# Patient Record
Sex: Female | Born: 1935 | Race: White | Hispanic: No | Smoking: Former smoker
Health system: Southern US, Community
[De-identification: ages and names within clinical notes are randomized; demographics above are authoritative.]

## PROBLEM LIST (undated history)

## (undated) DIAGNOSIS — J449 Chronic obstructive pulmonary disease, unspecified: Secondary | ICD-10-CM

## (undated) DIAGNOSIS — E785 Hyperlipidemia, unspecified: Secondary | ICD-10-CM

## (undated) DIAGNOSIS — J45909 Unspecified asthma, uncomplicated: Secondary | ICD-10-CM

## (undated) DIAGNOSIS — W19XXXA Unspecified fall, initial encounter: Secondary | ICD-10-CM

## (undated) DIAGNOSIS — I2510 Atherosclerotic heart disease of native coronary artery without angina pectoris: Secondary | ICD-10-CM

## (undated) DIAGNOSIS — I200 Unstable angina: Secondary | ICD-10-CM

## (undated) DIAGNOSIS — E039 Hypothyroidism, unspecified: Secondary | ICD-10-CM

## (undated) DIAGNOSIS — I509 Heart failure, unspecified: Secondary | ICD-10-CM

## (undated) DIAGNOSIS — R451 Restlessness and agitation: Secondary | ICD-10-CM

## (undated) HISTORY — PX: APPENDECTOMY: SHX54

## (undated) HISTORY — PX: EYE SURGERY: SHX253

## (undated) HISTORY — PX: ABDOMINAL HYSTERECTOMY: SHX81

## (undated) HISTORY — PX: ANKLE SURGERY: SHX546

---

## 2004-03-06 ENCOUNTER — Inpatient Hospital Stay: Payer: Self-pay | Admitting: Anesthesiology

## 2004-09-04 ENCOUNTER — Inpatient Hospital Stay: Payer: Self-pay | Admitting: Internal Medicine

## 2005-02-18 ENCOUNTER — Ambulatory Visit: Payer: Self-pay | Admitting: Podiatry

## 2005-11-02 ENCOUNTER — Inpatient Hospital Stay: Payer: Self-pay | Admitting: Internal Medicine

## 2005-11-02 ENCOUNTER — Other Ambulatory Visit: Payer: Self-pay

## 2007-02-21 ENCOUNTER — Ambulatory Visit: Payer: Self-pay | Admitting: Ophthalmology

## 2007-03-28 ENCOUNTER — Ambulatory Visit: Payer: Self-pay | Admitting: Ophthalmology

## 2008-01-12 ENCOUNTER — Inpatient Hospital Stay: Payer: Self-pay | Admitting: Internal Medicine

## 2008-11-12 ENCOUNTER — Ambulatory Visit: Payer: Self-pay | Admitting: Family Medicine

## 2008-11-17 ENCOUNTER — Inpatient Hospital Stay: Payer: Self-pay | Admitting: Internal Medicine

## 2009-11-24 ENCOUNTER — Inpatient Hospital Stay: Payer: Self-pay | Admitting: Internal Medicine

## 2009-12-12 ENCOUNTER — Ambulatory Visit: Payer: Self-pay | Admitting: Cardiovascular Disease

## 2010-12-30 ENCOUNTER — Observation Stay: Payer: Self-pay | Admitting: Internal Medicine

## 2011-01-11 ENCOUNTER — Inpatient Hospital Stay: Payer: Self-pay | Admitting: Internal Medicine

## 2011-01-14 LAB — WBC: WBC: 12.7 10*3/uL — ABNORMAL HIGH (ref 3.6–11.0)

## 2011-05-11 LAB — HM DEXA SCAN

## 2011-05-27 ENCOUNTER — Inpatient Hospital Stay: Payer: Self-pay | Admitting: Internal Medicine

## 2011-05-27 LAB — BASIC METABOLIC PANEL
Anion Gap: 7 (ref 7–16)
BUN: 11 mg/dL (ref 7–18)
Calcium, Total: 8.8 mg/dL (ref 8.5–10.1)
Chloride: 107 mmol/L (ref 98–107)
Creatinine: 0.71 mg/dL (ref 0.60–1.30)
EGFR (African American): >60
EGFR (Non-African Amer.): >60
Osmolality: 282 (ref 275–301)
Sodium: 142 mmol/L (ref 136–145)

## 2011-05-27 LAB — CK TOTAL AND CKMB (NOT AT ARMC): CK, Total: 145 U/L (ref 21–215)

## 2011-05-27 LAB — CBC
HCT: 43.4 % (ref 35.0–47.0)
MCH: 28.4 pg (ref 26.0–34.0)
MCV: 86 fL (ref 80–100)
Platelet: 284 10*3/uL (ref 150–440)
RBC: 5.07 10*6/uL (ref 3.80–5.20)
RDW: 13.4 % (ref 11.5–14.5)

## 2011-05-27 LAB — TROPONIN I: Troponin-I: <0.02 ng/mL

## 2011-05-28 LAB — CBC WITH DIFFERENTIAL/PLATELET
Eosinophil #: 0 10*3/uL (ref 0.0–0.7)
HCT: 41.7 % (ref 35.0–47.0)
Lymphocyte #: 0.5 10*3/uL — ABNORMAL LOW (ref 1.0–3.6)
Lymphocyte %: 6.3 %
MCH: 28.3 pg (ref 26.0–34.0)
Neutrophil #: 7.7 10*3/uL — ABNORMAL HIGH (ref 1.4–6.5)
Neutrophil %: 92.7 %
Platelet: 298 10*3/uL (ref 150–440)
RBC: 4.87 10*6/uL (ref 3.80–5.20)
WBC: 8.3 10*3/uL (ref 3.6–11.0)

## 2011-05-28 LAB — BASIC METABOLIC PANEL
Anion Gap: 8 (ref 7–16)
BUN: 11 mg/dL (ref 7–18)
Calcium, Total: 8.6 mg/dL (ref 8.5–10.1)
Co2: 27 mmol/L (ref 21–32)
Creatinine: 0.83 mg/dL (ref 0.60–1.30)
EGFR (African American): >60
Glucose: 165 mg/dL — ABNORMAL HIGH (ref 65–99)
Sodium: 142 mmol/L (ref 136–145)

## 2011-06-02 LAB — CULTURE, BLOOD (SINGLE)

## 2011-08-13 ENCOUNTER — Emergency Department: Payer: Self-pay | Admitting: Emergency Medicine

## 2011-08-13 LAB — COMPREHENSIVE METABOLIC PANEL
Alkaline Phosphatase: 86 U/L (ref 50–136)
Anion Gap: 10 (ref 7–16)
Calcium, Total: 9.1 mg/dL (ref 8.5–10.1)
Chloride: 107 mmol/L (ref 98–107)
Co2: 25 mmol/L (ref 21–32)
Creatinine: 0.86 mg/dL (ref 0.60–1.30)
EGFR (African American): >60
EGFR (Non-African Amer.): >60
Osmolality: 282 (ref 275–301)
Potassium: 3.1 mmol/L — ABNORMAL LOW (ref 3.5–5.1)
Sodium: 142 mmol/L (ref 136–145)

## 2011-08-13 LAB — CBC
HGB: 13.5 g/dL (ref 12.0–16.0)
MCH: 28.5 pg (ref 26.0–34.0)
Platelet: 278 10*3/uL (ref 150–440)
RBC: 4.75 10*6/uL (ref 3.80–5.20)
WBC: 10.9 10*3/uL (ref 3.6–11.0)

## 2012-02-27 ENCOUNTER — Inpatient Hospital Stay: Payer: Self-pay | Admitting: Specialist

## 2012-02-27 LAB — COMPREHENSIVE METABOLIC PANEL
Albumin: 3.6 g/dL (ref 3.4–5.0)
Alkaline Phosphatase: 112 U/L (ref 50–136)
Bilirubin,Total: 0.6 mg/dL (ref 0.2–1.0)
Calcium, Total: 9.2 mg/dL (ref 8.5–10.1)
Co2: 28 mmol/L (ref 21–32)
Creatinine: 0.88 mg/dL (ref 0.60–1.30)
EGFR (African American): >60
Glucose: 86 mg/dL (ref 65–99)
Potassium: 3.6 mmol/L (ref 3.5–5.1)
SGPT (ALT): 16 U/L (ref 12–78)

## 2012-02-27 LAB — TROPONIN I: Troponin-I: <0.02 ng/mL

## 2012-02-27 LAB — PRO B NATRIURETIC PEPTIDE: B-Type Natriuretic Peptide: 484 pg/mL — ABNORMAL HIGH (ref 0–125)

## 2012-02-27 LAB — CBC
HCT: 44.3 % (ref 35.0–47.0)
HGB: 14.6 g/dL (ref 12.0–16.0)
MCH: 27.2 pg (ref 26.0–34.0)
MCV: 83 fL (ref 80–100)
Platelet: 329 10*3/uL (ref 150–440)
WBC: 16.4 10*3/uL — ABNORMAL HIGH (ref 3.6–11.0)

## 2012-02-27 LAB — CK TOTAL AND CKMB (NOT AT ARMC)
CK, Total: 57 U/L (ref 21–215)
CK-MB: 0.5 ng/mL (ref 0.5–3.6)

## 2012-02-28 DIAGNOSIS — I509 Heart failure, unspecified: Secondary | ICD-10-CM

## 2012-02-28 LAB — CBC WITH DIFFERENTIAL/PLATELET
Basophil #: 0 10*3/uL (ref 0.0–0.1)
Basophil %: 0.2 %
Eosinophil %: 0 %
HCT: 38.6 % (ref 35.0–47.0)
Lymphocyte #: 0.9 10*3/uL — ABNORMAL LOW (ref 1.0–3.6)
Lymphocyte %: 4.7 %
MCHC: 32.5 g/dL (ref 32.0–36.0)
MCV: 82 fL (ref 80–100)
Monocyte %: 2.4 %
Neutrophil #: 17.6 10*3/uL — ABNORMAL HIGH (ref 1.4–6.5)
Neutrophil %: 92.7 %
Platelet: 293 10*3/uL (ref 150–440)
RBC: 4.73 10*6/uL (ref 3.80–5.20)
RDW: 14.6 % — ABNORMAL HIGH (ref 11.5–14.5)

## 2012-02-28 LAB — BASIC METABOLIC PANEL
BUN: 18 mg/dL (ref 7–18)
Calcium, Total: 9.1 mg/dL (ref 8.5–10.1)
Chloride: 101 mmol/L (ref 98–107)
Co2: 27 mmol/L (ref 21–32)
Creatinine: 0.86 mg/dL (ref 0.60–1.30)
Sodium: 137 mmol/L (ref 136–145)

## 2012-03-01 LAB — CBC WITH DIFFERENTIAL/PLATELET
Basophil %: 0.2 %
Eosinophil #: 0 10*3/uL (ref 0.0–0.7)
Eosinophil %: 0 %
HGB: 12.7 g/dL (ref 12.0–16.0)
Lymphocyte %: 6.8 %
MCH: 27.1 pg (ref 26.0–34.0)
MCHC: 32.7 g/dL (ref 32.0–36.0)
MCV: 83 fL (ref 80–100)
Neutrophil #: 14.2 10*3/uL — ABNORMAL HIGH (ref 1.4–6.5)
Neutrophil %: 89.1 %
Platelet: 357 10*3/uL (ref 150–440)

## 2012-11-01 ENCOUNTER — Inpatient Hospital Stay: Payer: Self-pay | Admitting: Internal Medicine

## 2012-11-01 LAB — CBC
HGB: 13.5 g/dL (ref 12.0–16.0)
MCH: 27 pg (ref 26.0–34.0)
MCHC: 32.9 g/dL (ref 32.0–36.0)
Platelet: 310 10*3/uL (ref 150–440)
RBC: 4.98 10*6/uL (ref 3.80–5.20)
RDW: 15.9 % — ABNORMAL HIGH (ref 11.5–14.5)
WBC: 18.1 10*3/uL — ABNORMAL HIGH (ref 3.6–11.0)

## 2012-11-01 LAB — URINALYSIS, COMPLETE
Bilirubin,UR: NEGATIVE
Ketone: NEGATIVE
Nitrite: NEGATIVE
Ph: 6 (ref 4.5–8.0)
WBC UR: 1 /HPF (ref 0–5)

## 2012-11-01 LAB — COMPREHENSIVE METABOLIC PANEL
Albumin: 3.3 g/dL — ABNORMAL LOW (ref 3.4–5.0)
Anion Gap: 5 — ABNORMAL LOW (ref 7–16)
Bilirubin,Total: 0.7 mg/dL (ref 0.2–1.0)
Chloride: 106 mmol/L (ref 98–107)
Glucose: 111 mg/dL — ABNORMAL HIGH (ref 65–99)
Osmolality: 276 (ref 275–301)
Potassium: 3.9 mmol/L (ref 3.5–5.1)
SGOT(AST): 39 U/L — ABNORMAL HIGH (ref 15–37)
SGPT (ALT): 25 U/L (ref 12–78)
Sodium: 138 mmol/L (ref 136–145)
Total Protein: 7.1 g/dL (ref 6.4–8.2)

## 2012-11-01 LAB — DIFFERENTIAL
Basophil #: 0.2 10*3/uL — ABNORMAL HIGH (ref 0.0–0.1)
Eosinophil #: 0.2 10*3/uL (ref 0.0–0.7)
Lymphocyte #: 3.5 10*3/uL (ref 1.0–3.6)
Lymphocyte %: 19.4 %
Monocyte %: 6.3 %
Neutrophil #: 13 10*3/uL — ABNORMAL HIGH (ref 1.4–6.5)
Neutrophil %: 71.8 %

## 2012-11-01 LAB — TROPONIN I: Troponin-I: <0.02 ng/mL

## 2012-11-01 LAB — MAGNESIUM: Magnesium: 1.7 mg/dL — ABNORMAL LOW

## 2012-11-01 LAB — CALCIUM: Calcium, Total: 8.9 mg/dL (ref 8.5–10.1)

## 2012-11-02 LAB — WBC: WBC: 13.5 10*3/uL — ABNORMAL HIGH (ref 3.6–11.0)

## 2012-11-06 LAB — CULTURE, BLOOD (SINGLE)

## 2012-12-08 ENCOUNTER — Inpatient Hospital Stay: Payer: Self-pay | Admitting: Internal Medicine

## 2012-12-08 LAB — COMPREHENSIVE METABOLIC PANEL
Alkaline Phosphatase: 75 U/L
Anion Gap: 6 — ABNORMAL LOW (ref 7–16)
BUN: 25 mg/dL — ABNORMAL HIGH (ref 7–18)
Chloride: 104 mmol/L (ref 98–107)
Co2: 32 mmol/L (ref 21–32)
EGFR (African American): >60
EGFR (Non-African Amer.): >60
Glucose: 117 mg/dL — ABNORMAL HIGH (ref 65–99)
Osmolality: 289 (ref 275–301)
Potassium: 3.3 mmol/L — ABNORMAL LOW (ref 3.5–5.1)
SGOT(AST): 25 U/L (ref 15–37)
Sodium: 142 mmol/L (ref 136–145)
Total Protein: 6.9 g/dL (ref 6.4–8.2)

## 2012-12-08 LAB — CBC WITH DIFFERENTIAL/PLATELET
Eosinophil #: 0.1 10*3/uL (ref 0.0–0.7)
HCT: 40.7 % (ref 35.0–47.0)
Lymphocyte #: 2.7 10*3/uL (ref 1.0–3.6)
MCH: 27.5 pg (ref 26.0–34.0)
MCHC: 33 g/dL (ref 32.0–36.0)
MCV: 83 fL (ref 80–100)
Monocyte %: 5.2 %
Neutrophil #: 20 10*3/uL — ABNORMAL HIGH (ref 1.4–6.5)
Neutrophil %: 82.5 %
Platelet: 381 10*3/uL (ref 150–440)
RBC: 4.88 10*6/uL (ref 3.80–5.20)
WBC: 24.3 10*3/uL — ABNORMAL HIGH (ref 3.6–11.0)

## 2012-12-08 LAB — CK TOTAL AND CKMB (NOT AT ARMC)
CK, Total: 72 U/L (ref 21–215)
CK-MB: 1.2 ng/mL (ref 0.5–3.6)

## 2012-12-08 LAB — THEOPHYLLINE LEVEL: Theophylline: <2 ug/mL — ABNORMAL LOW (ref 10.0–20.0)

## 2012-12-09 LAB — BASIC METABOLIC PANEL
BUN: 22 mg/dL — ABNORMAL HIGH (ref 7–18)
Chloride: 102 mmol/L (ref 98–107)
Co2: 32 mmol/L (ref 21–32)
Creatinine: 1.15 mg/dL (ref 0.60–1.30)
EGFR (African American): 54 — ABNORMAL LOW
EGFR (Non-African Amer.): 47 — ABNORMAL LOW
Glucose: 206 mg/dL — ABNORMAL HIGH (ref 65–99)
Osmolality: 285 (ref 275–301)
Sodium: 138 mmol/L (ref 136–145)

## 2012-12-09 LAB — CBC WITH DIFFERENTIAL/PLATELET
Basophil #: 0 10*3/uL (ref 0.0–0.1)
Basophil %: 0.1 %
Eosinophil #: 0 10*3/uL (ref 0.0–0.7)
HGB: 12.2 g/dL (ref 12.0–16.0)
Lymphocyte #: 1 10*3/uL (ref 1.0–3.6)
MCHC: 33.4 g/dL (ref 32.0–36.0)
Monocyte #: 1.1 x10 3/mm — ABNORMAL HIGH (ref 0.2–0.9)
Monocyte %: 3.6 %
Neutrophil %: 92.8 %
RBC: 4.45 10*6/uL (ref 3.80–5.20)
RDW: 15.8 % — ABNORMAL HIGH (ref 11.5–14.5)
WBC: 29.9 10*3/uL — ABNORMAL HIGH (ref 3.6–11.0)

## 2012-12-10 LAB — WBC: WBC: 24.5 10*3/uL — ABNORMAL HIGH (ref 3.6–11.0)

## 2012-12-10 LAB — POTASSIUM: Potassium: 3 mmol/L — ABNORMAL LOW (ref 3.5–5.1)

## 2012-12-11 LAB — CREATININE, SERUM
Creatinine: 1.13 mg/dL (ref 0.60–1.30)
EGFR (African American): 55 — ABNORMAL LOW

## 2012-12-11 LAB — POTASSIUM: Potassium: 4 mmol/L (ref 3.5–5.1)

## 2012-12-13 LAB — EXPECTORATED SPUTUM ASSESSMENT W GRAM STAIN, RFLX TO RESP C

## 2012-12-13 LAB — CULTURE, BLOOD (SINGLE)

## 2012-12-14 ENCOUNTER — Ambulatory Visit: Payer: Self-pay | Admitting: Internal Medicine

## 2013-02-05 ENCOUNTER — Observation Stay: Payer: Self-pay | Admitting: Internal Medicine

## 2013-02-05 LAB — BASIC METABOLIC PANEL
Anion Gap: 3 — ABNORMAL LOW (ref 7–16)
BUN: 18 mg/dL (ref 7–18)
CHLORIDE: 104 mmol/L (ref 98–107)
CO2: 32 mmol/L (ref 21–32)
Calcium, Total: 8.8 mg/dL (ref 8.5–10.1)
Creatinine: 0.83 mg/dL (ref 0.60–1.30)
EGFR (Non-African Amer.): >60
Glucose: 205 mg/dL — ABNORMAL HIGH (ref 65–99)
Osmolality: 285 (ref 275–301)
Potassium: 3.7 mmol/L (ref 3.5–5.1)
SODIUM: 139 mmol/L (ref 136–145)

## 2013-02-05 LAB — CBC
HCT: 37.9 % (ref 35.0–47.0)
HGB: 12.5 g/dL (ref 12.0–16.0)
MCH: 27.8 pg (ref 26.0–34.0)
MCHC: 33 g/dL (ref 32.0–36.0)
MCV: 84 fL (ref 80–100)
Platelet: 339 10*3/uL (ref 150–440)
RBC: 4.51 10*6/uL (ref 3.80–5.20)
RDW: 16 % — ABNORMAL HIGH (ref 11.5–14.5)
WBC: 9.3 10*3/uL (ref 3.6–11.0)

## 2013-02-05 LAB — TROPONIN I: Troponin-I: <0.02 ng/mL

## 2013-02-06 LAB — BASIC METABOLIC PANEL
Anion Gap: 7 (ref 7–16)
BUN: 16 mg/dL (ref 7–18)
CALCIUM: 9 mg/dL (ref 8.5–10.1)
CREATININE: 0.91 mg/dL (ref 0.60–1.30)
Chloride: 105 mmol/L (ref 98–107)
Co2: 29 mmol/L (ref 21–32)
EGFR (African American): >60
GLUCOSE: 121 mg/dL — AB (ref 65–99)
OSMOLALITY: 284 (ref 275–301)
POTASSIUM: 3.6 mmol/L (ref 3.5–5.1)
Sodium: 141 mmol/L (ref 136–145)

## 2013-02-06 LAB — CBC WITH DIFFERENTIAL/PLATELET
Basophil #: 0 10*3/uL (ref 0.0–0.1)
Basophil %: 0.1 %
EOS ABS: 0 10*3/uL (ref 0.0–0.7)
EOS PCT: 0 %
HCT: 35.5 % (ref 35.0–47.0)
HGB: 11.8 g/dL — AB (ref 12.0–16.0)
Lymphocyte #: 1 10*3/uL (ref 1.0–3.6)
Lymphocyte %: 10.6 %
MCH: 27.8 pg (ref 26.0–34.0)
MCHC: 33.2 g/dL (ref 32.0–36.0)
MCV: 84 fL (ref 80–100)
Monocyte #: 0.2 x10 3/mm (ref 0.2–0.9)
Monocyte %: 2.6 %
NEUTROS ABS: 7.9 10*3/uL — AB (ref 1.4–6.5)
NEUTROS PCT: 86.7 %
Platelet: 328 10*3/uL (ref 150–440)
RBC: 4.23 10*6/uL (ref 3.80–5.20)
RDW: 15.7 % — ABNORMAL HIGH (ref 11.5–14.5)
WBC: 9.1 10*3/uL (ref 3.6–11.0)

## 2013-02-06 LAB — TSH: THYROID STIMULATING HORM: 3.26 u[IU]/mL

## 2013-02-06 LAB — THEOPHYLLINE LEVEL: Theophylline: <2 ug/mL — ABNORMAL LOW (ref 10.0–20.0)

## 2013-02-08 ENCOUNTER — Inpatient Hospital Stay: Payer: Self-pay | Admitting: Internal Medicine

## 2013-02-08 LAB — COMPREHENSIVE METABOLIC PANEL
ANION GAP: 7 (ref 7–16)
AST: 32 U/L (ref 15–37)
Albumin: 3.4 g/dL (ref 3.4–5.0)
Alkaline Phosphatase: 72 U/L
BUN: 27 mg/dL — ABNORMAL HIGH (ref 7–18)
Bilirubin,Total: 0.5 mg/dL (ref 0.2–1.0)
CALCIUM: 8.8 mg/dL (ref 8.5–10.1)
CHLORIDE: 105 mmol/L (ref 98–107)
CREATININE: 0.72 mg/dL (ref 0.60–1.30)
Co2: 28 mmol/L (ref 21–32)
EGFR (Non-African Amer.): >60
GLUCOSE: 117 mg/dL — AB (ref 65–99)
OSMOLALITY: 286 (ref 275–301)
Potassium: 4 mmol/L (ref 3.5–5.1)
SGPT (ALT): 23 U/L (ref 12–78)
Sodium: 140 mmol/L (ref 136–145)
TOTAL PROTEIN: 7.2 g/dL (ref 6.4–8.2)

## 2013-02-08 LAB — CBC
HCT: 40.8 % (ref 35.0–47.0)
HGB: 13.5 g/dL (ref 12.0–16.0)
MCH: 28 pg (ref 26.0–34.0)
MCHC: 33 g/dL (ref 32.0–36.0)
MCV: 85 fL (ref 80–100)
PLATELETS: 372 10*3/uL (ref 150–440)
RBC: 4.81 10*6/uL (ref 3.80–5.20)
RDW: 16 % — ABNORMAL HIGH (ref 11.5–14.5)
WBC: 14.8 10*3/uL — ABNORMAL HIGH (ref 3.6–11.0)

## 2013-02-08 LAB — EXPECTORATED SPUTUM ASSESSMENT W GRAM STAIN, RFLX TO RESP C

## 2013-02-08 LAB — THEOPHYLLINE LEVEL: Theophylline: 2.5 ug/mL — ABNORMAL LOW (ref 10.0–20.0)

## 2013-02-12 LAB — PLATELET COUNT: Platelet: 311 10*3/uL (ref 150–440)

## 2013-02-14 LAB — BASIC METABOLIC PANEL
Anion Gap: 2 — ABNORMAL LOW (ref 7–16)
BUN: 40 mg/dL — AB (ref 7–18)
CHLORIDE: 102 mmol/L (ref 98–107)
CREATININE: 1.02 mg/dL (ref 0.60–1.30)
Calcium, Total: 8.8 mg/dL (ref 8.5–10.1)
Co2: 34 mmol/L — ABNORMAL HIGH (ref 21–32)
GFR CALC NON AF AMER: 54 — AB
Glucose: 225 mg/dL — ABNORMAL HIGH (ref 65–99)
OSMOLALITY: 292 (ref 275–301)
POTASSIUM: 4.6 mmol/L (ref 3.5–5.1)
Sodium: 138 mmol/L (ref 136–145)

## 2013-02-15 ENCOUNTER — Ambulatory Visit: Payer: Self-pay | Admitting: Internal Medicine

## 2013-02-16 LAB — PLATELET COUNT: Platelet: 304 10*3/uL (ref 150–440)

## 2013-02-18 LAB — CBC WITH DIFFERENTIAL/PLATELET
BASOS ABS: 0 10*3/uL (ref 0.0–0.1)
BASOS PCT: 0.2 %
Eosinophil #: 0 10*3/uL (ref 0.0–0.7)
Eosinophil %: 0 %
HCT: 39.7 % (ref 35.0–47.0)
HGB: 12.7 g/dL (ref 12.0–16.0)
LYMPHS ABS: 0.6 10*3/uL — AB (ref 1.0–3.6)
Lymphocyte %: 3.5 %
MCH: 27 pg (ref 26.0–34.0)
MCHC: 31.9 g/dL — ABNORMAL LOW (ref 32.0–36.0)
MCV: 85 fL (ref 80–100)
Monocyte #: 0.6 x10 3/mm (ref 0.2–0.9)
Monocyte %: 3.5 %
NEUTROS ABS: 15.7 10*3/uL — AB (ref 1.4–6.5)
NEUTROS PCT: 92.8 %
Platelet: 266 10*3/uL (ref 150–440)
RBC: 4.7 10*6/uL (ref 3.80–5.20)
RDW: 15.8 % — ABNORMAL HIGH (ref 11.5–14.5)
WBC: 16.9 10*3/uL — AB (ref 3.6–11.0)

## 2013-02-18 LAB — BASIC METABOLIC PANEL
Anion Gap: 6 — ABNORMAL LOW (ref 7–16)
BUN: 34 mg/dL — ABNORMAL HIGH (ref 7–18)
CO2: 31 mmol/L (ref 21–32)
CREATININE: 0.89 mg/dL (ref 0.60–1.30)
Calcium, Total: 8.5 mg/dL (ref 8.5–10.1)
Chloride: 102 mmol/L (ref 98–107)
EGFR (Non-African Amer.): >60
Glucose: 176 mg/dL — ABNORMAL HIGH (ref 65–99)
Osmolality: 289 (ref 275–301)
Potassium: 4.6 mmol/L (ref 3.5–5.1)
Sodium: 139 mmol/L (ref 136–145)

## 2013-02-19 LAB — THEOPHYLLINE LEVEL: Theophylline: <2 ug/mL — ABNORMAL LOW (ref 10.0–20.0)

## 2013-02-22 LAB — PLATELET COUNT: Platelet: 178 10*3/uL (ref 150–440)

## 2013-03-05 ENCOUNTER — Inpatient Hospital Stay: Payer: Self-pay | Admitting: Internal Medicine

## 2013-03-05 LAB — CK TOTAL AND CKMB (NOT AT ARMC)
CK, TOTAL: 31 U/L
CK-MB: 1.8 ng/mL (ref 0.5–3.6)

## 2013-03-05 LAB — COMPREHENSIVE METABOLIC PANEL
ALK PHOS: 64 U/L
ANION GAP: 3 — AB (ref 7–16)
Albumin: 3.4 g/dL (ref 3.4–5.0)
BUN: 21 mg/dL — ABNORMAL HIGH (ref 7–18)
Bilirubin,Total: 0.4 mg/dL (ref 0.2–1.0)
Calcium, Total: 8.8 mg/dL (ref 8.5–10.1)
Chloride: 103 mmol/L (ref 98–107)
Co2: 33 mmol/L — ABNORMAL HIGH (ref 21–32)
Creatinine: 0.86 mg/dL (ref 0.60–1.30)
EGFR (African American): >60
Glucose: 88 mg/dL (ref 65–99)
Osmolality: 280 (ref 275–301)
POTASSIUM: 3.7 mmol/L (ref 3.5–5.1)
SGOT(AST): 27 U/L (ref 15–37)
SGPT (ALT): 36 U/L (ref 12–78)
Sodium: 139 mmol/L (ref 136–145)
Total Protein: 6.6 g/dL (ref 6.4–8.2)

## 2013-03-05 LAB — TROPONIN I

## 2013-03-05 LAB — PRO B NATRIURETIC PEPTIDE: B-TYPE NATIURETIC PEPTID: 310 pg/mL (ref 0–450)

## 2013-03-05 LAB — CBC
HCT: 39 % (ref 35.0–47.0)
HGB: 12.8 g/dL (ref 12.0–16.0)
MCH: 27.9 pg (ref 26.0–34.0)
MCHC: 32.8 g/dL (ref 32.0–36.0)
MCV: 85 fL (ref 80–100)
Platelet: 226 10*3/uL (ref 150–440)
RBC: 4.59 10*6/uL (ref 3.80–5.20)
RDW: 16.1 % — ABNORMAL HIGH (ref 11.5–14.5)
WBC: 8.9 10*3/uL (ref 3.6–11.0)

## 2013-03-06 LAB — BASIC METABOLIC PANEL
ANION GAP: 12 (ref 7–16)
BUN: 23 mg/dL — AB (ref 7–18)
CREATININE: 1.07 mg/dL (ref 0.60–1.30)
Calcium, Total: 8.8 mg/dL (ref 8.5–10.1)
Chloride: 97 mmol/L — ABNORMAL LOW (ref 98–107)
Co2: 30 mmol/L (ref 21–32)
EGFR (African American): 59 — ABNORMAL LOW
EGFR (Non-African Amer.): 51 — ABNORMAL LOW
Glucose: 211 mg/dL — ABNORMAL HIGH (ref 65–99)
OSMOLALITY: 287 (ref 275–301)
Potassium: 4 mmol/L (ref 3.5–5.1)
SODIUM: 139 mmol/L (ref 136–145)

## 2013-03-06 LAB — CBC WITH DIFFERENTIAL/PLATELET
Basophil #: 0.1 10*3/uL (ref 0.0–0.1)
Basophil %: 1.2 %
EOS PCT: 0 %
Eosinophil #: 0 10*3/uL (ref 0.0–0.7)
HCT: 36.7 % (ref 35.0–47.0)
HGB: 12.2 g/dL (ref 12.0–16.0)
Lymphocyte #: 0.5 10*3/uL — ABNORMAL LOW (ref 1.0–3.6)
Lymphocyte %: 6.1 %
MCH: 27.8 pg (ref 26.0–34.0)
MCHC: 33.2 g/dL (ref 32.0–36.0)
MCV: 84 fL (ref 80–100)
MONOS PCT: 1 %
Monocyte #: 0.1 x10 3/mm — ABNORMAL LOW (ref 0.2–0.9)
NEUTROS PCT: 91.7 %
Neutrophil #: 8.1 10*3/uL — ABNORMAL HIGH (ref 1.4–6.5)
PLATELETS: 234 10*3/uL (ref 150–440)
RBC: 4.39 10*6/uL (ref 3.80–5.20)
RDW: 16.5 % — ABNORMAL HIGH (ref 11.5–14.5)
WBC: 8.8 10*3/uL (ref 3.6–11.0)

## 2013-03-06 LAB — CK TOTAL AND CKMB (NOT AT ARMC)
CK, Total: 28 U/L
CK, Total: 24 U/L — ABNORMAL LOW
CK-MB: 1.8 ng/mL (ref 0.5–3.6)
CK-MB: 1.8 ng/mL (ref 0.5–3.6)

## 2013-03-06 LAB — THEOPHYLLINE LEVEL: Theophylline: <2 ug/mL — ABNORMAL LOW (ref 10.0–20.0)

## 2013-03-06 LAB — TROPONIN I: Troponin-I: <0.02 ng/mL

## 2013-03-07 LAB — BASIC METABOLIC PANEL
Anion Gap: 5 — ABNORMAL LOW (ref 7–16)
BUN: 30 mg/dL — ABNORMAL HIGH (ref 7–18)
CHLORIDE: 95 mmol/L — AB (ref 98–107)
CREATININE: 0.95 mg/dL (ref 0.60–1.30)
Calcium, Total: 8.8 mg/dL (ref 8.5–10.1)
Co2: 35 mmol/L — ABNORMAL HIGH (ref 21–32)
EGFR (African American): >60
EGFR (Non-African Amer.): 59 — ABNORMAL LOW
Glucose: 161 mg/dL — ABNORMAL HIGH (ref 65–99)
OSMOLALITY: 280 (ref 275–301)
Potassium: 3.8 mmol/L (ref 3.5–5.1)
Sodium: 135 mmol/L — ABNORMAL LOW (ref 136–145)

## 2013-03-07 LAB — PRO B NATRIURETIC PEPTIDE: B-Type Natriuretic Peptide: 406 pg/mL (ref 0–450)

## 2013-03-08 ENCOUNTER — Ambulatory Visit (HOSPITAL_COMMUNITY)
Admission: AD | Admit: 2013-03-08 | Discharge: 2013-03-08 | Disposition: A | Discharge status: Home / Self Care | Payer: Medicare Other | Source: Other Acute Inpatient Hospital | Attending: Internal Medicine | Admitting: Internal Medicine

## 2013-03-08 DIAGNOSIS — J449 Chronic obstructive pulmonary disease, unspecified: Secondary | ICD-10-CM | POA: Insufficient documentation

## 2013-03-08 DIAGNOSIS — I2510 Atherosclerotic heart disease of native coronary artery without angina pectoris: Secondary | ICD-10-CM | POA: Insufficient documentation

## 2013-03-08 DIAGNOSIS — J4489 Other specified chronic obstructive pulmonary disease: Secondary | ICD-10-CM | POA: Insufficient documentation

## 2013-03-08 DIAGNOSIS — E039 Hypothyroidism, unspecified: Secondary | ICD-10-CM | POA: Insufficient documentation

## 2013-03-08 LAB — BASIC METABOLIC PANEL
ANION GAP: 5 — AB (ref 7–16)
BUN: 42 mg/dL — ABNORMAL HIGH (ref 7–18)
CO2: 35 mmol/L — AB (ref 21–32)
Calcium, Total: 8.5 mg/dL (ref 8.5–10.1)
Chloride: 96 mmol/L — ABNORMAL LOW (ref 98–107)
Creatinine: 0.95 mg/dL (ref 0.60–1.30)
EGFR (African American): >60
GFR CALC NON AF AMER: 59 — AB
Glucose: 161 mg/dL — ABNORMAL HIGH (ref 65–99)
Osmolality: 286 (ref 275–301)
POTASSIUM: 3.4 mmol/L — AB (ref 3.5–5.1)
Sodium: 136 mmol/L (ref 136–145)

## 2013-03-09 DIAGNOSIS — I200 Unstable angina: Secondary | ICD-10-CM | POA: Insufficient documentation

## 2013-03-09 DIAGNOSIS — J449 Chronic obstructive pulmonary disease, unspecified: Secondary | ICD-10-CM | POA: Insufficient documentation

## 2013-03-09 DIAGNOSIS — I2510 Atherosclerotic heart disease of native coronary artery without angina pectoris: Secondary | ICD-10-CM | POA: Insufficient documentation

## 2013-03-10 LAB — CULTURE, BLOOD (SINGLE)

## 2013-03-12 ENCOUNTER — Ambulatory Visit: Payer: Self-pay | Admitting: Internal Medicine

## 2013-04-23 DIAGNOSIS — J441 Chronic obstructive pulmonary disease with (acute) exacerbation: Secondary | ICD-10-CM | POA: Insufficient documentation

## 2013-04-26 DIAGNOSIS — R451 Restlessness and agitation: Secondary | ICD-10-CM | POA: Insufficient documentation

## 2013-05-13 DIAGNOSIS — D62 Acute posthemorrhagic anemia: Secondary | ICD-10-CM | POA: Insufficient documentation

## 2013-08-12 ENCOUNTER — Ambulatory Visit: Payer: Self-pay | Admitting: Internal Medicine

## 2013-08-29 ENCOUNTER — Inpatient Hospital Stay: Payer: Self-pay | Admitting: Internal Medicine

## 2013-08-29 LAB — CBC WITH DIFFERENTIAL/PLATELET
BASOS PCT: 0.1 %
Basophil #: 0 10*3/uL (ref 0.0–0.1)
EOS ABS: 0 10*3/uL (ref 0.0–0.7)
Eosinophil %: 0 %
HCT: 38.7 % (ref 35.0–47.0)
HGB: 12.4 g/dL (ref 12.0–16.0)
LYMPHS ABS: 0.5 10*3/uL — AB (ref 1.0–3.6)
LYMPHS PCT: 3.5 %
MCH: 24.8 pg — AB (ref 26.0–34.0)
MCHC: 32.2 g/dL (ref 32.0–36.0)
MCV: 77 fL — AB (ref 80–100)
MONO ABS: 0.4 x10 3/mm (ref 0.2–0.9)
Monocyte %: 2.6 %
NEUTROS ABS: 14.6 10*3/uL — AB (ref 1.4–6.5)
NEUTROS PCT: 93.8 %
PLATELETS: 343 10*3/uL (ref 150–440)
RBC: 5.02 10*6/uL (ref 3.80–5.20)
RDW: 16.9 % — ABNORMAL HIGH (ref 11.5–14.5)
WBC: 15.6 10*3/uL — AB (ref 3.6–11.0)

## 2013-08-29 LAB — COMPREHENSIVE METABOLIC PANEL
ALT: 15 U/L
Albumin: 3.5 g/dL (ref 3.4–5.0)
Alkaline Phosphatase: 84 U/L
Anion Gap: 9 (ref 7–16)
BUN: 13 mg/dL (ref 7–18)
Bilirubin,Total: 0.7 mg/dL (ref 0.2–1.0)
CALCIUM: 8.8 mg/dL (ref 8.5–10.1)
CHLORIDE: 95 mmol/L — AB (ref 98–107)
Co2: 36 mmol/L — ABNORMAL HIGH (ref 21–32)
Creatinine: 0.93 mg/dL (ref 0.60–1.30)
EGFR (African American): >60
EGFR (Non-African Amer.): 60 — ABNORMAL LOW
Glucose: 117 mg/dL — ABNORMAL HIGH (ref 65–99)
Osmolality: 281 (ref 275–301)
POTASSIUM: 3 mmol/L — AB (ref 3.5–5.1)
SGOT(AST): 28 U/L (ref 15–37)
SODIUM: 140 mmol/L (ref 136–145)
Total Protein: 8.1 g/dL (ref 6.4–8.2)

## 2013-08-29 LAB — MAGNESIUM: Magnesium: 2.1 mg/dL

## 2013-08-29 LAB — THEOPHYLLINE LEVEL: THEOPHYLLINE: 2.7 ug/mL — AB (ref 10.0–20.0)

## 2013-08-30 LAB — BASIC METABOLIC PANEL
ANION GAP: 7 (ref 7–16)
BUN: 19 mg/dL — AB (ref 7–18)
CALCIUM: 8.7 mg/dL (ref 8.5–10.1)
CHLORIDE: 94 mmol/L — AB (ref 98–107)
CREATININE: 1.03 mg/dL (ref 0.60–1.30)
Co2: 35 mmol/L — ABNORMAL HIGH (ref 21–32)
GFR CALC NON AF AMER: 53 — AB
Glucose: 158 mg/dL — ABNORMAL HIGH (ref 65–99)
Osmolality: 278 (ref 275–301)
Potassium: 2.9 mmol/L — ABNORMAL LOW (ref 3.5–5.1)
SODIUM: 136 mmol/L (ref 136–145)

## 2013-08-30 LAB — CBC WITH DIFFERENTIAL/PLATELET
BASOS ABS: 0 10*3/uL (ref 0.0–0.1)
Basophil %: 0.1 %
Eosinophil #: 0 10*3/uL (ref 0.0–0.7)
Eosinophil %: 0 %
HCT: 34.7 % — ABNORMAL LOW (ref 35.0–47.0)
HGB: 11.3 g/dL — ABNORMAL LOW (ref 12.0–16.0)
LYMPHS PCT: 5.6 %
Lymphocyte #: 0.9 10*3/uL — ABNORMAL LOW (ref 1.0–3.6)
MCH: 25 pg — AB (ref 26.0–34.0)
MCHC: 32.4 g/dL (ref 32.0–36.0)
MCV: 77 fL — ABNORMAL LOW (ref 80–100)
MONO ABS: 0.6 x10 3/mm (ref 0.2–0.9)
Monocyte %: 3.8 %
Neutrophil #: 14.6 10*3/uL — ABNORMAL HIGH (ref 1.4–6.5)
Neutrophil %: 90.5 %
PLATELETS: 326 10*3/uL (ref 150–440)
RBC: 4.5 10*6/uL (ref 3.80–5.20)
RDW: 17.2 % — AB (ref 11.5–14.5)
WBC: 16.1 10*3/uL — ABNORMAL HIGH (ref 3.6–11.0)

## 2013-08-30 LAB — MAGNESIUM: Magnesium: 1.9 mg/dL

## 2013-08-31 LAB — BASIC METABOLIC PANEL
Anion Gap: 6 — ABNORMAL LOW (ref 7–16)
BUN: 26 mg/dL — ABNORMAL HIGH (ref 7–18)
CALCIUM: 8.9 mg/dL (ref 8.5–10.1)
Chloride: 98 mmol/L (ref 98–107)
Co2: 39 mmol/L — ABNORMAL HIGH (ref 21–32)
Creatinine: 1.08 mg/dL (ref 0.60–1.30)
EGFR (African American): 58 — ABNORMAL LOW
GFR CALC NON AF AMER: 50 — AB
Glucose: 151 mg/dL — ABNORMAL HIGH (ref 65–99)
Osmolality: 293 (ref 275–301)
Potassium: 3.7 mmol/L (ref 3.5–5.1)
Sodium: 143 mmol/L (ref 136–145)

## 2013-09-12 ENCOUNTER — Ambulatory Visit: Payer: Self-pay | Admitting: Internal Medicine

## 2013-10-30 ENCOUNTER — Emergency Department: Payer: Self-pay | Admitting: Emergency Medicine

## 2013-11-05 ENCOUNTER — Emergency Department: Payer: Self-pay | Admitting: Emergency Medicine

## 2013-11-13 ENCOUNTER — Ambulatory Visit: Payer: Self-pay | Admitting: Orthopedic Surgery

## 2013-11-29 ENCOUNTER — Inpatient Hospital Stay: Payer: Self-pay | Admitting: Internal Medicine

## 2013-11-29 LAB — BASIC METABOLIC PANEL
Anion Gap: 6 — ABNORMAL LOW (ref 7–16)
BUN: 10 mg/dL (ref 7–18)
CALCIUM: 8.8 mg/dL (ref 8.5–10.1)
Chloride: 102 mmol/L (ref 98–107)
Co2: 37 mmol/L — ABNORMAL HIGH (ref 21–32)
Creatinine: 0.92 mg/dL (ref 0.60–1.30)
EGFR (Non-African Amer.): >60
GLUCOSE: 103 mg/dL — AB (ref 65–99)
OSMOLALITY: 288 (ref 275–301)
Potassium: 2.6 mmol/L — ABNORMAL LOW (ref 3.5–5.1)
Sodium: 145 mmol/L (ref 136–145)

## 2013-11-29 LAB — CBC
HCT: 39.6 % (ref 35.0–47.0)
HGB: 12.7 g/dL (ref 12.0–16.0)
MCH: 26.6 pg (ref 26.0–34.0)
MCHC: 32.1 g/dL (ref 32.0–36.0)
MCV: 83 fL (ref 80–100)
PLATELETS: 312 10*3/uL (ref 150–440)
RBC: 4.78 10*6/uL (ref 3.80–5.20)
RDW: 16.4 % — AB (ref 11.5–14.5)
WBC: 10.7 10*3/uL (ref 3.6–11.0)

## 2013-11-29 LAB — TROPONIN I: Troponin-I: <0.02 ng/mL

## 2013-11-30 LAB — CBC WITH DIFFERENTIAL/PLATELET
BASOS PCT: 0.1 %
Basophil #: 0 10*3/uL (ref 0.0–0.1)
EOS PCT: 0 %
Eosinophil #: 0 10*3/uL (ref 0.0–0.7)
HCT: 34.7 % — ABNORMAL LOW (ref 35.0–47.0)
HGB: 11.3 g/dL — ABNORMAL LOW (ref 12.0–16.0)
LYMPHS ABS: 0.7 10*3/uL — AB (ref 1.0–3.6)
Lymphocyte %: 10.1 %
MCH: 26.9 pg (ref 26.0–34.0)
MCHC: 32.5 g/dL (ref 32.0–36.0)
MCV: 83 fL (ref 80–100)
MONO ABS: 0.1 x10 3/mm — AB (ref 0.2–0.9)
Monocyte %: 1 %
NEUTROS ABS: 6.1 10*3/uL (ref 1.4–6.5)
NEUTROS PCT: 88.8 %
PLATELETS: 257 10*3/uL (ref 150–440)
RBC: 4.2 10*6/uL (ref 3.80–5.20)
RDW: 16.3 % — ABNORMAL HIGH (ref 11.5–14.5)
WBC: 6.9 10*3/uL (ref 3.6–11.0)

## 2013-11-30 LAB — BASIC METABOLIC PANEL
Anion Gap: 9 (ref 7–16)
BUN: 17 mg/dL (ref 7–18)
CALCIUM: 8.4 mg/dL — AB (ref 8.5–10.1)
CO2: 29 mmol/L (ref 21–32)
Chloride: 105 mmol/L (ref 98–107)
Creatinine: 1.08 mg/dL (ref 0.60–1.30)
EGFR (African American): >60
GFR CALC NON AF AMER: 52 — AB
Glucose: 193 mg/dL — ABNORMAL HIGH (ref 65–99)
OSMOLALITY: 292 (ref 275–301)
Potassium: 3.2 mmol/L — ABNORMAL LOW (ref 3.5–5.1)
SODIUM: 143 mmol/L (ref 136–145)

## 2013-11-30 LAB — MAGNESIUM: Magnesium: 1.8 mg/dL

## 2013-11-30 LAB — THEOPHYLLINE LEVEL: Theophylline: <2 ug/mL — ABNORMAL LOW (ref 10.0–20.0)

## 2013-12-03 LAB — CBC WITH DIFFERENTIAL/PLATELET
BASOS PCT: 0.2 %
Basophil #: 0 10*3/uL (ref 0.0–0.1)
Eosinophil #: 0 10*3/uL (ref 0.0–0.7)
Eosinophil %: 0 %
HCT: 36.2 % (ref 35.0–47.0)
HGB: 11.6 g/dL — AB (ref 12.0–16.0)
LYMPHS PCT: 7.5 %
Lymphocyte #: 1 10*3/uL (ref 1.0–3.6)
MCH: 26.4 pg (ref 26.0–34.0)
MCHC: 31.9 g/dL — ABNORMAL LOW (ref 32.0–36.0)
MCV: 83 fL (ref 80–100)
Monocyte #: 0.8 x10 3/mm (ref 0.2–0.9)
Monocyte %: 6 %
Neutrophil #: 11.7 10*3/uL — ABNORMAL HIGH (ref 1.4–6.5)
Neutrophil %: 86.3 %
Platelet: 314 10*3/uL (ref 150–440)
RBC: 4.37 10*6/uL (ref 3.80–5.20)
RDW: 16.8 % — ABNORMAL HIGH (ref 11.5–14.5)
WBC: 13.5 10*3/uL — ABNORMAL HIGH (ref 3.6–11.0)

## 2013-12-03 LAB — BASIC METABOLIC PANEL
ANION GAP: 7 (ref 7–16)
BUN: 28 mg/dL — ABNORMAL HIGH (ref 7–18)
CO2: 33 mmol/L — AB (ref 21–32)
Calcium, Total: 8.3 mg/dL — ABNORMAL LOW (ref 8.5–10.1)
Chloride: 103 mmol/L (ref 98–107)
Creatinine: 0.99 mg/dL (ref 0.60–1.30)
EGFR (Non-African Amer.): 58 — ABNORMAL LOW
Glucose: 161 mg/dL — ABNORMAL HIGH (ref 65–99)
Osmolality: 294 (ref 275–301)
Potassium: 4.2 mmol/L (ref 3.5–5.1)
Sodium: 143 mmol/L (ref 136–145)

## 2013-12-03 LAB — EXPECTORATED SPUTUM ASSESSMENT W REFEX TO RESP CULTURE

## 2013-12-04 ENCOUNTER — Inpatient Hospital Stay: Payer: Self-pay | Admitting: Internal Medicine

## 2013-12-04 LAB — CBC WITH DIFFERENTIAL/PLATELET
BASOS PCT: 0 %
Basophil #: 0 10*3/uL (ref 0.0–0.1)
EOS ABS: 0 10*3/uL (ref 0.0–0.7)
EOS PCT: 0 %
HCT: 37.6 % (ref 35.0–47.0)
HGB: 12 g/dL (ref 12.0–16.0)
LYMPHS ABS: 0.9 10*3/uL — AB (ref 1.0–3.6)
Lymphocyte %: 8 %
MCH: 26.6 pg (ref 26.0–34.0)
MCHC: 31.9 g/dL — ABNORMAL LOW (ref 32.0–36.0)
MCV: 84 fL (ref 80–100)
MONO ABS: 0.8 x10 3/mm (ref 0.2–0.9)
Monocyte %: 6.5 %
Neutrophil #: 9.9 10*3/uL — ABNORMAL HIGH (ref 1.4–6.5)
Neutrophil %: 85.5 %
Platelet: 298 10*3/uL (ref 150–440)
RBC: 4.5 10*6/uL (ref 3.80–5.20)
RDW: 16.6 % — AB (ref 11.5–14.5)
WBC: 11.6 10*3/uL — ABNORMAL HIGH (ref 3.6–11.0)

## 2013-12-04 LAB — COMPREHENSIVE METABOLIC PANEL
ALK PHOS: 112 U/L
ANION GAP: 8 (ref 7–16)
AST: 31 U/L (ref 15–37)
Albumin: 3.5 g/dL (ref 3.4–5.0)
BUN: 27 mg/dL — AB (ref 7–18)
Bilirubin,Total: 0.4 mg/dL (ref 0.2–1.0)
CHLORIDE: 99 mmol/L (ref 98–107)
Calcium, Total: 8 mg/dL — ABNORMAL LOW (ref 8.5–10.1)
Co2: 36 mmol/L — ABNORMAL HIGH (ref 21–32)
Creatinine: 1.05 mg/dL (ref 0.60–1.30)
EGFR (African American): >60
GFR CALC NON AF AMER: 54 — AB
GLUCOSE: 119 mg/dL — AB (ref 65–99)
OSMOLALITY: 291 (ref 275–301)
Potassium: 4 mmol/L (ref 3.5–5.1)
SGPT (ALT): 37 U/L
Sodium: 143 mmol/L (ref 136–145)
TOTAL PROTEIN: 7.2 g/dL (ref 6.4–8.2)

## 2013-12-04 LAB — BASIC METABOLIC PANEL
Anion Gap: 6 — ABNORMAL LOW (ref 7–16)
BUN: 24 mg/dL — ABNORMAL HIGH (ref 7–18)
CO2: 35 mmol/L — AB (ref 21–32)
CREATININE: 0.95 mg/dL (ref 0.60–1.30)
Calcium, Total: 8.2 mg/dL — ABNORMAL LOW (ref 8.5–10.1)
Chloride: 102 mmol/L (ref 98–107)
EGFR (African American): >60
EGFR (Non-African Amer.): >60
GLUCOSE: 170 mg/dL — AB (ref 65–99)
Osmolality: 293 (ref 275–301)
POTASSIUM: 4.6 mmol/L (ref 3.5–5.1)
SODIUM: 143 mmol/L (ref 136–145)

## 2013-12-04 LAB — CBC
HCT: 42.3 % (ref 35.0–47.0)
HGB: 13.6 g/dL (ref 12.0–16.0)
MCH: 26.4 pg (ref 26.0–34.0)
MCHC: 32.1 g/dL (ref 32.0–36.0)
MCV: 83 fL (ref 80–100)
Platelet: 405 10*3/uL (ref 150–440)
RBC: 5.13 10*6/uL (ref 3.80–5.20)
RDW: 16.5 % — AB (ref 11.5–14.5)
WBC: 20.4 10*3/uL — ABNORMAL HIGH (ref 3.6–11.0)

## 2013-12-04 LAB — PRO B NATRIURETIC PEPTIDE: B-Type Natriuretic Peptide: 620 pg/mL — ABNORMAL HIGH (ref 0–450)

## 2013-12-04 LAB — TROPONIN I: Troponin-I: <0.02 ng/mL

## 2013-12-04 LAB — MAGNESIUM: Magnesium: 2.3 mg/dL

## 2013-12-05 LAB — CBC WITH DIFFERENTIAL/PLATELET
Basophil #: 0 10*3/uL (ref 0.0–0.1)
Basophil %: 0 %
Eosinophil #: 0 10*3/uL (ref 0.0–0.7)
Eosinophil %: 0.1 %
HCT: 37.2 % (ref 35.0–47.0)
HGB: 11.9 g/dL — ABNORMAL LOW (ref 12.0–16.0)
LYMPHS ABS: 1.2 10*3/uL (ref 1.0–3.6)
LYMPHS PCT: 8 %
MCH: 26.5 pg (ref 26.0–34.0)
MCHC: 32 g/dL (ref 32.0–36.0)
MCV: 83 fL (ref 80–100)
Monocyte #: 1.1 x10 3/mm — ABNORMAL HIGH (ref 0.2–0.9)
Monocyte %: 7.5 %
NEUTROS PCT: 84.4 %
Neutrophil #: 12.6 10*3/uL — ABNORMAL HIGH (ref 1.4–6.5)
PLATELETS: 294 10*3/uL (ref 150–440)
RBC: 4.49 10*6/uL (ref 3.80–5.20)
RDW: 16.8 % — AB (ref 11.5–14.5)
WBC: 15 10*3/uL — AB (ref 3.6–11.0)

## 2013-12-05 LAB — BASIC METABOLIC PANEL
Anion Gap: 7 (ref 7–16)
BUN: 29 mg/dL — ABNORMAL HIGH (ref 7–18)
CO2: 35 mmol/L — AB (ref 21–32)
CREATININE: 1.02 mg/dL (ref 0.60–1.30)
Calcium, Total: 8 mg/dL — ABNORMAL LOW (ref 8.5–10.1)
Chloride: 100 mmol/L (ref 98–107)
EGFR (African American): >60
EGFR (Non-African Amer.): 56 — ABNORMAL LOW
Glucose: 172 mg/dL — ABNORMAL HIGH (ref 65–99)
OSMOLALITY: 293 (ref 275–301)
POTASSIUM: 4.6 mmol/L (ref 3.5–5.1)
SODIUM: 142 mmol/L (ref 136–145)

## 2013-12-05 LAB — MAGNESIUM: MAGNESIUM: 3.3 mg/dL — AB

## 2013-12-06 LAB — BASIC METABOLIC PANEL
Anion Gap: 6 — ABNORMAL LOW (ref 7–16)
BUN: 29 mg/dL — AB (ref 7–18)
CALCIUM: 7.9 mg/dL — AB (ref 8.5–10.1)
Chloride: 104 mmol/L (ref 98–107)
Co2: 33 mmol/L — ABNORMAL HIGH (ref 21–32)
Creatinine: 0.91 mg/dL (ref 0.60–1.30)
EGFR (Non-African Amer.): >60
GLUCOSE: 147 mg/dL — AB (ref 65–99)
OSMOLALITY: 294 (ref 275–301)
Potassium: 4.5 mmol/L (ref 3.5–5.1)
SODIUM: 143 mmol/L (ref 136–145)

## 2013-12-06 LAB — CBC WITH DIFFERENTIAL/PLATELET
HCT: 39.1 % (ref 35.0–47.0)
HGB: 12.7 g/dL (ref 12.0–16.0)
Lymphocytes: 6 %
MCH: 26.6 pg (ref 26.0–34.0)
MCHC: 32.4 g/dL (ref 32.0–36.0)
MCV: 82 fL (ref 80–100)
METAMYELOCYTE: 2 %
MONOS PCT: 2 %
Platelet: 337 10*3/uL (ref 150–440)
RBC: 4.75 10*6/uL (ref 3.80–5.20)
RDW: 16.8 % — ABNORMAL HIGH (ref 11.5–14.5)
Segmented Neutrophils: 90 %
WBC: 13.8 10*3/uL — ABNORMAL HIGH (ref 3.6–11.0)

## 2013-12-06 LAB — THEOPHYLLINE LEVEL: Theophylline: <2 ug/mL — ABNORMAL LOW (ref 10.0–20.0)

## 2013-12-12 ENCOUNTER — Ambulatory Visit: Payer: Self-pay | Admitting: Internal Medicine

## 2013-12-21 ENCOUNTER — Ambulatory Visit: Payer: Self-pay | Admitting: Internal Medicine

## 2013-12-22 ENCOUNTER — Inpatient Hospital Stay: Payer: Self-pay | Admitting: Internal Medicine

## 2013-12-22 LAB — CBC WITH DIFFERENTIAL/PLATELET
Basophil #: 0 10*3/uL (ref 0.0–0.1)
Basophil %: 0.1 %
EOS ABS: 0 10*3/uL (ref 0.0–0.7)
EOS PCT: 0.1 %
HCT: 36.8 % (ref 35.0–47.0)
HGB: 11.8 g/dL — ABNORMAL LOW (ref 12.0–16.0)
LYMPHS ABS: 0.5 10*3/uL — AB (ref 1.0–3.6)
LYMPHS PCT: 7.1 %
MCH: 26.9 pg (ref 26.0–34.0)
MCHC: 32 g/dL (ref 32.0–36.0)
MCV: 84 fL (ref 80–100)
MONO ABS: 0.2 x10 3/mm (ref 0.2–0.9)
Monocyte %: 2.9 %
NEUTROS PCT: 89.8 %
Neutrophil #: 6.6 10*3/uL — ABNORMAL HIGH (ref 1.4–6.5)
Platelet: 267 10*3/uL (ref 150–440)
RBC: 4.37 10*6/uL (ref 3.80–5.20)
RDW: 16.5 % — ABNORMAL HIGH (ref 11.5–14.5)
WBC: 7.3 10*3/uL (ref 3.6–11.0)

## 2013-12-22 LAB — TROPONIN I

## 2013-12-22 LAB — BASIC METABOLIC PANEL
Anion Gap: 7 (ref 7–16)
BUN: 17 mg/dL (ref 7–18)
CALCIUM: 8 mg/dL — AB (ref 8.5–10.1)
CHLORIDE: 103 mmol/L (ref 98–107)
CREATININE: 1.03 mg/dL (ref 0.60–1.30)
Co2: 29 mmol/L (ref 21–32)
EGFR (African American): >60
EGFR (Non-African Amer.): 55 — ABNORMAL LOW
GLUCOSE: 131 mg/dL — AB (ref 65–99)
OSMOLALITY: 281 (ref 275–301)
POTASSIUM: 3.5 mmol/L (ref 3.5–5.1)
Sodium: 139 mmol/L (ref 136–145)

## 2013-12-23 LAB — CBC WITH DIFFERENTIAL/PLATELET
BASOS ABS: 0 10*3/uL (ref 0.0–0.1)
BASOS PCT: 0.4 %
Eosinophil #: 0 10*3/uL (ref 0.0–0.7)
Eosinophil %: 0.2 %
HCT: 35.2 % (ref 35.0–47.0)
HGB: 11.3 g/dL — ABNORMAL LOW (ref 12.0–16.0)
LYMPHS ABS: 1.8 10*3/uL (ref 1.0–3.6)
Lymphocyte %: 17.6 %
MCH: 27.2 pg (ref 26.0–34.0)
MCHC: 32.3 g/dL (ref 32.0–36.0)
MCV: 84 fL (ref 80–100)
Monocyte #: 0.6 x10 3/mm (ref 0.2–0.9)
Monocyte %: 5.7 %
Neutrophil #: 7.9 10*3/uL — ABNORMAL HIGH (ref 1.4–6.5)
Neutrophil %: 76.1 %
Platelet: 270 10*3/uL (ref 150–440)
RBC: 4.17 10*6/uL (ref 3.80–5.20)
RDW: 16.3 % — AB (ref 11.5–14.5)
WBC: 10.4 10*3/uL (ref 3.6–11.0)

## 2013-12-23 LAB — BASIC METABOLIC PANEL
Anion Gap: 8 (ref 7–16)
BUN: 16 mg/dL (ref 7–18)
CREATININE: 0.91 mg/dL (ref 0.60–1.30)
Calcium, Total: 8.4 mg/dL — ABNORMAL LOW (ref 8.5–10.1)
Chloride: 102 mmol/L (ref 98–107)
Co2: 31 mmol/L (ref 21–32)
EGFR (African American): >60
Glucose: 87 mg/dL (ref 65–99)
OSMOLALITY: 282 (ref 275–301)
Potassium: 3.5 mmol/L (ref 3.5–5.1)
Sodium: 141 mmol/L (ref 136–145)

## 2013-12-25 LAB — CBC WITH DIFFERENTIAL/PLATELET
BASOS ABS: 0 10*3/uL (ref 0.0–0.1)
Basophil %: 0 %
EOS PCT: 0 %
Eosinophil #: 0 10*3/uL (ref 0.0–0.7)
HCT: 34.6 % — AB (ref 35.0–47.0)
HGB: 11.2 g/dL — ABNORMAL LOW (ref 12.0–16.0)
Lymphocyte #: 0.8 10*3/uL — ABNORMAL LOW (ref 1.0–3.6)
Lymphocyte %: 7.7 %
MCH: 27.3 pg (ref 26.0–34.0)
MCHC: 32.4 g/dL (ref 32.0–36.0)
MCV: 84 fL (ref 80–100)
MONO ABS: 0.4 x10 3/mm (ref 0.2–0.9)
MONOS PCT: 3.8 %
Neutrophil #: 8.6 10*3/uL — ABNORMAL HIGH (ref 1.4–6.5)
Neutrophil %: 88.5 %
Platelet: 319 10*3/uL (ref 150–440)
RBC: 4.1 10*6/uL (ref 3.80–5.20)
RDW: 16.6 % — ABNORMAL HIGH (ref 11.5–14.5)
WBC: 9.7 10*3/uL (ref 3.6–11.0)

## 2013-12-25 LAB — BASIC METABOLIC PANEL
ANION GAP: 8 (ref 7–16)
BUN: 29 mg/dL — ABNORMAL HIGH (ref 7–18)
Calcium, Total: 9.1 mg/dL (ref 8.5–10.1)
Chloride: 99 mmol/L (ref 98–107)
Co2: 34 mmol/L — ABNORMAL HIGH (ref 21–32)
Creatinine: 1.14 mg/dL (ref 0.60–1.30)
EGFR (Non-African Amer.): 49 — ABNORMAL LOW
GFR CALC AF AMER: 60 — AB
Glucose: 129 mg/dL — ABNORMAL HIGH (ref 65–99)
OSMOLALITY: 289 (ref 275–301)
Potassium: 3.2 mmol/L — ABNORMAL LOW (ref 3.5–5.1)
Sodium: 141 mmol/L (ref 136–145)

## 2013-12-26 LAB — BASIC METABOLIC PANEL
Anion Gap: 7 (ref 7–16)
BUN: 30 mg/dL — AB (ref 7–18)
CALCIUM: 8.8 mg/dL (ref 8.5–10.1)
CO2: 32 mmol/L (ref 21–32)
Chloride: 102 mmol/L (ref 98–107)
Creatinine: 0.99 mg/dL (ref 0.60–1.30)
GFR CALC NON AF AMER: 58 — AB
GLUCOSE: 87 mg/dL (ref 65–99)
Osmolality: 287 (ref 275–301)
POTASSIUM: 3.1 mmol/L — AB (ref 3.5–5.1)
SODIUM: 141 mmol/L (ref 136–145)

## 2013-12-26 LAB — POTASSIUM: Potassium: 3.1 mmol/L — ABNORMAL LOW (ref 3.5–5.1)

## 2013-12-26 LAB — MAGNESIUM: Magnesium: 2.4 mg/dL

## 2013-12-27 ENCOUNTER — Inpatient Hospital Stay: Payer: Self-pay | Admitting: Internal Medicine

## 2013-12-27 LAB — CBC WITH DIFFERENTIAL/PLATELET
BASOS ABS: 0 10*3/uL (ref 0.0–0.1)
BASOS PCT: 0.3 %
EOS PCT: 0.2 %
Eosinophil #: 0 10*3/uL (ref 0.0–0.7)
HCT: 40 % (ref 35.0–47.0)
HGB: 12.9 g/dL (ref 12.0–16.0)
Lymphocyte #: 1 10*3/uL (ref 1.0–3.6)
Lymphocyte %: 7.4 %
MCH: 27.2 pg (ref 26.0–34.0)
MCHC: 32.2 g/dL (ref 32.0–36.0)
MCV: 85 fL (ref 80–100)
MONOS PCT: 4 %
Monocyte #: 0.5 x10 3/mm (ref 0.2–0.9)
NEUTROS PCT: 88.1 %
Neutrophil #: 12 10*3/uL — ABNORMAL HIGH (ref 1.4–6.5)
Platelet: 409 10*3/uL (ref 150–440)
RBC: 4.74 10*6/uL (ref 3.80–5.20)
RDW: 16.9 % — ABNORMAL HIGH (ref 11.5–14.5)
WBC: 13.6 10*3/uL — ABNORMAL HIGH (ref 3.6–11.0)

## 2013-12-27 LAB — MAGNESIUM: MAGNESIUM: 2.2 mg/dL

## 2013-12-27 LAB — PROTIME-INR
INR: 0.8
PROTHROMBIN TIME: 11.1 s — AB (ref 11.5–14.7)

## 2013-12-27 LAB — CULTURE, BLOOD (SINGLE)

## 2013-12-27 LAB — COMPREHENSIVE METABOLIC PANEL
ALBUMIN: 3.4 g/dL (ref 3.4–5.0)
ALK PHOS: 135 U/L — AB
Anion Gap: 9 (ref 7–16)
BILIRUBIN TOTAL: 0.4 mg/dL (ref 0.2–1.0)
BUN: 29 mg/dL — AB (ref 7–18)
CO2: 32 mmol/L (ref 21–32)
Calcium, Total: 8.7 mg/dL (ref 8.5–10.1)
Chloride: 101 mmol/L (ref 98–107)
Creatinine: 1.05 mg/dL (ref 0.60–1.30)
EGFR (Non-African Amer.): 54 — ABNORMAL LOW
GLUCOSE: 95 mg/dL (ref 65–99)
OSMOLALITY: 289 (ref 275–301)
Potassium: 3.4 mmol/L — ABNORMAL LOW (ref 3.5–5.1)
SGOT(AST): 27 U/L (ref 15–37)
SGPT (ALT): 28 U/L
SODIUM: 142 mmol/L (ref 136–145)
Total Protein: 7.2 g/dL (ref 6.4–8.2)

## 2013-12-27 LAB — TROPONIN I: Troponin-I: <0.02 ng/mL

## 2013-12-27 LAB — PHOSPHORUS: PHOSPHORUS: 2.9 mg/dL (ref 2.5–4.9)

## 2013-12-28 LAB — BASIC METABOLIC PANEL
Anion Gap: 8 (ref 7–16)
BUN: 27 mg/dL — ABNORMAL HIGH (ref 7–18)
CREATININE: 0.95 mg/dL (ref 0.60–1.30)
Calcium, Total: 8.8 mg/dL (ref 8.5–10.1)
Chloride: 97 mmol/L — ABNORMAL LOW (ref 98–107)
Co2: 34 mmol/L — ABNORMAL HIGH (ref 21–32)
EGFR (Non-African Amer.): >60
Glucose: 159 mg/dL — ABNORMAL HIGH (ref 65–99)
Osmolality: 286 (ref 275–301)
Potassium: 3.5 mmol/L (ref 3.5–5.1)
SODIUM: 139 mmol/L (ref 136–145)

## 2013-12-28 LAB — CBC WITH DIFFERENTIAL/PLATELET
Basophil #: 0 10*3/uL (ref 0.0–0.1)
Basophil %: 0 %
EOS PCT: 0 %
Eosinophil #: 0 10*3/uL (ref 0.0–0.7)
HCT: 34.1 % — ABNORMAL LOW (ref 35.0–47.0)
HGB: 11 g/dL — AB (ref 12.0–16.0)
LYMPHS ABS: 0.9 10*3/uL — AB (ref 1.0–3.6)
LYMPHS PCT: 11.5 %
MCH: 27.2 pg (ref 26.0–34.0)
MCHC: 32.1 g/dL (ref 32.0–36.0)
MCV: 85 fL (ref 80–100)
MONO ABS: 0.4 x10 3/mm (ref 0.2–0.9)
Monocyte %: 4.8 %
Neutrophil #: 6.3 10*3/uL (ref 1.4–6.5)
Neutrophil %: 83.7 %
Platelet: 348 10*3/uL (ref 150–440)
RBC: 4.04 10*6/uL (ref 3.80–5.20)
RDW: 16.6 % — AB (ref 11.5–14.5)
WBC: 7.5 10*3/uL (ref 3.6–11.0)

## 2013-12-29 LAB — HEMOGLOBIN: HGB: 11.7 g/dL — ABNORMAL LOW (ref 12.0–16.0)

## 2013-12-29 LAB — THEOPHYLLINE LEVEL: Theophylline: <2 ug/mL — ABNORMAL LOW (ref 10.0–20.0)

## 2014-01-01 LAB — CULTURE, BLOOD (SINGLE)

## 2014-01-08 ENCOUNTER — Ambulatory Visit: Payer: Self-pay | Admitting: Family Medicine

## 2014-01-12 ENCOUNTER — Ambulatory Visit: Payer: Self-pay | Admitting: Internal Medicine

## 2014-01-15 DIAGNOSIS — J441 Chronic obstructive pulmonary disease with (acute) exacerbation: Secondary | ICD-10-CM | POA: Diagnosis not present

## 2014-01-15 DIAGNOSIS — G4733 Obstructive sleep apnea (adult) (pediatric): Secondary | ICD-10-CM | POA: Diagnosis not present

## 2014-01-15 DIAGNOSIS — Z9981 Dependence on supplemental oxygen: Secondary | ICD-10-CM | POA: Diagnosis not present

## 2014-01-15 DIAGNOSIS — Z720 Tobacco use: Secondary | ICD-10-CM | POA: Diagnosis not present

## 2014-01-15 DIAGNOSIS — J159 Unspecified bacterial pneumonia: Secondary | ICD-10-CM | POA: Diagnosis not present

## 2014-01-16 DIAGNOSIS — G4733 Obstructive sleep apnea (adult) (pediatric): Secondary | ICD-10-CM | POA: Diagnosis not present

## 2014-01-16 DIAGNOSIS — Z9981 Dependence on supplemental oxygen: Secondary | ICD-10-CM | POA: Diagnosis not present

## 2014-01-16 DIAGNOSIS — J441 Chronic obstructive pulmonary disease with (acute) exacerbation: Secondary | ICD-10-CM | POA: Diagnosis not present

## 2014-01-16 DIAGNOSIS — J159 Unspecified bacterial pneumonia: Secondary | ICD-10-CM | POA: Diagnosis not present

## 2014-01-16 DIAGNOSIS — Z720 Tobacco use: Secondary | ICD-10-CM | POA: Diagnosis not present

## 2014-01-17 DIAGNOSIS — M4800 Spinal stenosis, site unspecified: Secondary | ICD-10-CM | POA: Diagnosis not present

## 2014-01-17 DIAGNOSIS — Z6822 Body mass index (BMI) 22.0-22.9, adult: Secondary | ICD-10-CM | POA: Diagnosis not present

## 2014-01-17 DIAGNOSIS — S32020A Wedge compression fracture of second lumbar vertebra, initial encounter for closed fracture: Secondary | ICD-10-CM | POA: Diagnosis not present

## 2014-01-18 DIAGNOSIS — G4733 Obstructive sleep apnea (adult) (pediatric): Secondary | ICD-10-CM | POA: Diagnosis not present

## 2014-01-18 DIAGNOSIS — Z9981 Dependence on supplemental oxygen: Secondary | ICD-10-CM | POA: Diagnosis not present

## 2014-01-18 DIAGNOSIS — Z720 Tobacco use: Secondary | ICD-10-CM | POA: Diagnosis not present

## 2014-01-18 DIAGNOSIS — J159 Unspecified bacterial pneumonia: Secondary | ICD-10-CM | POA: Diagnosis not present

## 2014-01-18 DIAGNOSIS — J441 Chronic obstructive pulmonary disease with (acute) exacerbation: Secondary | ICD-10-CM | POA: Diagnosis not present

## 2014-01-20 ENCOUNTER — Inpatient Hospital Stay: Payer: Self-pay | Admitting: Internal Medicine

## 2014-01-20 DIAGNOSIS — I2510 Atherosclerotic heart disease of native coronary artery without angina pectoris: Secondary | ICD-10-CM | POA: Diagnosis not present

## 2014-01-20 DIAGNOSIS — J189 Pneumonia, unspecified organism: Secondary | ICD-10-CM | POA: Diagnosis not present

## 2014-01-20 DIAGNOSIS — R0602 Shortness of breath: Secondary | ICD-10-CM | POA: Diagnosis not present

## 2014-01-20 DIAGNOSIS — R062 Wheezing: Secondary | ICD-10-CM | POA: Diagnosis not present

## 2014-01-20 DIAGNOSIS — E876 Hypokalemia: Secondary | ICD-10-CM | POA: Diagnosis not present

## 2014-01-20 DIAGNOSIS — J181 Lobar pneumonia, unspecified organism: Secondary | ICD-10-CM | POA: Diagnosis not present

## 2014-01-20 DIAGNOSIS — E785 Hyperlipidemia, unspecified: Secondary | ICD-10-CM | POA: Diagnosis not present

## 2014-01-20 DIAGNOSIS — R0689 Other abnormalities of breathing: Secondary | ICD-10-CM | POA: Diagnosis not present

## 2014-01-20 DIAGNOSIS — Z9981 Dependence on supplemental oxygen: Secondary | ICD-10-CM | POA: Diagnosis not present

## 2014-01-20 DIAGNOSIS — R918 Other nonspecific abnormal finding of lung field: Secondary | ICD-10-CM | POA: Diagnosis not present

## 2014-01-20 DIAGNOSIS — F419 Anxiety disorder, unspecified: Secondary | ICD-10-CM | POA: Diagnosis not present

## 2014-01-20 DIAGNOSIS — I10 Essential (primary) hypertension: Secondary | ICD-10-CM | POA: Diagnosis not present

## 2014-01-20 DIAGNOSIS — Z7902 Long term (current) use of antithrombotics/antiplatelets: Secondary | ICD-10-CM | POA: Diagnosis not present

## 2014-01-20 DIAGNOSIS — K219 Gastro-esophageal reflux disease without esophagitis: Secondary | ICD-10-CM | POA: Diagnosis not present

## 2014-01-20 DIAGNOSIS — Z888 Allergy status to other drugs, medicaments and biological substances status: Secondary | ICD-10-CM | POA: Diagnosis not present

## 2014-01-20 DIAGNOSIS — E039 Hypothyroidism, unspecified: Secondary | ICD-10-CM | POA: Diagnosis not present

## 2014-01-20 DIAGNOSIS — J449 Chronic obstructive pulmonary disease, unspecified: Secondary | ICD-10-CM | POA: Diagnosis not present

## 2014-01-20 DIAGNOSIS — F329 Major depressive disorder, single episode, unspecified: Secondary | ICD-10-CM | POA: Diagnosis not present

## 2014-01-20 DIAGNOSIS — I517 Cardiomegaly: Secondary | ICD-10-CM | POA: Diagnosis not present

## 2014-01-20 DIAGNOSIS — Z955 Presence of coronary angioplasty implant and graft: Secondary | ICD-10-CM | POA: Diagnosis not present

## 2014-01-20 DIAGNOSIS — J9620 Acute and chronic respiratory failure, unspecified whether with hypoxia or hypercapnia: Secondary | ICD-10-CM | POA: Diagnosis not present

## 2014-01-20 DIAGNOSIS — Z87891 Personal history of nicotine dependence: Secondary | ICD-10-CM | POA: Diagnosis not present

## 2014-01-20 DIAGNOSIS — J441 Chronic obstructive pulmonary disease with (acute) exacerbation: Secondary | ICD-10-CM | POA: Diagnosis not present

## 2014-01-20 DIAGNOSIS — Z79899 Other long term (current) drug therapy: Secondary | ICD-10-CM | POA: Diagnosis not present

## 2014-01-20 DIAGNOSIS — Z7982 Long term (current) use of aspirin: Secondary | ICD-10-CM | POA: Diagnosis not present

## 2014-01-20 DIAGNOSIS — F411 Generalized anxiety disorder: Secondary | ICD-10-CM | POA: Diagnosis not present

## 2014-01-20 LAB — COMPREHENSIVE METABOLIC PANEL
Albumin: 3.2 g/dL — ABNORMAL LOW (ref 3.4–5.0)
Alkaline Phosphatase: 91 U/L
Anion Gap: 6 — ABNORMAL LOW (ref 7–16)
BILIRUBIN TOTAL: 0.3 mg/dL (ref 0.2–1.0)
BUN: 14 mg/dL (ref 7–18)
CALCIUM: 8.6 mg/dL (ref 8.5–10.1)
Chloride: 101 mmol/L (ref 98–107)
Co2: 36 mmol/L — ABNORMAL HIGH (ref 21–32)
Creatinine: 0.8 mg/dL (ref 0.60–1.30)
EGFR (Non-African Amer.): >60
Glucose: 112 mg/dL — ABNORMAL HIGH (ref 65–99)
OSMOLALITY: 286 (ref 275–301)
POTASSIUM: 2.7 mmol/L — AB (ref 3.5–5.1)
SGOT(AST): 29 U/L (ref 15–37)
SGPT (ALT): 20 U/L
Sodium: 143 mmol/L (ref 136–145)
TOTAL PROTEIN: 6.7 g/dL (ref 6.4–8.2)

## 2014-01-20 LAB — CBC
HCT: 34.6 % — ABNORMAL LOW (ref 35.0–47.0)
HGB: 11.2 g/dL — ABNORMAL LOW (ref 12.0–16.0)
MCH: 27.5 pg (ref 26.0–34.0)
MCHC: 32.3 g/dL (ref 32.0–36.0)
MCV: 85 fL (ref 80–100)
Platelet: 293 10*3/uL (ref 150–440)
RBC: 4.06 10*6/uL (ref 3.80–5.20)
RDW: 17.2 % — ABNORMAL HIGH (ref 11.5–14.5)
WBC: 10.1 10*3/uL (ref 3.6–11.0)

## 2014-01-20 LAB — TROPONIN I: Troponin-I: <0.02 ng/mL

## 2014-01-21 LAB — CBC WITH DIFFERENTIAL/PLATELET
BASOS PCT: 0.1 %
Basophil #: 0 10*3/uL (ref 0.0–0.1)
EOS PCT: 0 %
Eosinophil #: 0 10*3/uL (ref 0.0–0.7)
HCT: 31.1 % — AB (ref 35.0–47.0)
HGB: 10.4 g/dL — AB (ref 12.0–16.0)
LYMPHS ABS: 0.6 10*3/uL — AB (ref 1.0–3.6)
Lymphocyte %: 6.9 %
MCH: 27.9 pg (ref 26.0–34.0)
MCHC: 33.3 g/dL (ref 32.0–36.0)
MCV: 84 fL (ref 80–100)
MONO ABS: 0.2 x10 3/mm (ref 0.2–0.9)
Monocyte %: 2.6 %
Neutrophil #: 7.9 10*3/uL — ABNORMAL HIGH (ref 1.4–6.5)
Neutrophil %: 90.4 %
Platelet: 295 10*3/uL (ref 150–440)
RBC: 3.72 10*6/uL — ABNORMAL LOW (ref 3.80–5.20)
RDW: 17 % — ABNORMAL HIGH (ref 11.5–14.5)
WBC: 8.7 10*3/uL (ref 3.6–11.0)

## 2014-01-21 LAB — BASIC METABOLIC PANEL
Anion Gap: 9 (ref 7–16)
BUN: 17 mg/dL (ref 7–18)
CALCIUM: 8.7 mg/dL (ref 8.5–10.1)
Chloride: 100 mmol/L (ref 98–107)
Co2: 33 mmol/L — ABNORMAL HIGH (ref 21–32)
Creatinine: 0.94 mg/dL (ref 0.60–1.30)
EGFR (African American): >60
GLUCOSE: 135 mg/dL — AB (ref 65–99)
OSMOLALITY: 287 (ref 275–301)
Potassium: 2.8 mmol/L — ABNORMAL LOW (ref 3.5–5.1)
Sodium: 142 mmol/L (ref 136–145)

## 2014-01-22 LAB — POTASSIUM: Potassium: 3 mmol/L — ABNORMAL LOW (ref 3.5–5.1)

## 2014-01-24 DIAGNOSIS — J159 Unspecified bacterial pneumonia: Secondary | ICD-10-CM | POA: Diagnosis not present

## 2014-01-24 DIAGNOSIS — Z720 Tobacco use: Secondary | ICD-10-CM | POA: Diagnosis not present

## 2014-01-24 DIAGNOSIS — G4733 Obstructive sleep apnea (adult) (pediatric): Secondary | ICD-10-CM | POA: Diagnosis not present

## 2014-01-24 DIAGNOSIS — J441 Chronic obstructive pulmonary disease with (acute) exacerbation: Secondary | ICD-10-CM | POA: Diagnosis not present

## 2014-01-24 DIAGNOSIS — Z9981 Dependence on supplemental oxygen: Secondary | ICD-10-CM | POA: Diagnosis not present

## 2014-01-25 DIAGNOSIS — Z9981 Dependence on supplemental oxygen: Secondary | ICD-10-CM | POA: Diagnosis not present

## 2014-01-25 DIAGNOSIS — J159 Unspecified bacterial pneumonia: Secondary | ICD-10-CM | POA: Diagnosis not present

## 2014-01-25 DIAGNOSIS — Z720 Tobacco use: Secondary | ICD-10-CM | POA: Diagnosis not present

## 2014-01-25 DIAGNOSIS — J441 Chronic obstructive pulmonary disease with (acute) exacerbation: Secondary | ICD-10-CM | POA: Diagnosis not present

## 2014-01-25 DIAGNOSIS — G4733 Obstructive sleep apnea (adult) (pediatric): Secondary | ICD-10-CM | POA: Diagnosis not present

## 2014-01-25 LAB — CULTURE, BLOOD (SINGLE)

## 2014-01-27 DIAGNOSIS — J159 Unspecified bacterial pneumonia: Secondary | ICD-10-CM | POA: Diagnosis not present

## 2014-01-27 DIAGNOSIS — G4733 Obstructive sleep apnea (adult) (pediatric): Secondary | ICD-10-CM | POA: Diagnosis not present

## 2014-01-27 DIAGNOSIS — Z720 Tobacco use: Secondary | ICD-10-CM | POA: Diagnosis not present

## 2014-01-27 DIAGNOSIS — Z9981 Dependence on supplemental oxygen: Secondary | ICD-10-CM | POA: Diagnosis not present

## 2014-01-27 DIAGNOSIS — J441 Chronic obstructive pulmonary disease with (acute) exacerbation: Secondary | ICD-10-CM | POA: Diagnosis not present

## 2014-01-29 DIAGNOSIS — F17211 Nicotine dependence, cigarettes, in remission: Secondary | ICD-10-CM | POA: Diagnosis not present

## 2014-01-29 DIAGNOSIS — J449 Chronic obstructive pulmonary disease, unspecified: Secondary | ICD-10-CM | POA: Diagnosis not present

## 2014-01-29 DIAGNOSIS — J209 Acute bronchitis, unspecified: Secondary | ICD-10-CM | POA: Diagnosis not present

## 2014-01-29 DIAGNOSIS — J9690 Respiratory failure, unspecified, unspecified whether with hypoxia or hypercapnia: Secondary | ICD-10-CM | POA: Diagnosis not present

## 2014-01-29 DIAGNOSIS — Z23 Encounter for immunization: Secondary | ICD-10-CM | POA: Diagnosis not present

## 2014-01-29 DIAGNOSIS — R0902 Hypoxemia: Secondary | ICD-10-CM | POA: Diagnosis not present

## 2014-01-29 DIAGNOSIS — R0602 Shortness of breath: Secondary | ICD-10-CM | POA: Diagnosis not present

## 2014-01-30 DIAGNOSIS — Z720 Tobacco use: Secondary | ICD-10-CM | POA: Diagnosis not present

## 2014-01-30 DIAGNOSIS — Z9981 Dependence on supplemental oxygen: Secondary | ICD-10-CM | POA: Diagnosis not present

## 2014-01-30 DIAGNOSIS — J159 Unspecified bacterial pneumonia: Secondary | ICD-10-CM | POA: Diagnosis not present

## 2014-01-30 DIAGNOSIS — J441 Chronic obstructive pulmonary disease with (acute) exacerbation: Secondary | ICD-10-CM | POA: Diagnosis not present

## 2014-01-30 DIAGNOSIS — G4733 Obstructive sleep apnea (adult) (pediatric): Secondary | ICD-10-CM | POA: Diagnosis not present

## 2014-01-31 DIAGNOSIS — G4733 Obstructive sleep apnea (adult) (pediatric): Secondary | ICD-10-CM | POA: Diagnosis not present

## 2014-01-31 DIAGNOSIS — J159 Unspecified bacterial pneumonia: Secondary | ICD-10-CM | POA: Diagnosis not present

## 2014-01-31 DIAGNOSIS — J441 Chronic obstructive pulmonary disease with (acute) exacerbation: Secondary | ICD-10-CM | POA: Diagnosis not present

## 2014-01-31 DIAGNOSIS — Z9981 Dependence on supplemental oxygen: Secondary | ICD-10-CM | POA: Diagnosis not present

## 2014-01-31 DIAGNOSIS — Z720 Tobacco use: Secondary | ICD-10-CM | POA: Diagnosis not present

## 2014-02-01 DIAGNOSIS — Z9981 Dependence on supplemental oxygen: Secondary | ICD-10-CM | POA: Diagnosis not present

## 2014-02-01 DIAGNOSIS — J159 Unspecified bacterial pneumonia: Secondary | ICD-10-CM | POA: Diagnosis not present

## 2014-02-01 DIAGNOSIS — Z720 Tobacco use: Secondary | ICD-10-CM | POA: Diagnosis not present

## 2014-02-01 DIAGNOSIS — J441 Chronic obstructive pulmonary disease with (acute) exacerbation: Secondary | ICD-10-CM | POA: Diagnosis not present

## 2014-02-01 DIAGNOSIS — G4733 Obstructive sleep apnea (adult) (pediatric): Secondary | ICD-10-CM | POA: Diagnosis not present

## 2014-02-03 ENCOUNTER — Inpatient Hospital Stay: Payer: Self-pay | Admitting: Internal Medicine

## 2014-02-03 DIAGNOSIS — Z8249 Family history of ischemic heart disease and other diseases of the circulatory system: Secondary | ICD-10-CM | POA: Diagnosis not present

## 2014-02-03 DIAGNOSIS — E039 Hypothyroidism, unspecified: Secondary | ICD-10-CM | POA: Diagnosis not present

## 2014-02-03 DIAGNOSIS — I2510 Atherosclerotic heart disease of native coronary artery without angina pectoris: Secondary | ICD-10-CM | POA: Diagnosis not present

## 2014-02-03 DIAGNOSIS — D649 Anemia, unspecified: Secondary | ICD-10-CM | POA: Diagnosis not present

## 2014-02-03 DIAGNOSIS — Z7982 Long term (current) use of aspirin: Secondary | ICD-10-CM | POA: Diagnosis not present

## 2014-02-03 DIAGNOSIS — F418 Other specified anxiety disorders: Secondary | ICD-10-CM | POA: Diagnosis not present

## 2014-02-03 DIAGNOSIS — J209 Acute bronchitis, unspecified: Secondary | ICD-10-CM | POA: Diagnosis not present

## 2014-02-03 DIAGNOSIS — J440 Chronic obstructive pulmonary disease with acute lower respiratory infection: Secondary | ICD-10-CM | POA: Diagnosis not present

## 2014-02-03 DIAGNOSIS — J449 Chronic obstructive pulmonary disease, unspecified: Secondary | ICD-10-CM | POA: Diagnosis not present

## 2014-02-03 DIAGNOSIS — R0902 Hypoxemia: Secondary | ICD-10-CM | POA: Diagnosis not present

## 2014-02-03 DIAGNOSIS — R0602 Shortness of breath: Secondary | ICD-10-CM | POA: Diagnosis not present

## 2014-02-03 DIAGNOSIS — Z720 Tobacco use: Secondary | ICD-10-CM | POA: Diagnosis not present

## 2014-02-03 DIAGNOSIS — Z66 Do not resuscitate: Secondary | ICD-10-CM | POA: Diagnosis not present

## 2014-02-03 DIAGNOSIS — J189 Pneumonia, unspecified organism: Secondary | ICD-10-CM | POA: Diagnosis not present

## 2014-02-03 DIAGNOSIS — F17210 Nicotine dependence, cigarettes, uncomplicated: Secondary | ICD-10-CM | POA: Diagnosis not present

## 2014-02-03 DIAGNOSIS — R0689 Other abnormalities of breathing: Secondary | ICD-10-CM | POA: Diagnosis not present

## 2014-02-03 DIAGNOSIS — R918 Other nonspecific abnormal finding of lung field: Secondary | ICD-10-CM | POA: Diagnosis not present

## 2014-02-03 DIAGNOSIS — Z955 Presence of coronary angioplasty implant and graft: Secondary | ICD-10-CM | POA: Diagnosis not present

## 2014-02-03 DIAGNOSIS — Z823 Family history of stroke: Secondary | ICD-10-CM | POA: Diagnosis not present

## 2014-02-03 DIAGNOSIS — J9612 Chronic respiratory failure with hypercapnia: Secondary | ICD-10-CM | POA: Diagnosis not present

## 2014-02-03 DIAGNOSIS — Z881 Allergy status to other antibiotic agents status: Secondary | ICD-10-CM | POA: Diagnosis not present

## 2014-02-03 DIAGNOSIS — J441 Chronic obstructive pulmonary disease with (acute) exacerbation: Secondary | ICD-10-CM | POA: Diagnosis not present

## 2014-02-03 DIAGNOSIS — Z9981 Dependence on supplemental oxygen: Secondary | ICD-10-CM | POA: Diagnosis not present

## 2014-02-03 DIAGNOSIS — J9620 Acute and chronic respiratory failure, unspecified whether with hypoxia or hypercapnia: Secondary | ICD-10-CM | POA: Diagnosis not present

## 2014-02-03 DIAGNOSIS — J9621 Acute and chronic respiratory failure with hypoxia: Secondary | ICD-10-CM | POA: Diagnosis not present

## 2014-02-03 DIAGNOSIS — Z883 Allergy status to other anti-infective agents status: Secondary | ICD-10-CM | POA: Diagnosis not present

## 2014-02-03 DIAGNOSIS — Z79899 Other long term (current) drug therapy: Secondary | ICD-10-CM | POA: Diagnosis not present

## 2014-02-03 DIAGNOSIS — E785 Hyperlipidemia, unspecified: Secondary | ICD-10-CM | POA: Diagnosis not present

## 2014-02-03 DIAGNOSIS — F329 Major depressive disorder, single episode, unspecified: Secondary | ICD-10-CM | POA: Diagnosis not present

## 2014-02-03 LAB — COMPREHENSIVE METABOLIC PANEL
ALBUMIN: 3.5 g/dL (ref 3.4–5.0)
ALK PHOS: 81 U/L
Anion Gap: 8 (ref 7–16)
BILIRUBIN TOTAL: 0.5 mg/dL (ref 0.2–1.0)
BUN: 28 mg/dL — ABNORMAL HIGH (ref 7–18)
CO2: 36 mmol/L — AB (ref 21–32)
Calcium, Total: 9.2 mg/dL (ref 8.5–10.1)
Chloride: 97 mmol/L — ABNORMAL LOW (ref 98–107)
Creatinine: 0.98 mg/dL (ref 0.60–1.30)
EGFR (African American): >60
GFR CALC NON AF AMER: 59 — AB
GLUCOSE: 73 mg/dL (ref 65–99)
OSMOLALITY: 285 (ref 275–301)
Potassium: 3.2 mmol/L — ABNORMAL LOW (ref 3.5–5.1)
SGOT(AST): 26 U/L (ref 15–37)
SGPT (ALT): 25 U/L
SODIUM: 141 mmol/L (ref 136–145)
Total Protein: 7.1 g/dL (ref 6.4–8.2)

## 2014-02-03 LAB — CBC
HCT: 36 % (ref 35.0–47.0)
HGB: 11.5 g/dL — ABNORMAL LOW (ref 12.0–16.0)
MCH: 27.4 pg (ref 26.0–34.0)
MCHC: 32 g/dL (ref 32.0–36.0)
MCV: 86 fL (ref 80–100)
PLATELETS: 459 10*3/uL — AB (ref 150–440)
RBC: 4.21 10*6/uL (ref 3.80–5.20)
RDW: 16.9 % — ABNORMAL HIGH (ref 11.5–14.5)
WBC: 16.3 10*3/uL — ABNORMAL HIGH (ref 3.6–11.0)

## 2014-02-03 LAB — BASIC METABOLIC PANEL
EGFR (African American): >60
EGFR (Non-African Amer.): >60

## 2014-02-03 LAB — CK TOTAL AND CKMB (NOT AT ARMC)
CK, Total: 30 U/L (ref 26–192)
CK-MB: 0.6 ng/mL (ref 0.5–3.6)

## 2014-02-03 LAB — PROTIME-INR
INR: 0.9
Prothrombin Time: 11.6 secs (ref 11.5–14.7)

## 2014-02-03 LAB — TROPONIN I: Troponin-I: <0.02 ng/mL

## 2014-02-03 LAB — PRO B NATRIURETIC PEPTIDE: B-Type Natriuretic Peptide: 263 pg/mL (ref 0–450)

## 2014-02-04 LAB — CBC WITH DIFFERENTIAL/PLATELET
Basophil #: 0 10*3/uL (ref 0.0–0.1)
Basophil %: 0.1 %
EOS ABS: 0 10*3/uL (ref 0.0–0.7)
Eosinophil %: 0 %
HCT: 32.9 % — ABNORMAL LOW (ref 35.0–47.0)
HGB: 10.6 g/dL — ABNORMAL LOW (ref 12.0–16.0)
Lymphocyte #: 0.4 10*3/uL — ABNORMAL LOW (ref 1.0–3.6)
Lymphocyte %: 4.2 %
MCH: 27.6 pg (ref 26.0–34.0)
MCHC: 32.3 g/dL (ref 32.0–36.0)
MCV: 86 fL (ref 80–100)
Monocyte #: 0.1 x10 3/mm — ABNORMAL LOW (ref 0.2–0.9)
Monocyte %: 1.4 %
NEUTROS PCT: 94.3 %
Neutrophil #: 9.9 10*3/uL — ABNORMAL HIGH (ref 1.4–6.5)
PLATELETS: 408 10*3/uL (ref 150–440)
RBC: 3.85 10*6/uL (ref 3.80–5.20)
RDW: 16.9 % — AB (ref 11.5–14.5)
WBC: 10.5 10*3/uL (ref 3.6–11.0)

## 2014-02-04 LAB — BASIC METABOLIC PANEL
Anion Gap: 9 (ref 7–16)
BUN: 24 mg/dL — ABNORMAL HIGH (ref 7–18)
CALCIUM: 8.9 mg/dL (ref 8.5–10.1)
Chloride: 98 mmol/L (ref 98–107)
Co2: 33 mmol/L — ABNORMAL HIGH (ref 21–32)
Creatinine: 0.99 mg/dL (ref 0.60–1.30)
GFR CALC NON AF AMER: 58 — AB
GLUCOSE: 203 mg/dL — AB (ref 65–99)
OSMOLALITY: 289 (ref 275–301)
Potassium: 3.8 mmol/L (ref 3.5–5.1)
Sodium: 140 mmol/L (ref 136–145)

## 2014-02-05 LAB — BASIC METABOLIC PANEL
Anion Gap: 6 — ABNORMAL LOW (ref 7–16)
BUN: 23 mg/dL — ABNORMAL HIGH (ref 7–18)
CALCIUM: 9.3 mg/dL (ref 8.5–10.1)
Chloride: 100 mmol/L (ref 98–107)
Co2: 36 mmol/L — ABNORMAL HIGH (ref 21–32)
Creatinine: 0.95 mg/dL (ref 0.60–1.30)
EGFR (African American): >60
GLUCOSE: 122 mg/dL — AB (ref 65–99)
Osmolality: 288 (ref 275–301)
POTASSIUM: 3.7 mmol/L (ref 3.5–5.1)
SODIUM: 142 mmol/L (ref 136–145)

## 2014-02-05 LAB — HEMOGLOBIN: HGB: 10.9 g/dL — AB (ref 12.0–16.0)

## 2014-02-06 LAB — THEOPHYLLINE LEVEL: THEOPHYLLINE: 8.1 ug/mL — AB (ref 10.0–20.0)

## 2014-02-07 DIAGNOSIS — J441 Chronic obstructive pulmonary disease with (acute) exacerbation: Secondary | ICD-10-CM | POA: Diagnosis not present

## 2014-02-07 DIAGNOSIS — J159 Unspecified bacterial pneumonia: Secondary | ICD-10-CM | POA: Diagnosis not present

## 2014-02-07 DIAGNOSIS — Z720 Tobacco use: Secondary | ICD-10-CM | POA: Diagnosis not present

## 2014-02-07 DIAGNOSIS — G4733 Obstructive sleep apnea (adult) (pediatric): Secondary | ICD-10-CM | POA: Diagnosis not present

## 2014-02-07 DIAGNOSIS — Z9981 Dependence on supplemental oxygen: Secondary | ICD-10-CM | POA: Diagnosis not present

## 2014-02-08 DIAGNOSIS — Z720 Tobacco use: Secondary | ICD-10-CM | POA: Diagnosis not present

## 2014-02-08 DIAGNOSIS — Z9981 Dependence on supplemental oxygen: Secondary | ICD-10-CM | POA: Diagnosis not present

## 2014-02-08 DIAGNOSIS — G4733 Obstructive sleep apnea (adult) (pediatric): Secondary | ICD-10-CM | POA: Diagnosis not present

## 2014-02-08 DIAGNOSIS — J441 Chronic obstructive pulmonary disease with (acute) exacerbation: Secondary | ICD-10-CM | POA: Diagnosis not present

## 2014-02-08 DIAGNOSIS — J159 Unspecified bacterial pneumonia: Secondary | ICD-10-CM | POA: Diagnosis not present

## 2014-02-08 LAB — CULTURE, BLOOD (SINGLE)

## 2014-02-09 ENCOUNTER — Emergency Department: Payer: Self-pay | Admitting: Student

## 2014-02-09 DIAGNOSIS — K862 Cyst of pancreas: Secondary | ICD-10-CM | POA: Diagnosis not present

## 2014-02-09 DIAGNOSIS — K8020 Calculus of gallbladder without cholecystitis without obstruction: Secondary | ICD-10-CM | POA: Diagnosis not present

## 2014-02-09 DIAGNOSIS — Z7982 Long term (current) use of aspirin: Secondary | ICD-10-CM | POA: Diagnosis not present

## 2014-02-09 DIAGNOSIS — Z7951 Long term (current) use of inhaled steroids: Secondary | ICD-10-CM | POA: Diagnosis not present

## 2014-02-09 DIAGNOSIS — I10 Essential (primary) hypertension: Secondary | ICD-10-CM | POA: Diagnosis not present

## 2014-02-09 DIAGNOSIS — Z79899 Other long term (current) drug therapy: Secondary | ICD-10-CM | POA: Diagnosis not present

## 2014-02-09 DIAGNOSIS — Z720 Tobacco use: Secondary | ICD-10-CM | POA: Diagnosis not present

## 2014-02-09 DIAGNOSIS — K623 Rectal prolapse: Secondary | ICD-10-CM | POA: Diagnosis not present

## 2014-02-09 DIAGNOSIS — Z7952 Long term (current) use of systemic steroids: Secondary | ICD-10-CM | POA: Diagnosis not present

## 2014-02-09 DIAGNOSIS — R312 Other microscopic hematuria: Secondary | ICD-10-CM | POA: Diagnosis not present

## 2014-02-09 DIAGNOSIS — R109 Unspecified abdominal pain: Secondary | ICD-10-CM | POA: Diagnosis not present

## 2014-02-09 LAB — CBC
HCT: 33.3 % — ABNORMAL LOW (ref 35.0–47.0)
HGB: 10.9 g/dL — ABNORMAL LOW (ref 12.0–16.0)
MCH: 27.4 pg (ref 26.0–34.0)
MCHC: 32.6 g/dL (ref 32.0–36.0)
MCV: 84 fL (ref 80–100)
Platelet: 351 10*3/uL (ref 150–440)
RBC: 3.97 10*6/uL (ref 3.80–5.20)
RDW: 16.3 % — ABNORMAL HIGH (ref 11.5–14.5)
WBC: 13.4 10*3/uL — ABNORMAL HIGH (ref 3.6–11.0)

## 2014-02-09 LAB — COMPREHENSIVE METABOLIC PANEL
ANION GAP: 7 (ref 7–16)
Albumin: 3.4 g/dL (ref 3.4–5.0)
Alkaline Phosphatase: 78 U/L (ref 46–116)
BILIRUBIN TOTAL: 0.4 mg/dL (ref 0.2–1.0)
BUN: 34 mg/dL — AB (ref 7–18)
CO2: 36 mmol/L — AB (ref 21–32)
CREATININE: 1.23 mg/dL (ref 0.60–1.30)
Calcium, Total: 8.9 mg/dL (ref 8.5–10.1)
Chloride: 98 mmol/L (ref 98–107)
EGFR (Non-African Amer.): 45 — ABNORMAL LOW
GFR CALC AF AMER: 55 — AB
Glucose: 179 mg/dL — ABNORMAL HIGH (ref 65–99)
Osmolality: 293 (ref 275–301)
Potassium: 3.3 mmol/L — ABNORMAL LOW (ref 3.5–5.1)
SGOT(AST): 15 U/L (ref 15–37)
SGPT (ALT): 21 U/L (ref 14–63)
Sodium: 141 mmol/L (ref 136–145)
TOTAL PROTEIN: 6.5 g/dL (ref 6.4–8.2)

## 2014-02-09 LAB — URINALYSIS, COMPLETE
Bacteria: NONE SEEN
Bilirubin,UR: NEGATIVE
Glucose,UR: NEGATIVE mg/dL (ref 0–75)
Ketone: NEGATIVE
Leukocyte Esterase: NEGATIVE
NITRITE: NEGATIVE
PH: 5 (ref 4.5–8.0)
Protein: NEGATIVE
RBC,UR: 15 /HPF (ref 0–5)
SPECIFIC GRAVITY: 1.02 (ref 1.003–1.030)
Squamous Epithelial: 2
WBC UR: 2 /HPF (ref 0–5)

## 2014-02-09 LAB — LIPASE, BLOOD: LIPASE: 305 U/L (ref 73–393)

## 2014-02-09 LAB — TROPONIN I

## 2014-02-11 ENCOUNTER — Emergency Department: Payer: Self-pay | Admitting: Internal Medicine

## 2014-02-11 DIAGNOSIS — K6289 Other specified diseases of anus and rectum: Secondary | ICD-10-CM | POA: Diagnosis not present

## 2014-02-11 DIAGNOSIS — Z7951 Long term (current) use of inhaled steroids: Secondary | ICD-10-CM | POA: Diagnosis not present

## 2014-02-11 DIAGNOSIS — F419 Anxiety disorder, unspecified: Secondary | ICD-10-CM | POA: Diagnosis not present

## 2014-02-11 DIAGNOSIS — K623 Rectal prolapse: Secondary | ICD-10-CM | POA: Diagnosis not present

## 2014-02-11 DIAGNOSIS — Z7982 Long term (current) use of aspirin: Secondary | ICD-10-CM | POA: Diagnosis not present

## 2014-02-11 DIAGNOSIS — Z87891 Personal history of nicotine dependence: Secondary | ICD-10-CM | POA: Diagnosis not present

## 2014-02-11 DIAGNOSIS — Z79899 Other long term (current) drug therapy: Secondary | ICD-10-CM | POA: Diagnosis not present

## 2014-02-11 DIAGNOSIS — Z7952 Long term (current) use of systemic steroids: Secondary | ICD-10-CM | POA: Diagnosis not present

## 2014-02-11 DIAGNOSIS — R918 Other nonspecific abnormal finding of lung field: Secondary | ICD-10-CM | POA: Diagnosis not present

## 2014-02-11 DIAGNOSIS — J441 Chronic obstructive pulmonary disease with (acute) exacerbation: Secondary | ICD-10-CM | POA: Diagnosis not present

## 2014-02-11 LAB — COMPREHENSIVE METABOLIC PANEL
Albumin: 3.3 g/dL — ABNORMAL LOW (ref 3.4–5.0)
Alkaline Phosphatase: 75 U/L (ref 46–116)
Anion Gap: 7 (ref 7–16)
BILIRUBIN TOTAL: 0.5 mg/dL (ref 0.2–1.0)
BUN: 19 mg/dL — ABNORMAL HIGH (ref 7–18)
CHLORIDE: 102 mmol/L (ref 98–107)
CREATININE: 0.77 mg/dL (ref 0.60–1.30)
Calcium, Total: 8.4 mg/dL — ABNORMAL LOW (ref 8.5–10.1)
Co2: 33 mmol/L — ABNORMAL HIGH (ref 21–32)
EGFR (Non-African Amer.): >60
Glucose: 151 mg/dL — ABNORMAL HIGH (ref 65–99)
Osmolality: 288 (ref 275–301)
Potassium: 3.3 mmol/L — ABNORMAL LOW (ref 3.5–5.1)
SGOT(AST): 25 U/L (ref 15–37)
SGPT (ALT): 24 U/L (ref 14–63)
SODIUM: 142 mmol/L (ref 136–145)
Total Protein: 6.4 g/dL (ref 6.4–8.2)

## 2014-02-11 LAB — CBC
HCT: 34.4 % — ABNORMAL LOW (ref 35.0–47.0)
HGB: 10.9 g/dL — AB (ref 12.0–16.0)
MCH: 27.1 pg (ref 26.0–34.0)
MCHC: 31.8 g/dL — ABNORMAL LOW (ref 32.0–36.0)
MCV: 85 fL (ref 80–100)
PLATELETS: 298 10*3/uL (ref 150–440)
RBC: 4.03 10*6/uL (ref 3.80–5.20)
RDW: 16.5 % — ABNORMAL HIGH (ref 11.5–14.5)
WBC: 10.9 10*3/uL (ref 3.6–11.0)

## 2014-02-11 LAB — URINALYSIS, COMPLETE
Bacteria: NONE SEEN
Bilirubin,UR: NEGATIVE
Glucose,UR: NEGATIVE mg/dL (ref 0–75)
KETONE: NEGATIVE
LEUKOCYTE ESTERASE: NEGATIVE
NITRITE: NEGATIVE
PH: 7 (ref 4.5–8.0)
PROTEIN: NEGATIVE
RBC,UR: 9 /HPF (ref 0–5)
Specific Gravity: 1.008 (ref 1.003–1.030)
Squamous Epithelial: <1
WBC UR: 1 /HPF (ref 0–5)

## 2014-02-11 LAB — TROPONIN I: Troponin-I: <0.02 ng/mL

## 2014-02-12 ENCOUNTER — Ambulatory Visit: Payer: Self-pay | Admitting: Internal Medicine

## 2014-02-12 DIAGNOSIS — K623 Rectal prolapse: Secondary | ICD-10-CM | POA: Diagnosis not present

## 2014-02-12 DIAGNOSIS — Z9981 Dependence on supplemental oxygen: Secondary | ICD-10-CM | POA: Diagnosis not present

## 2014-02-12 DIAGNOSIS — J449 Chronic obstructive pulmonary disease, unspecified: Secondary | ICD-10-CM | POA: Diagnosis not present

## 2014-02-13 DIAGNOSIS — Z720 Tobacco use: Secondary | ICD-10-CM | POA: Diagnosis not present

## 2014-02-13 DIAGNOSIS — G4733 Obstructive sleep apnea (adult) (pediatric): Secondary | ICD-10-CM | POA: Diagnosis not present

## 2014-02-13 DIAGNOSIS — J441 Chronic obstructive pulmonary disease with (acute) exacerbation: Secondary | ICD-10-CM | POA: Diagnosis not present

## 2014-02-13 DIAGNOSIS — J159 Unspecified bacterial pneumonia: Secondary | ICD-10-CM | POA: Diagnosis not present

## 2014-02-13 DIAGNOSIS — Z9981 Dependence on supplemental oxygen: Secondary | ICD-10-CM | POA: Diagnosis not present

## 2014-02-16 DIAGNOSIS — G4733 Obstructive sleep apnea (adult) (pediatric): Secondary | ICD-10-CM | POA: Diagnosis not present

## 2014-02-16 DIAGNOSIS — J209 Acute bronchitis, unspecified: Secondary | ICD-10-CM | POA: Diagnosis not present

## 2014-02-16 DIAGNOSIS — J440 Chronic obstructive pulmonary disease with acute lower respiratory infection: Secondary | ICD-10-CM | POA: Diagnosis not present

## 2014-02-16 DIAGNOSIS — Z720 Tobacco use: Secondary | ICD-10-CM | POA: Diagnosis not present

## 2014-02-16 DIAGNOSIS — F329 Major depressive disorder, single episode, unspecified: Secondary | ICD-10-CM | POA: Diagnosis not present

## 2014-02-16 DIAGNOSIS — Z9981 Dependence on supplemental oxygen: Secondary | ICD-10-CM | POA: Diagnosis not present

## 2014-02-16 DIAGNOSIS — F418 Other specified anxiety disorders: Secondary | ICD-10-CM | POA: Diagnosis not present

## 2014-02-16 DIAGNOSIS — F17210 Nicotine dependence, cigarettes, uncomplicated: Secondary | ICD-10-CM | POA: Diagnosis not present

## 2014-02-16 DIAGNOSIS — I2510 Atherosclerotic heart disease of native coronary artery without angina pectoris: Secondary | ICD-10-CM | POA: Diagnosis not present

## 2014-02-16 DIAGNOSIS — J441 Chronic obstructive pulmonary disease with (acute) exacerbation: Secondary | ICD-10-CM | POA: Diagnosis not present

## 2014-02-16 DIAGNOSIS — J159 Unspecified bacterial pneumonia: Secondary | ICD-10-CM | POA: Diagnosis not present

## 2014-02-20 DIAGNOSIS — J159 Unspecified bacterial pneumonia: Secondary | ICD-10-CM | POA: Diagnosis not present

## 2014-02-20 DIAGNOSIS — Z720 Tobacco use: Secondary | ICD-10-CM | POA: Diagnosis not present

## 2014-02-20 DIAGNOSIS — G4733 Obstructive sleep apnea (adult) (pediatric): Secondary | ICD-10-CM | POA: Diagnosis not present

## 2014-02-20 DIAGNOSIS — J441 Chronic obstructive pulmonary disease with (acute) exacerbation: Secondary | ICD-10-CM | POA: Diagnosis not present

## 2014-02-20 DIAGNOSIS — Z9981 Dependence on supplemental oxygen: Secondary | ICD-10-CM | POA: Diagnosis not present

## 2014-02-21 DIAGNOSIS — R0602 Shortness of breath: Secondary | ICD-10-CM | POA: Diagnosis not present

## 2014-02-21 DIAGNOSIS — J9611 Chronic respiratory failure with hypoxia: Secondary | ICD-10-CM | POA: Diagnosis not present

## 2014-02-21 DIAGNOSIS — G479 Sleep disorder, unspecified: Secondary | ICD-10-CM | POA: Diagnosis not present

## 2014-02-21 DIAGNOSIS — J449 Chronic obstructive pulmonary disease, unspecified: Secondary | ICD-10-CM | POA: Diagnosis not present

## 2014-02-21 DIAGNOSIS — F17211 Nicotine dependence, cigarettes, in remission: Secondary | ICD-10-CM | POA: Diagnosis not present

## 2014-02-23 ENCOUNTER — Inpatient Hospital Stay: Payer: Self-pay | Admitting: Internal Medicine

## 2014-02-23 DIAGNOSIS — R0600 Dyspnea, unspecified: Secondary | ICD-10-CM | POA: Diagnosis not present

## 2014-02-23 DIAGNOSIS — J159 Unspecified bacterial pneumonia: Secondary | ICD-10-CM | POA: Diagnosis not present

## 2014-02-23 DIAGNOSIS — R918 Other nonspecific abnormal finding of lung field: Secondary | ICD-10-CM | POA: Diagnosis not present

## 2014-02-23 DIAGNOSIS — R05 Cough: Secondary | ICD-10-CM | POA: Diagnosis not present

## 2014-02-23 DIAGNOSIS — F419 Anxiety disorder, unspecified: Secondary | ICD-10-CM | POA: Diagnosis not present

## 2014-02-23 DIAGNOSIS — R0689 Other abnormalities of breathing: Secondary | ICD-10-CM | POA: Diagnosis not present

## 2014-02-23 DIAGNOSIS — J9620 Acute and chronic respiratory failure, unspecified whether with hypoxia or hypercapnia: Secondary | ICD-10-CM | POA: Diagnosis not present

## 2014-02-23 DIAGNOSIS — I2510 Atherosclerotic heart disease of native coronary artery without angina pectoris: Secondary | ICD-10-CM | POA: Diagnosis not present

## 2014-02-23 DIAGNOSIS — E039 Hypothyroidism, unspecified: Secondary | ICD-10-CM | POA: Diagnosis not present

## 2014-02-23 DIAGNOSIS — J9621 Acute and chronic respiratory failure with hypoxia: Secondary | ICD-10-CM | POA: Diagnosis not present

## 2014-02-23 DIAGNOSIS — Z955 Presence of coronary angioplasty implant and graft: Secondary | ICD-10-CM | POA: Diagnosis not present

## 2014-02-23 DIAGNOSIS — F329 Major depressive disorder, single episode, unspecified: Secondary | ICD-10-CM | POA: Diagnosis not present

## 2014-02-23 DIAGNOSIS — Z79899 Other long term (current) drug therapy: Secondary | ICD-10-CM | POA: Diagnosis not present

## 2014-02-23 DIAGNOSIS — J441 Chronic obstructive pulmonary disease with (acute) exacerbation: Secondary | ICD-10-CM | POA: Diagnosis not present

## 2014-02-23 DIAGNOSIS — G4733 Obstructive sleep apnea (adult) (pediatric): Secondary | ICD-10-CM | POA: Diagnosis not present

## 2014-02-23 DIAGNOSIS — Z66 Do not resuscitate: Secondary | ICD-10-CM | POA: Diagnosis not present

## 2014-02-23 DIAGNOSIS — Z720 Tobacco use: Secondary | ICD-10-CM | POA: Diagnosis not present

## 2014-02-23 DIAGNOSIS — E876 Hypokalemia: Secondary | ICD-10-CM | POA: Diagnosis not present

## 2014-02-23 DIAGNOSIS — F17200 Nicotine dependence, unspecified, uncomplicated: Secondary | ICD-10-CM | POA: Diagnosis not present

## 2014-02-23 DIAGNOSIS — F17210 Nicotine dependence, cigarettes, uncomplicated: Secondary | ICD-10-CM | POA: Diagnosis not present

## 2014-02-23 DIAGNOSIS — D649 Anemia, unspecified: Secondary | ICD-10-CM | POA: Diagnosis not present

## 2014-02-23 DIAGNOSIS — Z9981 Dependence on supplemental oxygen: Secondary | ICD-10-CM | POA: Diagnosis not present

## 2014-02-23 DIAGNOSIS — I10 Essential (primary) hypertension: Secondary | ICD-10-CM | POA: Diagnosis not present

## 2014-02-23 DIAGNOSIS — E785 Hyperlipidemia, unspecified: Secondary | ICD-10-CM | POA: Diagnosis not present

## 2014-02-23 DIAGNOSIS — Z7982 Long term (current) use of aspirin: Secondary | ICD-10-CM | POA: Diagnosis not present

## 2014-02-23 DIAGNOSIS — R319 Hematuria, unspecified: Secondary | ICD-10-CM | POA: Diagnosis not present

## 2014-02-27 DIAGNOSIS — J159 Unspecified bacterial pneumonia: Secondary | ICD-10-CM | POA: Diagnosis not present

## 2014-02-27 DIAGNOSIS — Z720 Tobacco use: Secondary | ICD-10-CM | POA: Diagnosis not present

## 2014-02-27 DIAGNOSIS — J441 Chronic obstructive pulmonary disease with (acute) exacerbation: Secondary | ICD-10-CM | POA: Diagnosis not present

## 2014-02-27 DIAGNOSIS — G4733 Obstructive sleep apnea (adult) (pediatric): Secondary | ICD-10-CM | POA: Diagnosis not present

## 2014-02-27 DIAGNOSIS — Z9981 Dependence on supplemental oxygen: Secondary | ICD-10-CM | POA: Diagnosis not present

## 2014-02-28 DIAGNOSIS — Z9981 Dependence on supplemental oxygen: Secondary | ICD-10-CM | POA: Diagnosis not present

## 2014-02-28 DIAGNOSIS — J209 Acute bronchitis, unspecified: Secondary | ICD-10-CM | POA: Diagnosis not present

## 2014-02-28 DIAGNOSIS — G4733 Obstructive sleep apnea (adult) (pediatric): Secondary | ICD-10-CM | POA: Diagnosis not present

## 2014-02-28 DIAGNOSIS — F17210 Nicotine dependence, cigarettes, uncomplicated: Secondary | ICD-10-CM | POA: Diagnosis not present

## 2014-02-28 DIAGNOSIS — J441 Chronic obstructive pulmonary disease with (acute) exacerbation: Secondary | ICD-10-CM | POA: Diagnosis not present

## 2014-02-28 DIAGNOSIS — I2510 Atherosclerotic heart disease of native coronary artery without angina pectoris: Secondary | ICD-10-CM | POA: Diagnosis not present

## 2014-02-28 DIAGNOSIS — J440 Chronic obstructive pulmonary disease with acute lower respiratory infection: Secondary | ICD-10-CM | POA: Diagnosis not present

## 2014-02-28 DIAGNOSIS — F329 Major depressive disorder, single episode, unspecified: Secondary | ICD-10-CM | POA: Diagnosis not present

## 2014-02-28 DIAGNOSIS — F418 Other specified anxiety disorders: Secondary | ICD-10-CM | POA: Diagnosis not present

## 2014-03-01 DIAGNOSIS — J449 Chronic obstructive pulmonary disease, unspecified: Secondary | ICD-10-CM | POA: Diagnosis not present

## 2014-03-01 DIAGNOSIS — E039 Hypothyroidism, unspecified: Secondary | ICD-10-CM | POA: Diagnosis not present

## 2014-03-01 DIAGNOSIS — D649 Anemia, unspecified: Secondary | ICD-10-CM | POA: Diagnosis not present

## 2014-03-01 DIAGNOSIS — R0902 Hypoxemia: Secondary | ICD-10-CM | POA: Diagnosis not present

## 2014-03-01 DIAGNOSIS — J9690 Respiratory failure, unspecified, unspecified whether with hypoxia or hypercapnia: Secondary | ICD-10-CM | POA: Diagnosis not present

## 2014-03-01 DIAGNOSIS — J209 Acute bronchitis, unspecified: Secondary | ICD-10-CM | POA: Diagnosis not present

## 2014-03-01 DIAGNOSIS — E786 Lipoprotein deficiency: Secondary | ICD-10-CM | POA: Diagnosis not present

## 2014-03-02 DIAGNOSIS — G4733 Obstructive sleep apnea (adult) (pediatric): Secondary | ICD-10-CM | POA: Diagnosis not present

## 2014-03-02 DIAGNOSIS — J209 Acute bronchitis, unspecified: Secondary | ICD-10-CM | POA: Diagnosis not present

## 2014-03-02 DIAGNOSIS — Z9981 Dependence on supplemental oxygen: Secondary | ICD-10-CM | POA: Diagnosis not present

## 2014-03-02 DIAGNOSIS — E876 Hypokalemia: Secondary | ICD-10-CM | POA: Diagnosis not present

## 2014-03-02 DIAGNOSIS — I2510 Atherosclerotic heart disease of native coronary artery without angina pectoris: Secondary | ICD-10-CM | POA: Diagnosis not present

## 2014-03-02 DIAGNOSIS — J440 Chronic obstructive pulmonary disease with acute lower respiratory infection: Secondary | ICD-10-CM | POA: Diagnosis not present

## 2014-03-02 DIAGNOSIS — J441 Chronic obstructive pulmonary disease with (acute) exacerbation: Secondary | ICD-10-CM | POA: Diagnosis not present

## 2014-03-02 DIAGNOSIS — E039 Hypothyroidism, unspecified: Secondary | ICD-10-CM | POA: Diagnosis not present

## 2014-03-02 DIAGNOSIS — F17210 Nicotine dependence, cigarettes, uncomplicated: Secondary | ICD-10-CM | POA: Diagnosis not present

## 2014-03-02 DIAGNOSIS — F329 Major depressive disorder, single episode, unspecified: Secondary | ICD-10-CM | POA: Diagnosis not present

## 2014-03-02 DIAGNOSIS — D649 Anemia, unspecified: Secondary | ICD-10-CM | POA: Diagnosis not present

## 2014-03-02 DIAGNOSIS — F418 Other specified anxiety disorders: Secondary | ICD-10-CM | POA: Diagnosis not present

## 2014-03-05 DIAGNOSIS — K623 Rectal prolapse: Secondary | ICD-10-CM | POA: Diagnosis not present

## 2014-03-06 DIAGNOSIS — J440 Chronic obstructive pulmonary disease with acute lower respiratory infection: Secondary | ICD-10-CM | POA: Diagnosis not present

## 2014-03-06 DIAGNOSIS — J209 Acute bronchitis, unspecified: Secondary | ICD-10-CM | POA: Diagnosis not present

## 2014-03-06 DIAGNOSIS — Z9981 Dependence on supplemental oxygen: Secondary | ICD-10-CM | POA: Diagnosis not present

## 2014-03-06 DIAGNOSIS — I2510 Atherosclerotic heart disease of native coronary artery without angina pectoris: Secondary | ICD-10-CM | POA: Diagnosis not present

## 2014-03-06 DIAGNOSIS — G4733 Obstructive sleep apnea (adult) (pediatric): Secondary | ICD-10-CM | POA: Diagnosis not present

## 2014-03-06 DIAGNOSIS — F17210 Nicotine dependence, cigarettes, uncomplicated: Secondary | ICD-10-CM | POA: Diagnosis not present

## 2014-03-06 DIAGNOSIS — F418 Other specified anxiety disorders: Secondary | ICD-10-CM | POA: Diagnosis not present

## 2014-03-06 DIAGNOSIS — J441 Chronic obstructive pulmonary disease with (acute) exacerbation: Secondary | ICD-10-CM | POA: Diagnosis not present

## 2014-03-06 DIAGNOSIS — F329 Major depressive disorder, single episode, unspecified: Secondary | ICD-10-CM | POA: Diagnosis not present

## 2014-03-09 DIAGNOSIS — E780 Pure hypercholesterolemia: Secondary | ICD-10-CM | POA: Diagnosis not present

## 2014-03-09 DIAGNOSIS — I2510 Atherosclerotic heart disease of native coronary artery without angina pectoris: Secondary | ICD-10-CM | POA: Diagnosis not present

## 2014-03-09 DIAGNOSIS — I10 Essential (primary) hypertension: Secondary | ICD-10-CM | POA: Diagnosis not present

## 2014-03-10 DIAGNOSIS — F418 Other specified anxiety disorders: Secondary | ICD-10-CM | POA: Diagnosis not present

## 2014-03-10 DIAGNOSIS — J440 Chronic obstructive pulmonary disease with acute lower respiratory infection: Secondary | ICD-10-CM | POA: Diagnosis not present

## 2014-03-10 DIAGNOSIS — Z9981 Dependence on supplemental oxygen: Secondary | ICD-10-CM | POA: Diagnosis not present

## 2014-03-10 DIAGNOSIS — I2510 Atherosclerotic heart disease of native coronary artery without angina pectoris: Secondary | ICD-10-CM | POA: Diagnosis not present

## 2014-03-10 DIAGNOSIS — G4733 Obstructive sleep apnea (adult) (pediatric): Secondary | ICD-10-CM | POA: Diagnosis not present

## 2014-03-10 DIAGNOSIS — J441 Chronic obstructive pulmonary disease with (acute) exacerbation: Secondary | ICD-10-CM | POA: Diagnosis not present

## 2014-03-10 DIAGNOSIS — J209 Acute bronchitis, unspecified: Secondary | ICD-10-CM | POA: Diagnosis not present

## 2014-03-10 DIAGNOSIS — F17210 Nicotine dependence, cigarettes, uncomplicated: Secondary | ICD-10-CM | POA: Diagnosis not present

## 2014-03-10 DIAGNOSIS — F329 Major depressive disorder, single episode, unspecified: Secondary | ICD-10-CM | POA: Diagnosis not present

## 2014-03-12 DIAGNOSIS — G4733 Obstructive sleep apnea (adult) (pediatric): Secondary | ICD-10-CM | POA: Diagnosis not present

## 2014-03-12 DIAGNOSIS — F329 Major depressive disorder, single episode, unspecified: Secondary | ICD-10-CM | POA: Diagnosis not present

## 2014-03-12 DIAGNOSIS — Z9981 Dependence on supplemental oxygen: Secondary | ICD-10-CM | POA: Diagnosis not present

## 2014-03-12 DIAGNOSIS — J441 Chronic obstructive pulmonary disease with (acute) exacerbation: Secondary | ICD-10-CM | POA: Diagnosis not present

## 2014-03-12 DIAGNOSIS — I2510 Atherosclerotic heart disease of native coronary artery without angina pectoris: Secondary | ICD-10-CM | POA: Diagnosis not present

## 2014-03-12 DIAGNOSIS — F17210 Nicotine dependence, cigarettes, uncomplicated: Secondary | ICD-10-CM | POA: Diagnosis not present

## 2014-03-12 DIAGNOSIS — J440 Chronic obstructive pulmonary disease with acute lower respiratory infection: Secondary | ICD-10-CM | POA: Diagnosis not present

## 2014-03-12 DIAGNOSIS — F418 Other specified anxiety disorders: Secondary | ICD-10-CM | POA: Diagnosis not present

## 2014-03-12 DIAGNOSIS — J209 Acute bronchitis, unspecified: Secondary | ICD-10-CM | POA: Diagnosis not present

## 2014-03-14 DIAGNOSIS — R0602 Shortness of breath: Secondary | ICD-10-CM | POA: Diagnosis not present

## 2014-03-16 DIAGNOSIS — F418 Other specified anxiety disorders: Secondary | ICD-10-CM | POA: Diagnosis not present

## 2014-03-16 DIAGNOSIS — I2510 Atherosclerotic heart disease of native coronary artery without angina pectoris: Secondary | ICD-10-CM | POA: Diagnosis not present

## 2014-03-16 DIAGNOSIS — J441 Chronic obstructive pulmonary disease with (acute) exacerbation: Secondary | ICD-10-CM | POA: Diagnosis not present

## 2014-03-16 DIAGNOSIS — G4733 Obstructive sleep apnea (adult) (pediatric): Secondary | ICD-10-CM | POA: Diagnosis not present

## 2014-03-16 DIAGNOSIS — J440 Chronic obstructive pulmonary disease with acute lower respiratory infection: Secondary | ICD-10-CM | POA: Diagnosis not present

## 2014-03-16 DIAGNOSIS — F329 Major depressive disorder, single episode, unspecified: Secondary | ICD-10-CM | POA: Diagnosis not present

## 2014-03-16 DIAGNOSIS — Z9981 Dependence on supplemental oxygen: Secondary | ICD-10-CM | POA: Diagnosis not present

## 2014-03-16 DIAGNOSIS — J209 Acute bronchitis, unspecified: Secondary | ICD-10-CM | POA: Diagnosis not present

## 2014-03-16 DIAGNOSIS — F17210 Nicotine dependence, cigarettes, uncomplicated: Secondary | ICD-10-CM | POA: Diagnosis not present

## 2014-03-19 DIAGNOSIS — F17210 Nicotine dependence, cigarettes, uncomplicated: Secondary | ICD-10-CM | POA: Diagnosis not present

## 2014-03-19 DIAGNOSIS — J209 Acute bronchitis, unspecified: Secondary | ICD-10-CM | POA: Diagnosis not present

## 2014-03-19 DIAGNOSIS — I2510 Atherosclerotic heart disease of native coronary artery without angina pectoris: Secondary | ICD-10-CM | POA: Diagnosis not present

## 2014-03-19 DIAGNOSIS — F329 Major depressive disorder, single episode, unspecified: Secondary | ICD-10-CM | POA: Diagnosis not present

## 2014-03-19 DIAGNOSIS — Z9981 Dependence on supplemental oxygen: Secondary | ICD-10-CM | POA: Diagnosis not present

## 2014-03-19 DIAGNOSIS — G4733 Obstructive sleep apnea (adult) (pediatric): Secondary | ICD-10-CM | POA: Diagnosis not present

## 2014-03-19 DIAGNOSIS — J441 Chronic obstructive pulmonary disease with (acute) exacerbation: Secondary | ICD-10-CM | POA: Diagnosis not present

## 2014-03-19 DIAGNOSIS — F418 Other specified anxiety disorders: Secondary | ICD-10-CM | POA: Diagnosis not present

## 2014-03-19 DIAGNOSIS — J440 Chronic obstructive pulmonary disease with acute lower respiratory infection: Secondary | ICD-10-CM | POA: Diagnosis not present

## 2014-03-23 DIAGNOSIS — I2510 Atherosclerotic heart disease of native coronary artery without angina pectoris: Secondary | ICD-10-CM | POA: Diagnosis not present

## 2014-03-23 DIAGNOSIS — J440 Chronic obstructive pulmonary disease with acute lower respiratory infection: Secondary | ICD-10-CM | POA: Diagnosis not present

## 2014-03-23 DIAGNOSIS — F329 Major depressive disorder, single episode, unspecified: Secondary | ICD-10-CM | POA: Diagnosis not present

## 2014-03-23 DIAGNOSIS — J209 Acute bronchitis, unspecified: Secondary | ICD-10-CM | POA: Diagnosis not present

## 2014-03-23 DIAGNOSIS — F17210 Nicotine dependence, cigarettes, uncomplicated: Secondary | ICD-10-CM | POA: Diagnosis not present

## 2014-03-23 DIAGNOSIS — Z9981 Dependence on supplemental oxygen: Secondary | ICD-10-CM | POA: Diagnosis not present

## 2014-03-23 DIAGNOSIS — F418 Other specified anxiety disorders: Secondary | ICD-10-CM | POA: Diagnosis not present

## 2014-03-23 DIAGNOSIS — G4733 Obstructive sleep apnea (adult) (pediatric): Secondary | ICD-10-CM | POA: Diagnosis not present

## 2014-03-23 DIAGNOSIS — J441 Chronic obstructive pulmonary disease with (acute) exacerbation: Secondary | ICD-10-CM | POA: Diagnosis not present

## 2014-03-26 DIAGNOSIS — F418 Other specified anxiety disorders: Secondary | ICD-10-CM | POA: Diagnosis not present

## 2014-03-26 DIAGNOSIS — Z9981 Dependence on supplemental oxygen: Secondary | ICD-10-CM | POA: Diagnosis not present

## 2014-03-26 DIAGNOSIS — F17210 Nicotine dependence, cigarettes, uncomplicated: Secondary | ICD-10-CM | POA: Diagnosis not present

## 2014-03-26 DIAGNOSIS — I2510 Atherosclerotic heart disease of native coronary artery without angina pectoris: Secondary | ICD-10-CM | POA: Diagnosis not present

## 2014-03-26 DIAGNOSIS — G4733 Obstructive sleep apnea (adult) (pediatric): Secondary | ICD-10-CM | POA: Diagnosis not present

## 2014-03-26 DIAGNOSIS — J209 Acute bronchitis, unspecified: Secondary | ICD-10-CM | POA: Diagnosis not present

## 2014-03-26 DIAGNOSIS — J441 Chronic obstructive pulmonary disease with (acute) exacerbation: Secondary | ICD-10-CM | POA: Diagnosis not present

## 2014-03-26 DIAGNOSIS — F329 Major depressive disorder, single episode, unspecified: Secondary | ICD-10-CM | POA: Diagnosis not present

## 2014-03-26 DIAGNOSIS — J440 Chronic obstructive pulmonary disease with acute lower respiratory infection: Secondary | ICD-10-CM | POA: Diagnosis not present

## 2014-03-27 DIAGNOSIS — R0602 Shortness of breath: Secondary | ICD-10-CM | POA: Diagnosis not present

## 2014-03-27 DIAGNOSIS — J449 Chronic obstructive pulmonary disease, unspecified: Secondary | ICD-10-CM | POA: Diagnosis not present

## 2014-03-27 DIAGNOSIS — Z23 Encounter for immunization: Secondary | ICD-10-CM | POA: Diagnosis not present

## 2014-03-27 DIAGNOSIS — J9611 Chronic respiratory failure with hypoxia: Secondary | ICD-10-CM | POA: Diagnosis not present

## 2014-03-28 DIAGNOSIS — Z9981 Dependence on supplemental oxygen: Secondary | ICD-10-CM | POA: Diagnosis not present

## 2014-03-28 DIAGNOSIS — F17210 Nicotine dependence, cigarettes, uncomplicated: Secondary | ICD-10-CM | POA: Diagnosis not present

## 2014-03-28 DIAGNOSIS — F329 Major depressive disorder, single episode, unspecified: Secondary | ICD-10-CM | POA: Diagnosis not present

## 2014-03-28 DIAGNOSIS — F418 Other specified anxiety disorders: Secondary | ICD-10-CM | POA: Diagnosis not present

## 2014-03-28 DIAGNOSIS — G4733 Obstructive sleep apnea (adult) (pediatric): Secondary | ICD-10-CM | POA: Diagnosis not present

## 2014-03-28 DIAGNOSIS — J440 Chronic obstructive pulmonary disease with acute lower respiratory infection: Secondary | ICD-10-CM | POA: Diagnosis not present

## 2014-03-28 DIAGNOSIS — J209 Acute bronchitis, unspecified: Secondary | ICD-10-CM | POA: Diagnosis not present

## 2014-03-28 DIAGNOSIS — J441 Chronic obstructive pulmonary disease with (acute) exacerbation: Secondary | ICD-10-CM | POA: Diagnosis not present

## 2014-03-28 DIAGNOSIS — I2510 Atherosclerotic heart disease of native coronary artery without angina pectoris: Secondary | ICD-10-CM | POA: Diagnosis not present

## 2014-03-30 DIAGNOSIS — J209 Acute bronchitis, unspecified: Secondary | ICD-10-CM | POA: Diagnosis not present

## 2014-03-30 DIAGNOSIS — R0902 Hypoxemia: Secondary | ICD-10-CM | POA: Diagnosis not present

## 2014-03-30 DIAGNOSIS — J9690 Respiratory failure, unspecified, unspecified whether with hypoxia or hypercapnia: Secondary | ICD-10-CM | POA: Diagnosis not present

## 2014-04-02 DIAGNOSIS — J209 Acute bronchitis, unspecified: Secondary | ICD-10-CM | POA: Diagnosis not present

## 2014-04-02 DIAGNOSIS — F17210 Nicotine dependence, cigarettes, uncomplicated: Secondary | ICD-10-CM | POA: Diagnosis not present

## 2014-04-02 DIAGNOSIS — D649 Anemia, unspecified: Secondary | ICD-10-CM | POA: Diagnosis not present

## 2014-04-02 DIAGNOSIS — I2510 Atherosclerotic heart disease of native coronary artery without angina pectoris: Secondary | ICD-10-CM | POA: Diagnosis not present

## 2014-04-02 DIAGNOSIS — F418 Other specified anxiety disorders: Secondary | ICD-10-CM | POA: Diagnosis not present

## 2014-04-02 DIAGNOSIS — J440 Chronic obstructive pulmonary disease with acute lower respiratory infection: Secondary | ICD-10-CM | POA: Diagnosis not present

## 2014-04-02 DIAGNOSIS — J441 Chronic obstructive pulmonary disease with (acute) exacerbation: Secondary | ICD-10-CM | POA: Diagnosis not present

## 2014-04-02 DIAGNOSIS — F329 Major depressive disorder, single episode, unspecified: Secondary | ICD-10-CM | POA: Diagnosis not present

## 2014-04-02 DIAGNOSIS — E876 Hypokalemia: Secondary | ICD-10-CM | POA: Diagnosis not present

## 2014-04-02 DIAGNOSIS — G4733 Obstructive sleep apnea (adult) (pediatric): Secondary | ICD-10-CM | POA: Diagnosis not present

## 2014-04-02 DIAGNOSIS — J9620 Acute and chronic respiratory failure, unspecified whether with hypoxia or hypercapnia: Secondary | ICD-10-CM | POA: Diagnosis not present

## 2014-04-02 DIAGNOSIS — Z9981 Dependence on supplemental oxygen: Secondary | ICD-10-CM | POA: Diagnosis not present

## 2014-04-04 DIAGNOSIS — E876 Hypokalemia: Secondary | ICD-10-CM | POA: Diagnosis not present

## 2014-04-04 DIAGNOSIS — D649 Anemia, unspecified: Secondary | ICD-10-CM | POA: Diagnosis not present

## 2014-04-04 DIAGNOSIS — E039 Hypothyroidism, unspecified: Secondary | ICD-10-CM | POA: Diagnosis not present

## 2014-04-04 LAB — BASIC METABOLIC PANEL
BUN: 16 mg/dL (ref 4–21)
GLUCOSE: 82 mg/dL
Potassium: 3.9 mmol/L (ref 3.4–5.3)
Sodium: 146 mmol/L (ref 137–147)

## 2014-04-04 LAB — CBC AND DIFFERENTIAL
HCT: 32 % — AB (ref 36–46)
Hemoglobin: 10.1 g/dL — AB (ref 12.0–16.0)
PLATELETS: 440 10*3/uL — AB (ref 150–399)
WBC: 7.1 10^3/mL

## 2014-04-04 LAB — HEPATIC FUNCTION PANEL
ALT: 12 U/L (ref 7–35)
AST: 21 U/L (ref 13–35)

## 2014-04-04 LAB — TSH: TSH: 1.48 u[IU]/mL (ref 0.41–5.90)

## 2014-04-06 DIAGNOSIS — F418 Other specified anxiety disorders: Secondary | ICD-10-CM | POA: Diagnosis not present

## 2014-04-06 DIAGNOSIS — G4733 Obstructive sleep apnea (adult) (pediatric): Secondary | ICD-10-CM | POA: Diagnosis not present

## 2014-04-06 DIAGNOSIS — E876 Hypokalemia: Secondary | ICD-10-CM | POA: Diagnosis not present

## 2014-04-06 DIAGNOSIS — J441 Chronic obstructive pulmonary disease with (acute) exacerbation: Secondary | ICD-10-CM | POA: Diagnosis not present

## 2014-04-06 DIAGNOSIS — Z9981 Dependence on supplemental oxygen: Secondary | ICD-10-CM | POA: Diagnosis not present

## 2014-04-06 DIAGNOSIS — I2510 Atherosclerotic heart disease of native coronary artery without angina pectoris: Secondary | ICD-10-CM | POA: Diagnosis not present

## 2014-04-06 DIAGNOSIS — D649 Anemia, unspecified: Secondary | ICD-10-CM | POA: Diagnosis not present

## 2014-04-06 DIAGNOSIS — F17210 Nicotine dependence, cigarettes, uncomplicated: Secondary | ICD-10-CM | POA: Diagnosis not present

## 2014-04-06 DIAGNOSIS — F329 Major depressive disorder, single episode, unspecified: Secondary | ICD-10-CM | POA: Diagnosis not present

## 2014-04-06 DIAGNOSIS — J9620 Acute and chronic respiratory failure, unspecified whether with hypoxia or hypercapnia: Secondary | ICD-10-CM | POA: Diagnosis not present

## 2014-04-06 DIAGNOSIS — J209 Acute bronchitis, unspecified: Secondary | ICD-10-CM | POA: Diagnosis not present

## 2014-04-06 DIAGNOSIS — J440 Chronic obstructive pulmonary disease with acute lower respiratory infection: Secondary | ICD-10-CM | POA: Diagnosis not present

## 2014-04-09 DIAGNOSIS — J9620 Acute and chronic respiratory failure, unspecified whether with hypoxia or hypercapnia: Secondary | ICD-10-CM | POA: Diagnosis not present

## 2014-04-09 DIAGNOSIS — F418 Other specified anxiety disorders: Secondary | ICD-10-CM | POA: Diagnosis not present

## 2014-04-09 DIAGNOSIS — D649 Anemia, unspecified: Secondary | ICD-10-CM | POA: Diagnosis not present

## 2014-04-09 DIAGNOSIS — J440 Chronic obstructive pulmonary disease with acute lower respiratory infection: Secondary | ICD-10-CM | POA: Diagnosis not present

## 2014-04-09 DIAGNOSIS — F329 Major depressive disorder, single episode, unspecified: Secondary | ICD-10-CM | POA: Diagnosis not present

## 2014-04-09 DIAGNOSIS — Z9981 Dependence on supplemental oxygen: Secondary | ICD-10-CM | POA: Diagnosis not present

## 2014-04-09 DIAGNOSIS — F17210 Nicotine dependence, cigarettes, uncomplicated: Secondary | ICD-10-CM | POA: Diagnosis not present

## 2014-04-09 DIAGNOSIS — J441 Chronic obstructive pulmonary disease with (acute) exacerbation: Secondary | ICD-10-CM | POA: Diagnosis not present

## 2014-04-09 DIAGNOSIS — G4733 Obstructive sleep apnea (adult) (pediatric): Secondary | ICD-10-CM | POA: Diagnosis not present

## 2014-04-09 DIAGNOSIS — E876 Hypokalemia: Secondary | ICD-10-CM | POA: Diagnosis not present

## 2014-04-09 DIAGNOSIS — J209 Acute bronchitis, unspecified: Secondary | ICD-10-CM | POA: Diagnosis not present

## 2014-04-09 DIAGNOSIS — I2510 Atherosclerotic heart disease of native coronary artery without angina pectoris: Secondary | ICD-10-CM | POA: Diagnosis not present

## 2014-04-12 DIAGNOSIS — F418 Other specified anxiety disorders: Secondary | ICD-10-CM | POA: Diagnosis not present

## 2014-04-12 DIAGNOSIS — J9620 Acute and chronic respiratory failure, unspecified whether with hypoxia or hypercapnia: Secondary | ICD-10-CM | POA: Diagnosis not present

## 2014-04-12 DIAGNOSIS — Z9981 Dependence on supplemental oxygen: Secondary | ICD-10-CM | POA: Diagnosis not present

## 2014-04-12 DIAGNOSIS — E876 Hypokalemia: Secondary | ICD-10-CM | POA: Diagnosis not present

## 2014-04-12 DIAGNOSIS — I2510 Atherosclerotic heart disease of native coronary artery without angina pectoris: Secondary | ICD-10-CM | POA: Diagnosis not present

## 2014-04-12 DIAGNOSIS — J440 Chronic obstructive pulmonary disease with acute lower respiratory infection: Secondary | ICD-10-CM | POA: Diagnosis not present

## 2014-04-12 DIAGNOSIS — J441 Chronic obstructive pulmonary disease with (acute) exacerbation: Secondary | ICD-10-CM | POA: Diagnosis not present

## 2014-04-12 DIAGNOSIS — J209 Acute bronchitis, unspecified: Secondary | ICD-10-CM | POA: Diagnosis not present

## 2014-04-12 DIAGNOSIS — D649 Anemia, unspecified: Secondary | ICD-10-CM | POA: Diagnosis not present

## 2014-04-12 DIAGNOSIS — F329 Major depressive disorder, single episode, unspecified: Secondary | ICD-10-CM | POA: Diagnosis not present

## 2014-04-12 DIAGNOSIS — G4733 Obstructive sleep apnea (adult) (pediatric): Secondary | ICD-10-CM | POA: Diagnosis not present

## 2014-04-12 DIAGNOSIS — F17210 Nicotine dependence, cigarettes, uncomplicated: Secondary | ICD-10-CM | POA: Diagnosis not present

## 2014-04-16 DIAGNOSIS — Z9981 Dependence on supplemental oxygen: Secondary | ICD-10-CM | POA: Diagnosis not present

## 2014-04-16 DIAGNOSIS — J209 Acute bronchitis, unspecified: Secondary | ICD-10-CM | POA: Diagnosis not present

## 2014-04-16 DIAGNOSIS — G4733 Obstructive sleep apnea (adult) (pediatric): Secondary | ICD-10-CM | POA: Diagnosis not present

## 2014-04-16 DIAGNOSIS — J441 Chronic obstructive pulmonary disease with (acute) exacerbation: Secondary | ICD-10-CM | POA: Diagnosis not present

## 2014-04-16 DIAGNOSIS — J9620 Acute and chronic respiratory failure, unspecified whether with hypoxia or hypercapnia: Secondary | ICD-10-CM | POA: Diagnosis not present

## 2014-04-16 DIAGNOSIS — E876 Hypokalemia: Secondary | ICD-10-CM | POA: Diagnosis not present

## 2014-04-16 DIAGNOSIS — F17210 Nicotine dependence, cigarettes, uncomplicated: Secondary | ICD-10-CM | POA: Diagnosis not present

## 2014-04-16 DIAGNOSIS — I2510 Atherosclerotic heart disease of native coronary artery without angina pectoris: Secondary | ICD-10-CM | POA: Diagnosis not present

## 2014-04-16 DIAGNOSIS — J440 Chronic obstructive pulmonary disease with acute lower respiratory infection: Secondary | ICD-10-CM | POA: Diagnosis not present

## 2014-04-16 DIAGNOSIS — F329 Major depressive disorder, single episode, unspecified: Secondary | ICD-10-CM | POA: Diagnosis not present

## 2014-04-16 DIAGNOSIS — D649 Anemia, unspecified: Secondary | ICD-10-CM | POA: Diagnosis not present

## 2014-04-16 DIAGNOSIS — F418 Other specified anxiety disorders: Secondary | ICD-10-CM | POA: Diagnosis not present

## 2014-04-17 DIAGNOSIS — F17210 Nicotine dependence, cigarettes, uncomplicated: Secondary | ICD-10-CM | POA: Diagnosis not present

## 2014-04-17 DIAGNOSIS — J441 Chronic obstructive pulmonary disease with (acute) exacerbation: Secondary | ICD-10-CM | POA: Diagnosis not present

## 2014-04-17 DIAGNOSIS — J209 Acute bronchitis, unspecified: Secondary | ICD-10-CM | POA: Diagnosis not present

## 2014-04-17 DIAGNOSIS — J9620 Acute and chronic respiratory failure, unspecified whether with hypoxia or hypercapnia: Secondary | ICD-10-CM | POA: Diagnosis not present

## 2014-04-17 DIAGNOSIS — I2510 Atherosclerotic heart disease of native coronary artery without angina pectoris: Secondary | ICD-10-CM | POA: Diagnosis not present

## 2014-04-17 DIAGNOSIS — E876 Hypokalemia: Secondary | ICD-10-CM | POA: Diagnosis not present

## 2014-04-17 DIAGNOSIS — J440 Chronic obstructive pulmonary disease with acute lower respiratory infection: Secondary | ICD-10-CM | POA: Diagnosis not present

## 2014-04-17 DIAGNOSIS — F329 Major depressive disorder, single episode, unspecified: Secondary | ICD-10-CM | POA: Diagnosis not present

## 2014-04-17 DIAGNOSIS — Z9981 Dependence on supplemental oxygen: Secondary | ICD-10-CM | POA: Diagnosis not present

## 2014-04-17 DIAGNOSIS — D649 Anemia, unspecified: Secondary | ICD-10-CM | POA: Diagnosis not present

## 2014-04-17 DIAGNOSIS — G4733 Obstructive sleep apnea (adult) (pediatric): Secondary | ICD-10-CM | POA: Diagnosis not present

## 2014-04-17 DIAGNOSIS — F418 Other specified anxiety disorders: Secondary | ICD-10-CM | POA: Diagnosis not present

## 2014-04-20 DIAGNOSIS — G4733 Obstructive sleep apnea (adult) (pediatric): Secondary | ICD-10-CM | POA: Diagnosis not present

## 2014-04-20 DIAGNOSIS — F329 Major depressive disorder, single episode, unspecified: Secondary | ICD-10-CM | POA: Diagnosis not present

## 2014-04-20 DIAGNOSIS — F418 Other specified anxiety disorders: Secondary | ICD-10-CM | POA: Diagnosis not present

## 2014-04-20 DIAGNOSIS — J9620 Acute and chronic respiratory failure, unspecified whether with hypoxia or hypercapnia: Secondary | ICD-10-CM | POA: Diagnosis not present

## 2014-04-20 DIAGNOSIS — D649 Anemia, unspecified: Secondary | ICD-10-CM | POA: Diagnosis not present

## 2014-04-20 DIAGNOSIS — I2510 Atherosclerotic heart disease of native coronary artery without angina pectoris: Secondary | ICD-10-CM | POA: Diagnosis not present

## 2014-04-20 DIAGNOSIS — J440 Chronic obstructive pulmonary disease with acute lower respiratory infection: Secondary | ICD-10-CM | POA: Diagnosis not present

## 2014-04-20 DIAGNOSIS — F17210 Nicotine dependence, cigarettes, uncomplicated: Secondary | ICD-10-CM | POA: Diagnosis not present

## 2014-04-20 DIAGNOSIS — J441 Chronic obstructive pulmonary disease with (acute) exacerbation: Secondary | ICD-10-CM | POA: Diagnosis not present

## 2014-04-20 DIAGNOSIS — J209 Acute bronchitis, unspecified: Secondary | ICD-10-CM | POA: Diagnosis not present

## 2014-04-20 DIAGNOSIS — Z9981 Dependence on supplemental oxygen: Secondary | ICD-10-CM | POA: Diagnosis not present

## 2014-04-23 DIAGNOSIS — J209 Acute bronchitis, unspecified: Secondary | ICD-10-CM | POA: Diagnosis not present

## 2014-04-23 DIAGNOSIS — F418 Other specified anxiety disorders: Secondary | ICD-10-CM | POA: Diagnosis not present

## 2014-04-23 DIAGNOSIS — G4733 Obstructive sleep apnea (adult) (pediatric): Secondary | ICD-10-CM | POA: Diagnosis not present

## 2014-04-23 DIAGNOSIS — D649 Anemia, unspecified: Secondary | ICD-10-CM | POA: Diagnosis not present

## 2014-04-23 DIAGNOSIS — Z9981 Dependence on supplemental oxygen: Secondary | ICD-10-CM | POA: Diagnosis not present

## 2014-04-23 DIAGNOSIS — J441 Chronic obstructive pulmonary disease with (acute) exacerbation: Secondary | ICD-10-CM | POA: Diagnosis not present

## 2014-04-23 DIAGNOSIS — I2510 Atherosclerotic heart disease of native coronary artery without angina pectoris: Secondary | ICD-10-CM | POA: Diagnosis not present

## 2014-04-23 DIAGNOSIS — F329 Major depressive disorder, single episode, unspecified: Secondary | ICD-10-CM | POA: Diagnosis not present

## 2014-04-23 DIAGNOSIS — F17210 Nicotine dependence, cigarettes, uncomplicated: Secondary | ICD-10-CM | POA: Diagnosis not present

## 2014-04-23 DIAGNOSIS — J440 Chronic obstructive pulmonary disease with acute lower respiratory infection: Secondary | ICD-10-CM | POA: Diagnosis not present

## 2014-04-23 DIAGNOSIS — J9620 Acute and chronic respiratory failure, unspecified whether with hypoxia or hypercapnia: Secondary | ICD-10-CM | POA: Diagnosis not present

## 2014-04-26 DIAGNOSIS — I2510 Atherosclerotic heart disease of native coronary artery without angina pectoris: Secondary | ICD-10-CM | POA: Diagnosis not present

## 2014-04-26 DIAGNOSIS — Z9981 Dependence on supplemental oxygen: Secondary | ICD-10-CM | POA: Diagnosis not present

## 2014-04-26 DIAGNOSIS — F329 Major depressive disorder, single episode, unspecified: Secondary | ICD-10-CM | POA: Diagnosis not present

## 2014-04-26 DIAGNOSIS — J209 Acute bronchitis, unspecified: Secondary | ICD-10-CM | POA: Diagnosis not present

## 2014-04-26 DIAGNOSIS — G4733 Obstructive sleep apnea (adult) (pediatric): Secondary | ICD-10-CM | POA: Diagnosis not present

## 2014-04-26 DIAGNOSIS — J440 Chronic obstructive pulmonary disease with acute lower respiratory infection: Secondary | ICD-10-CM | POA: Diagnosis not present

## 2014-04-26 DIAGNOSIS — J441 Chronic obstructive pulmonary disease with (acute) exacerbation: Secondary | ICD-10-CM | POA: Diagnosis not present

## 2014-04-26 DIAGNOSIS — D649 Anemia, unspecified: Secondary | ICD-10-CM | POA: Diagnosis not present

## 2014-04-26 DIAGNOSIS — J9620 Acute and chronic respiratory failure, unspecified whether with hypoxia or hypercapnia: Secondary | ICD-10-CM | POA: Diagnosis not present

## 2014-04-26 DIAGNOSIS — F17210 Nicotine dependence, cigarettes, uncomplicated: Secondary | ICD-10-CM | POA: Diagnosis not present

## 2014-04-26 DIAGNOSIS — F418 Other specified anxiety disorders: Secondary | ICD-10-CM | POA: Diagnosis not present

## 2014-04-30 DIAGNOSIS — J209 Acute bronchitis, unspecified: Secondary | ICD-10-CM | POA: Diagnosis not present

## 2014-04-30 DIAGNOSIS — J9690 Respiratory failure, unspecified, unspecified whether with hypoxia or hypercapnia: Secondary | ICD-10-CM | POA: Diagnosis not present

## 2014-04-30 DIAGNOSIS — R0902 Hypoxemia: Secondary | ICD-10-CM | POA: Diagnosis not present

## 2014-05-01 DIAGNOSIS — I2510 Atherosclerotic heart disease of native coronary artery without angina pectoris: Secondary | ICD-10-CM | POA: Diagnosis not present

## 2014-05-01 DIAGNOSIS — F418 Other specified anxiety disorders: Secondary | ICD-10-CM | POA: Diagnosis not present

## 2014-05-01 DIAGNOSIS — J441 Chronic obstructive pulmonary disease with (acute) exacerbation: Secondary | ICD-10-CM | POA: Diagnosis not present

## 2014-05-01 DIAGNOSIS — G4733 Obstructive sleep apnea (adult) (pediatric): Secondary | ICD-10-CM | POA: Diagnosis not present

## 2014-05-01 DIAGNOSIS — F329 Major depressive disorder, single episode, unspecified: Secondary | ICD-10-CM | POA: Diagnosis not present

## 2014-05-01 DIAGNOSIS — D649 Anemia, unspecified: Secondary | ICD-10-CM | POA: Diagnosis not present

## 2014-05-01 DIAGNOSIS — E876 Hypokalemia: Secondary | ICD-10-CM | POA: Diagnosis not present

## 2014-05-01 DIAGNOSIS — Z9981 Dependence on supplemental oxygen: Secondary | ICD-10-CM | POA: Diagnosis not present

## 2014-05-01 DIAGNOSIS — J440 Chronic obstructive pulmonary disease with acute lower respiratory infection: Secondary | ICD-10-CM | POA: Diagnosis not present

## 2014-05-01 DIAGNOSIS — J209 Acute bronchitis, unspecified: Secondary | ICD-10-CM | POA: Diagnosis not present

## 2014-05-01 DIAGNOSIS — F17210 Nicotine dependence, cigarettes, uncomplicated: Secondary | ICD-10-CM | POA: Diagnosis not present

## 2014-05-01 DIAGNOSIS — J9620 Acute and chronic respiratory failure, unspecified whether with hypoxia or hypercapnia: Secondary | ICD-10-CM | POA: Diagnosis not present

## 2014-05-03 DIAGNOSIS — J9620 Acute and chronic respiratory failure, unspecified whether with hypoxia or hypercapnia: Secondary | ICD-10-CM | POA: Diagnosis not present

## 2014-05-03 DIAGNOSIS — J441 Chronic obstructive pulmonary disease with (acute) exacerbation: Secondary | ICD-10-CM | POA: Diagnosis not present

## 2014-05-03 DIAGNOSIS — G4733 Obstructive sleep apnea (adult) (pediatric): Secondary | ICD-10-CM | POA: Diagnosis not present

## 2014-05-03 DIAGNOSIS — J440 Chronic obstructive pulmonary disease with acute lower respiratory infection: Secondary | ICD-10-CM | POA: Diagnosis not present

## 2014-05-04 NOTE — H&P (Signed)
PATIENT NAME:  Samantha Goodman, ALAS MR#:  [id number] DATE OF BIRTH:  December 23, 1935  DATE OF ADMISSION:  11/01/2012  PRIMARY CARE PHYSICIAN: Lorie Phenix, MD  PULMONOLOGIST: Freda Munro, MD   CHIEF COMPLAINT: Shortness of breath and wheezing.   HISTORY OF PRESENT ILLNESS: Ms. Samantha Goodman is a pleasant 79 year old Caucasian female well-known to our service presents with similar complaints of increasing shortness of breath, took multiple breathing treatments and her saturations were around 85% while EMS arrived at home. She is having significant wheezing with clear productive phlegm, which is more than normal. She denies any fever, any sick contacts or any allergy symptoms. In the Emergency Room, the patient was placed initially on BiPAP, now on nasal cannula, 3 liters, with sats 94 to 96% on 3 liters nasal cannula. She received 125 mg of Solu-Medrol, 1 dose of IV Levaquin, 750 mg, several nebulizer treatment and now is being admitted for further evaluation and management. Her white count is elevated to 18,000. She was initially tachycardic with pulse rate around 92.  PAST MEDICAL HISTORY: 1.  COPD, on home oxygen, 2 liters.  2.  Hypothyroidism.  3.  Ongoing tobacco abuse.  4.  Osteoporosis.  5.  Hyperlipidemia.   PAST SURGICAL HISTORY: 1.  Cataract surgery.  2.  Left ankle surgery after fall. 3.  Appendectomy.  4.  Hysterectomy.   ALLERGIES: CODEINE and SPIRIVA. SPIRIVA causes rash.   SOCIAL HISTORY: She lives at home, uses 2 liters of oxygen 24 hours. Smokes 2 to 3 cigarettes a day. Walks on her own without a walker. She has a portable tank that she uses to go out.  FAMILY HISTORY: Dad was an alcoholic. Mother had hypertension and CVA.  MEDICATIONS: 1.  Theophylline 2400 mg 2 capsules p.o. daily.  2.  Symbicort 80/4.5 two puffs b.i.d.  3.  ProAir HFA 2 puffs every 4 to 6 hours. 4.  DuoNeb nebulizer.  5.  Pravastatin 40 mg daily at bedtime.  6.  Singulair 10 mg daily.  7.  Levothyroxine 50 mcg  daily.   REVIEW OF SYSTEMS:   CONSTITUTIONAL: No fever, fatigue, weakness.  EYES: No blurred or double vision.  ENT: No tinnitus, ear pain, nasal discharge or sinus drainage.  RESPIRATORY: Positive for cough, wheeze, COPD and chronic respiratory failure.  CARDIOVASCULAR: No chest pain, orthopnea, edema or hypertension.  GASTROINTESTINAL: No nausea, vomiting, diarrhea, abdominal pain or melena.  GENITOURINARY: No dysuria, hematuria or frequency.  ENDOCRINE: No polyuria or nocturia. Does have hypothyroidism.  HEMATOLOGY: Positive for easy bruising with ecchymosis. No bleeding disorder or anemia.  SKIN: No acne, rash or any other lesions.  MUSCULOSKELETAL: Positive for arthritis.  NEUROLOGIC: No numbness, weakness or seizures.  PSYCHIATRIC: No anxiety or depression. All other systems reviewed are negative.   PHYSICAL EXAMINATION: GENERAL: The patient is awake, alert and oriented x 3, not in acute distress.  VITAL SIGNS: Afebrile. Pulse is 85 and regular. Blood pressure is 104/61. Sats are 95% on 3 liters nasal cannula.  HEENT: Atraumatic, normocephalic. Pupils are equal, round and reactive to light and accommodation. EOMI intact. Oral mucosa is moist.  NECK: Supple. No JVD. No carotid bruit.  LUNGS: The patient has bilateral wheezing. Decreased breath sounds at the bases. No crackles heard. No use of accessory muscles. The patient does have some mild respiratory distress.  HEART: Both the heart sounds are normal. Rate, rhythm regular. PMI not lateralized. Chest is nontender.  EXTREMITIES: Good pedal pulses. Good femoral pulses. No lower extremity edema.  ABDOMEN:  Soft, benign, nontender. No organomegaly. Positive bowel sounds.  NEUROLOGIC: Grossly intact cranial nerves II through XII. No motor or sensory deficits.  PSYCHIATRIC: The patient is awake, alert oriented x 3.   LABORATORY AND DIAGNOSTICS: UA negative for UTI.  PH is 7.44, pCO2 is 45 and pO2 77; this is on BiPAP.   Calcium 8.9.  Basic metabolic panel within normal limits, except SGOT of 39. H and H are 13.5 and 41. White count is 18,000. Magnesium is 1.7. Cardiac enzymes negative.   EKG showed sinus rhythm.   ASSESSMENT: 79 year old Ms. Samantha Goodman with history of chronic obstructive pulmonary disease comes in with:  1.  Acute on chronic hypoxic respiratory failure secondary to chronic obstructive pulmonary disease exacerbation. The patient is saturating 85% on 2 liters and significantly short of breath and wheezing. She was kept on BiPAP in the Emergency Room, now converted to 3 liters nasal cannula oxygen.  She received IV Solu-Medrol. Will admit on medical floor and continue Solu-Medrol around the clock along with nebulizers and empiric antibiotic with Levaquin. We will also continue DuoNebs and her oral inhalers.  2.  Acute bronchitis. The patient is afebrile. Does have leukocytosis however does not have any fever. The patient will be given Levaquin empirically.  3.  Tobacco abuse. The patient has been counseled for more than 3 minutes. The patient mentions that she has quit in the past but relapsed. She says the nicotine patches at home are working. About 3 minutes spent on counseling.  4.  Hypothyroidism. Continue levothyroxine.  5.  Deep vein thrombosis prophylaxis with Lovenox.   The above was discussed with the patient who is agreeable to it. There are no family members present in the Emergency Room.   TIME SPENT: 50 minutes. ____________________________ Wylie HailSona A. Allena KatzPatel, MD sap:sb D: 11/01/2012 07:57:57 ET T: 11/01/2012 08:32:30 ET JOB#: [id number]  cc: Chayim Bialas A. Allena KatzPatel, MD, <Dictator> Willow OraSONA A Babs Dabbs MD ELECTRONICALLY SIGNED 11/06/2012 10:53

## 2014-05-04 NOTE — H&P (Signed)
PATIENT NAME:  Samantha Goodman, Samantha Goodman MR#:  [id number] DATE OF BIRTH:  01-14-1935  DATE OF ADMISSION:  02/27/2012  PRIMARY CARE PHYSICIAN: [name], MD   CHIEF COMPLAINT: Shortness of breath and wheezing.   HISTORY OF PRESENT ILLNESS: This is a 79 year old patient with history of COPD, chronic respiratory failure on 2 liters oxygen at home and hypothyroidism, presents to the Emergency Room complaining of worsening shortness of breath of a day along with significant wheezing and clear productive sputum, which is more than normal. The patient does not have any chest pain. In the Emergency Room, the patient was tried on multiple breathing treatments and still her saturations are between 86% to 87% on 3 liters oxygen, significant shortness of breath and wheezing, and is being admitted to the hospitalist service with acute on chronic respiratory failure secondary to COPD exacerbation. The patient's chest x-ray shows possible left lower lobe infiltrate with small pleural effusion on the left side.   The patient was last admitted to the hospital in August 2013. She continues to smoke a pack a day. Has followup with her primary care physician and Dr. Welton FlakesKhan in 2 weeks as outpatient.   PAST MEDICAL HISTORY:  1.  COPD.  2.  Chronic respiratory failure on 2 liters oxygen.  3.  Hypothyroidism.  4.  Tobacco abuse.  5.  Osteoporosis.  6.  Hyperlipidemia.   PAST SURGICAL HISTORY:  1.  Cataract surgery. 2.  Left ankle surgery after a fall.  3.  Appendectomy.  4.  Hysterectomy.   ALLERGIES: CODEINE AND SPIRIVA. SPIRIVA CAUSES A RASH.   HOME MEDICATIONS: Include: 1.  Pravachol 40 mg oral daily.  2.  Synthroid 75 mcg oral daily.  3.  ProAir inhaler as needed every 4 hours.  4.  Symbicort 84.5, 2 puffs inhaled twice a day.  5.  Loratadine 10 mg oral once a day.  6.  Prednisone 10 mg oral once a day.  7.  Fish oil 1 capsule oral once a day.  8.  Ibuprofen 200 mg oral 3 times a day as needed.  9.  Advair  500/50 inhaled twice a day.   SOCIAL HISTORY: The patient lives at home alone. Uses 2 liters oxygen per 24 hours. Smokes a pack a day. Walks on her own without a walker or cane. Has a portable tank that she uses to go out.   CODE STATUS: DNR/DNI as expressed by the patient with daughter at bedside.   FAMILY HISTORY: Dad was an alcoholic. Mom had hypertension and CVA.   REVIEW OF SYSTEMS:    CONSTITUTIONAL: Has fatigue, weakness. No fever. Had some chills.  EYES: No blurred vision, redness or discharge.  ENT: No tinnitus, ear pain, hearing loss.  RESPIRATORY: Has cough, wheeze, clear productive sputum, COPD and chronic respiratory failure.  CARDIOVASCULAR: No chest pain, orthopnea, edema.  GASTROINTESTINAL: No nausea, vomiting, abdominal pain, diarrhea.  GENITOURINARY: No dysuria, hematuria, frequency.  ENDOCRINE: No polyuria or nocturia. Does have hypothyroidism.  HEMATOLOGIC: No easy bruising, bleeding or anemia.  SKIN: No acne, rash, lesions.  MUSCULOSKELETAL: Has arthritis.  NEUROLOGIC: No numbness, weakness, seizures.  PSYCHIATRIC: No anxiety or depression.   PHYSICAL EXAMINATION:  VITAL SIGNS: Temperature 98, pulse 103, respirations 32, blood pressure 119/66, saturating 85% on 3 liters oxygen.  GENERAL: Obese Caucasian female patient lying in bed in significant respiratory distress, using accessory muscles and conversational dyspnea.  PSYCHIATRIC: Alert, oriented x 3. Mood and affect appropriate. Judgment intact.  HEENT: Atraumatic, normocephalic. Oral  mucosa moist and pink. External ears and nose normal. No pallor. No icterus. Pupils bilaterally equal and reactive to light.  NECK: Supple. No thyromegaly. No palpable lymph nodes. Trachea midline. No carotid bruits or JVD.  CARDIOVASCULAR: S1, S2 tachycardic without any murmurs. Peripheral pulses 2+. No edema.  RESPIRATORY: Increased work of breathing. Bilateral wheezing and coarse crackles all over. Normal percussion. Decreased air  entry at the left base.  GASTROINTESTINAL: Soft abdomen, nontender. Bowel sounds present. No hepatosplenomegaly palpable.  SKIN: Warm and dry. No petechiae, rash, ulcers.  MUSCULOSKELETAL: No joint swelling, redness, effusion of the large joints. Normal muscle tone.  NEUROLOGICAL: Motor strength 5/5 in upper and lower extremities. Sensation to fine touch intact all over.  LYMPHATIC: No cervical lymphadenopathy.   LABORATORY, DIAGNOSTIC AND RADIOLOGICAL DATA: Lab studies show glucose of 86, BNP of 484, BUN 15, creatinine 0.88, sodium 139, potassium 3.6. AST, ALT, alkaline phosphatase, bilirubin normal. CK of 57, troponin less than 0.02. WBC 16.4, hemoglobin 14.6, platelets of 329.   EKG shows normal sinus rhythm with nonspecific ST-T wave changes, occasional PVCs.   Chest x-ray shows chronic changes, COPD with left lower lobe atelectasis versus infiltrate and possible left lower lobe small pleural effusion. No cardiomegaly.   ASSESSMENT AND PLAN:  1.  Acute respiratory failure over chronic respiratory failure: The patient is saturating 85% on 3 liters oxygen at this time and is significantly short of breath and wheezing. This is secondary to acute chronic obstructive pulmonary disease exacerbation. Cannot rule out a left lower lobe infiltrate at this time, but could be atelectasis. Will start the patient on IV steroids, oxygen to keep saturations 88% to 90%. The patient might need BiPAP in case her saturations do not improve with further breathing treatments and IV steroid dose. The patient will need inpatient admission for acute respiratory failure.  2.  Acute chronic obstructive pulmonary disease exacerbation: See above.  3.  Left lower lobe infiltrate: This could be atelectasis. The patient is afebrile. Does have leukocytosis, which could be secondary to the prednisone. Will start her on Levaquin for possible community-acquired pneumonia. Repeat a chest x-ray in the morning.  4.  Elevated BNP:  This could be secondary to long-standing chronic obstructive pulmonary disease, possible pulmonary hypertension. Will get an echocardiogram to look for any congestive heart failure. The patient does not have any lower extremity edema or jugular venous distention at this time.  5.  Tobacco abuse: The patient has been counseled for greater than 3 minutes. The patient mentions that she quit in the past but relapsed. I have strongly urged her to quit smoking. She has not asked for any nicotine patch and thinks that she can quit on her own.  6.  Hypothyroidism: Continue levothyroxine.  7.  Deep vein thrombosis prophylaxis with Lovenox.   CODE STATUS: DNR/DNI as mentioned by the patient with daughter at bedside.   TIME SPENT: Time spent today on this case was 40 minutes with more than 50% of time spent in coordination of care.  ____________________________ Molinda Bailiff. Railee Bonillas, MD srs:jm D: 02/27/2012 15:47:08 ET T: 02/27/2012 16:48:50 ET JOB#: [id number]  cc: Wardell Heath R. Elpidio Anis, MD, <Dictator> Leo Grosser, MD Yevonne Pax, MD Orie Fisherman MD ELECTRONICALLY SIGNED 02/27/2012 18:06

## 2014-05-04 NOTE — Discharge Summary (Signed)
PATIENT NAME:  Samantha Goodman, Samantha Goodman MR#:  [id number] DATE OF BIRTH:  09-02-1935  DATE OF ADMISSION:  02/27/2012 DATE OF DISCHARGE:  03/01/2012  For a detailed note, please see the history and physical done on admission by Dr. Elpidio Anis.   DIAGNOSES AT DISCHARGE:   1.  Chronic obstructive pulmonary disease exacerbation likely secondary to bronchitis/pneumonia.  2.  Acute and chronic respiratory failure secondary to chronic obstructive pulmonary disease exacerbation.  3.  History of hyperlipidemia.  4.  History of hypothyroidism.   DIET:  The patient was discharged on a low-fat diet.   ACTIVITY:  As tolerated.   FOLLOWUP:  With Dr. Lorie Phenix in the next 1 to 2 weeks.   DISCHARGE MEDICATIONS:  Loratadine 10 mg daily, Pravachol 40 mg daily, Synthroid 75 mcg daily, vitamin D3 at 5000 International Units daily, vitamin C 5 mg daily, fish oil daily, ibuprofen 200 mg 4 times daily as needed, albuterol inhaler 2 puffs q. 4 to 6 hours as needed, Symbicort 2 puffs b.i.d., theophylline 200 mg daily, prednisone taper starting at 60 mg down to 10 mg over the next 12 days, Levaquin 5 mg daily x 7 days, albuterol ipratropium nebulizers q.i.d. as needed.   PERTINENT STUDIES DONE DURING THE HOSPITAL COURSE:  A chest x-ray done on February 28, 2012 showing an area of increased density in the left lung base, a component of it actually presents as a pleural effusion, atelectasis or focal infiltrate cannot be excluded, interstitial infiltrate. Findings concerning for a rib fracture involving the lateral aspect of the tenth left rib.   HOSPITAL COURSE:  This is a 79 year old female with medical problems as mentioned above who presented to the hospital with worsening shortness of breath, wheezing and chest x-ray findings suggestive of pneumonia with a possible pleural effusion.  1.  Acute on chronic respiratory failure. This was likely secondary to COPD exacerbation. The patient has a long history of tobacco abuse and  still continues to smoke. She was admitted to the hospital and started on IV steroids, DuoNebs around the clock and maintained on her Symbicort. I attempted to start her on Spiriva, but she has been intolerant to it in the past. I resumed her theophylline. After getting some aggressive therapy in the hospital, her clinical symptoms have improved. She is already oxygen at home which she will continue and she was discharged on a prednisone taper and empiric antibiotics as stated.  2.  COPD exacerbation. This was likely secondary to acute bronchitis along with ongoing tobacco abuse. The patient was treated with IV steroids and continued on her Symbicort and DuoNebs around the clock. The patient's sputum cultures and blood cultures remain negative. She has been afebrile. Her white cell count has actually improved. She clinically is feeling better and is being discharged on a prednisone taper along with empiric antibiotics and continuation of her Advair. As mentioned, she cannot tolerate Spiriva. She will continue with theophylline. She will continue to followup with her pulmonologist, Dr. Welton Flakes, as an outpatient.  3.  Hypothyroidism. The patient was maintained on her Synthroid and she will resume that.  4.  Hyperlipidemia. The patient was maintained on Pravachol and she will resume that.  5.  Leukocytosis. This is possibly secondary to the steroids that she was receiving. It actually did improve through hospital course and there was no acute infectious source of leukocytosis. This can be further followed up as an outpatient.   CODE STATUS:  The patient is a full code.  TIME SPENT:  40 minutes.    ____________________________ Rolly PancakeVivek J. Cherlynn KaiserSainani, MD vjs:si D: 03/01/2012 16:32:49 ET T: 03/01/2012 23:30:34 ET JOB#: [id number]  cc: Rolly PancakeVivek J. Cherlynn KaiserSainani, MD, <Dictator> [name], MD Houston SirenVIVEK J Savvas Roper MD ELECTRONICALLY SIGNED 03/11/2012 1:04

## 2014-05-04 NOTE — Discharge Summary (Signed)
PATIENT NAME:  Samantha Goodman, Samantha Goodman MR#:  [id number] DATE OF BIRTH:  [birth year]  DATE OF ADMISSION:  11/01/2012 DATE OF DISCHARGE:  11/02/2012  PRESENTING COMPLAINT: Shortness of breath and cough.   DISCHARGE DIAGNOSES:  1. Acute on chronic hypoxic respiratory failure secondary to acute on chronic obstructive pulmonary disease exacerbation.  2. Chronic respiratory failure, on chronic home oxygen 2 liters nasal cannula.  3. Right lower lobe atelectasis versus pneumonia.  4. Hypothyroidism.   OXYGEN SATURATION: More than 92% on 2 liters nasal cannula.   DISCHARGE MEDICATIONS: 1. Pravastatin 40 mg p.o. at bedtime.  2. ProAir HFA 2 puffs every 4 to 6 hours as needed.  3. Theophylline-24 at 200 mg daily.  4. Levothyroxine 50 mcg daily.  5. Singulair 10 mg daily.  6. Symbicort 80/4.5 two puffs b.i.d.  7. Prednisone taper.  8. Levaquin 500 p.o. daily.   OXYGEN: 2 liters nasal cannula continuous.   FOLLOWUP:  1. Follow up with Dr. Elease HashimotoMaloney in 1 to 2 weeks.  2. Follow up with Dr. Freda MunroSaadat Khan in 1 to 2 weeks.   LABORATORY DATA: White count is 13.5. UA negative for UTI. Blood cultures negative in 48 hours. EKG: Normal sinus rhythm. Calcium is 8.9. The rest of the electrolytes within normal limits, except SGOT of 39. White count on admission was 18.1. Troponin is less than 0.02.   HISTORY AND HOSPITAL COURSE:  [name] is a very pleasant 79 year old female with history of ongoing tobacco abuse and COPD, on home oxygen, hyperlipidemia and hypothyroidism, who comes to the Emergency Room with shortness of breath, cough. She was admitted with:   1. Acute on chronic hypoxic respiratory failure secondary to chronic obstructive pulmonary disease exacerbation likely due to ongoing tobacco abuse and suspected underlying right lower lobe atelectasis versus pneumonia. Her white count was 18.5. She was started on IV Solu-Medrol and Levaquin, which was switched to p.o. prednisone taper and complete the course of  Levaquin as outpatient. Her nebulizers, inhalers and theophylline were continued. Echo was done in the past, which showed normal LV function.  2. Hypothyroidism. Continue Synthroid.  3. Hyperlipidemia. Continue Pravachol.  4. The patient was feeling back to baseline and requested to go home. Was discharged after physical therapy saw the patient and did not meet any outpatient physical therapy requirement. Hospital stay otherwise remained stable.   CODE STATUS: The patient remained a full code.   TIME SPENT: 40 minutes.    ____________________________ Wylie HailSona A. Allena KatzPatel, MD sap:lb D: 11/03/2012 07:30:19 ET T: 11/03/2012 08:26:59 ET JOB#: [id number]  cc: [name], MD, <Dictator> [name], MD [name], MD [name] MD ELECTRONICALLY SIGNED 11/06/2012 10:55

## 2014-05-04 NOTE — H&P (Signed)
PATIENT NAME:  Samantha Goodman, Samantha Goodman MR#:  [id number] DATE OF BIRTH:  Jul 14, 1935  DATE OF ADMISSION:  12/08/2012  PRIMARY CARE PHYSICIAN: Dr. ----------  REFERRING MD: Dr. Dolores FrameSung.  CHIEF COMPLAINT: Shortness of breath and wheezing.   HISTORY OF PRESENT ILLNESS: The patient is 79 year old, pleasant, Caucasian female who is well-known to hospitalist service and was just admitted to the hospital in October 2014 with a similar complaint of shortness of breath and wheezing. She is presenting to the ER with a chief complaint of a 2-day history of shortness of breath and wheezing. The patient is on 2 Goodman of oxygen and by the time EMS arrived, the patient's pulse ox was around mid 80s. The patient was placed on BiPAP and she was brought into the ER and she has received nebulizer treatments. Chest x-ray did not reveal any pneumonia. Clinically, the patient sounds like she has pneumonia and she was started on IV antibiotics by the ER physician. The patient has received  Duo-Neb treatments. Hospitalist team is called to admit the patient. During my examination, the patient is still on BiPAP with 40% FiO2. She is reporting that her shortness of breath is significantly improved after she was placed on BiPAP machine and nebulizer treatments were given. No family members are at bedside. The patient has reported that she lives alone and her daughter does not know that she is in the hospital, but she does not want us to notify to the daughter until she is better. She also has reported that because she is feeling much better, she might consider talking to her daughter regarding her hospital stay in the morning. Otherwise, no complaints. Denies any fever. No sick contacts at home.   PAST MEDICAL HISTORY: Chronic history of chronic obstructive pulmonary disease, lives on 2 Goodman of oxygen, hypothyroidism, ongoing tobacco abuse, hyperlipidemia and osteoporosis.   PAST SURGICAL HISTORY: Appendectomy, hysterectomy, left ankle surgery after  fall and cataract surgery.   ALLERGIES: SHE IS ALLERGIC TO CODEINE AND SPIRIVA.   PSYCHOSOCIAL HISTORY: Lives at home by herself. Lives on 2 Goodman of oxygen and still continues to smoke 3 cigarettes per day. She uses a portable tank of oxygen.   FAMILY HISTORY: Mother had hypertension and stroke.   HOME MEDICATIONS: Theophylline 2 capsules p.o. once a day, Symbicort 2 puffs inhalation 2 times a day, Pro-Air 2 puffs inhalation every 4 to 6 hours, Prednisone 10 mg tapering dose, montelukast 10 mg once daily and levothyroxine 50 mcg p.o. once daily.   REVIEW OF SYSTEMS: CONSTITUTIONAL: Denies any fever or fatigue. EYES: Denies blurry vision, double vision. EARS, NOSE, THROAT: No epistaxis, discharge. RESPIRATORY: Denies cough, but complaining of wheezing and has a chronic history of chronic obstructive pulmonary disease. Lives on 2 Goodman of oxygen. CARDIOLOGY: Denies any chest pain, orthopnea, palpitations. GASTROINTESTINAL: Denies nausea, vomiting, diarrhea.  GENITOURINARY: No dysuria or hematuria. ENDOCRINE: Denies any polyuria or nocturia. She does have a hypothyroidism. HEMATOLOGIC: No anemia, easy bruising or bleeding.  INTEGUMENTARY: No acne, rash, lesions. MUSCULOSKELETAL: No joint pain, but has for arthritis, which is chronic in nature. NEUROLOGIC: No vertigo, ataxia. Denies any numbness.  PSYCHIATRIC: No ADD or OCD.   PHYSICAL EXAMINATION:  VITAL SIGNS: Temperature 97.8, pulse 79, respirations 28, blood pressure 104/50, pulse oximetry 96% on 40% FiO2 on BiPAP machine.  GENERAL APPEARANCE: Not in any acute distress. Moderately built and nourished.  HEENT: Normocephalic and atraumatic. Pupils are equally reacting to light and accommodation. No scleral icterus. No conjunctival injection. No sinus  tenderness. No congestion. Extraocular movements are intact. Moist mucous membranes.  NECK: Supple. No JVD. No thyromegaly.  LUNGS: Coarse bronchial breath sounds with bilateral wheezing and positive  crackles.  CARDIAC: S1 and S2 normal. Regular rate and rhythm. Point of maximum impulse is intact. No anterior chest wall tenderness on palpation. No peripheral edema.  EXTREMITIES: No edema. No cyanosis. No clubbing. Good peripheral pulses.  GASTROINTESTINAL: Soft. Bowel sounds are positive in all 4 quadrants. Nontender, nondistended. No hepatosplenomegaly. No masses felt.  NEUROLOGIC: Alert and oriented x 3. Motor and sensory grossly intact. Cranial nerves II through XII are intact. Reflexes are 2+.  SKIN: Warm to touch. Normal turgor. No rashes. No lesions.  MUSCULOSKELETAL: No joint effusion, tenderness, erythema.  PSYCHIATRIC: Normal mood and affect.   LABS AND IMAGING STUDIES: LFTs are normal except albumin which is low at 3.3. CK total 72, CPK-MB 1.2, troponin less than 0.02. WBC 24.3, hemoglobin 13.4, hematocrit 40.7. Ph 7.49, pCO2 38, pO2 of 59, FiO2 of 40% on BiPAP, bicarbonate is 29.0. Glucose 117, BUN 22, creatinine 0.86, sodium 142, potassium 3.3, chloride is 104, CO2 32, GFR greater than 60. Anion gap 6, serum osmolality 289, calcium 8.8. A 12-lead EKG revealed sinus rhythm at 85 beats per minute, short PR interval, nonspecific ST-T wave changes and normal QT. Chest x-ray, portable, has revealed COPD, no acute findings.   ASSESSMENT AND PLAN: A 79 year old Caucasian female presenting to the ER with a chief complaint of a 2-day history of worsening of shortness of breath and cough. She will be admitted with the following assessment and plan:  1.  Acute exacerbation of chronic obstructive pulmonary disease, on BiPAP. We will continue intravenous Solu-Medrol and nebulizer treatment. The patient will benefit from tobacco cessation.  2.  Probably clinical health care associated pneumonia. The patient will be on broad-spectrum antibiotics of Zosyn and Levaquin. One dose of intravenous vancomycin was given in the ER. Pharmacy to dose further.  3.  Hypothyroidism. Continue Synthroid.  4.   Hyperlipidemia. Continue her home medication.  5.  Insulin-dependent. The patient will benefit from tobacco cessation counseling. Counseling is to be provided by the rounding physician in the a.m. once the patient is more stable. Will provide her GI and DVT prophylaxis.   CODE STATUS: She is full code.   The diagnosis and plan of care was discussed in detail with the patient. She is aware of the plan. She does not want to discuss with her daughter at this time. Her family members need to be notified in a.m. if the patient is agreeable to that.   TOTAL TIME SPENT ON ADMISSION: 45 minutes.    ____________________________ Ramonita Lab, MD ag:aw D: 12/08/2012 04:41:56 ET T: 12/08/2012 06:14:57 ET JOB#: [id number]  cc: Ramonita Lab, MD, <Dictator> Primary care physician Ramonita Lab MD ELECTRONICALLY SIGNED 12/14/2012 7:13

## 2014-05-04 NOTE — Discharge Summary (Signed)
PATIENT NAME:  Samantha Goodman, Samantha Goodman MR#:  [id number] DATE OF BIRTH:  05-28-1935  DATE OF ADMISSION:  12/08/2012 DATE OF DISCHARGE:  12/11/2012  DISCHARGE DIAGNOSES:  1.  Acute on chronic respiratory failure.  2.  Left lower lobe pneumonia.  3.  Chronic obstructive pulmonary disease exacerbation.  4.  Hypothyroidism.  5.  Hyperlipidemia.  6.  Leukocytosis secondary to steroids.   IMAGING STUDIES DONE: Include COPD and left lower lobe infiltrate.   ADMITTING HISTORY AND PHYSICAL AND HOSPITAL COURSE:  1.  Please see detailed H and P dictated by Dr. Amado CoeGouru. In brief, a 79 year old female with history of advanced COPD on home oxygen presented to the hospital complaining of shortness of breath. The patient was found to have left lower lobe pneumonia, started on broad-spectrum antibiotics, on BiPAP, and admitted to the hospitalist service.  2.  The patient was weaned off BiPAP, later transitioned to nasal cannula. By the day of discharge, the patient still has wheezing but she mentions that she always has wheezing and chronic shortness of breath and feels back to baseline. The patient's white count, which was initially high, is slowly trending down, but she has been on high-dose steroids which explains her high WBC count in spite of being afebrile and feeling back to baseline. The patient will be discharged home on nebulizers, antibiotics.   Prior to discharge, the patient still has wheezing on exam with some shortness of breath. Cardiac examination was normal.   DISCHARGE MEDICATIONS:  Include:  1.  Pravastatin 40 mg daily.  2.  ProAir HFA 2 puffs inhaled every 4 to 6 hours as needed for shortness of breath.  3.  Theophylline two capsules 200 mg oral once a day.  4.  Levothyroxine 50 mcg daily.  5.  Montelukast 10 mg oral once a day.  6.  Symbicort 84.5, two puffs inhaled 2 times a day.  7.  Prednisone 60 mg tapered over 6 days.  8.  Cefuroxime 500 mg oral 2 times a day.  9.  Guaifenesin 400 mg oral  3 times a day.  10.  DuoNeb nebulizer 4 times a day as needed for shortness of breath.   DISCHARGE INSTRUCTIONS: Two liters of oxygen, regular diet, regular activity.   FOLLOWUP: With Dr. Welton FlakesKhan of Pulmonology on 12/14/2012.   TIME SPENT ON DAY OF DISCHARGE IN DISCHARGE ACTIVITY: Was 33 minutes.   ____________________________ Samantha BailiffSrikar R. Dominique Ressel, MD srs:np D: 12/19/2012 14:00:00 ET T: 12/19/2012 15:40:39 ET JOB#: [id number]  cc: Wardell HeathSrikar R. Sonda Coppens, MD, <Dictator> Samantha FishermanSRIKAR R Baylynn Shifflett MD ELECTRONICALLY SIGNED 12/21/2012 14:07

## 2014-05-05 NOTE — Discharge Summary (Signed)
PATIENT NAME:  Samantha Goodman, Samantha Goodman MR#:  [id number] DATE OF BIRTH:  10/06/1935  DATE OF ADMISSION:  03/05/2013 DATE OF DISCHARGE:  03/08/2013   ADMITTING DIAGNOSES:  1.  Acute on chronic hypoxic respiratory failure due to chronic obstructive pulmonary disease exacerbation.  2.  Nicotine dependence.  3.  Hypothyroidism. 4.  Hypertension.   DISCHARGE DIAGNOSES: 1.  Acute on chronic respiratory failure due to chronic obstructive pulmonary disease exacerbation as well as acute diastolic congestive heart failure.  2.  Acute bronchitis.  3.  Hyperlipidemia.  4.  Hypothyroidism.  5.  Coronary artery disease, status post cardiac catheterization on the 24th of February 2015 by Dr. Gwen PoundsKowalski revealing normal left ventricular function with ejection fraction of 65%, significant left main and moderate left anterior descending, diagonal, left circumflex, and right coronary artery  stenosis with  normal pulmonary pressures.   DISCHARGE MEDICATIONS: The patient is to continue alprazolam 0.25 mg twice daily as needed, Symbicort 80/4.5 mcg 2 puffs twice daily, Celexa 20 mg p.o. daily, montelukast 10 mg p.o. daily, Protonix 40 mg p.o. daily, Pravachol 40 mg p.o. at bedtime, theophylline SR 24-hour release 100 mg p.o. daily, aspirin 81 mg p.o. daily, enoxaparin 40 mg subcutaneously daily (last given on the 24th of February 2015 at 8:00 p.m. The patient was on Lasix 20 mg p.o. twice daily (was suspended today on the 25th of February 2015), sliding scale insulin, nitroglycerin 1 inch twice daily as well as sublingual nitroglycerin as needed, levofloxacin 750 mg p.o. daily, prednisone taper. The patient is to start prednisone taper tomorrow with 50 mg p.o. once daily dose pill to be tapered by 10 mg every day until stopped. The patient is to continue DuoNeb 2.5 mg SVN every 4 hours as needed and every 6 hours around the clock.   CONSULTANTS: Dr. Gwen PoundsKowalski, Dr. Meredeth IdeFleming, care management, social work.   RADIOLOGIC STUDIES: Chest  x-ray portable single view on the 22nd of February 2015 revealed mild CHF with stable mild cardiomegaly and new mild interstitial pulmonary edema, stable bilateral pleural effusions. Cardiac catheterization on 24th of February 2015 by Dr. Gwen PoundsKowalski revealing coronary circulation right dominant. Ostial left main, there was 70% stenosis. Distal left main, there was 80% stenosis. Proximal LAD 40% stenosis. First diagonal 50% stenosis; in a second lesion, there was 70% stenosis. Proximal circumflex was 20% stenosis. First obtuse marginal was 70% stenosis. Proximal RCA, there was 40% stenosis and a second lesion with 60% stenosis. Mid RCA with 20% stenosis. Distal RCA with 30% stenosis. Normal left ventricular ejection fraction of 65%. Recommendation is to continue medical management for CHF as well as angina and consultation for coronary artery bypass grafting.   The patient is a 79 year old Caucasian female with past medical history significant for history of tobacco abuse, who presented to the hospital with complaints of shortness of breath as well as chest tightness. Please refer to Dr. Rob HickmanGouru's admission note on the 23rd of February 2015. On arrival to the hospital, the patient's O2 sats were low, 80% on 4 liters of oxygen. She somewhat improved to 86 after DuoNebs, however, required BiPAP with FiO2 of 75% initially. Her vital signs: Temperature was 98.1. Pulse was 88. Respiratory rate was 20, blood pressure 115/60. Saturation was 98% on BiPAP. Physical exam was unremarkable. The patient did have bronchial breath sounds according to admitting physician as well as rhonchi. No other abnormalities were found. Lab data done on admission on the 22nd of February 2015 revealed elevation of BUN to 21. Otherwise,  BMP was unremarkable. The patient's CO2 level was 33. The patient's liver enzymes were normal. Cardiac enzymes x 3 were within normal limits. The patient's CBC was within normal limits. Blood cultures taken on the  22nd of February 2015 did not show any growth. ABGs were performed on 75% FiO2, showed pH of 7.50; pCO2 was 44; pO2 was 61; saturation was 96.3% on BiPAP with pressure support 12, PEEP of 6, mechanical rate of 12. EKG revealed accelerated junctional rhythm; possible inferior infarct, age indeterminate; ST-T wave abnormality, consider lateral ischemia. The patient's chest x-ray was concerning for congestive heart failure.   The patient was admitted to the hospital for further evaluation. She was started on diuretics. Consultations with pulmonologist as well as cardiologist were obtained. Dr. Gwen Pounds saw the patient in consultation the 23rd of February. He felt that the patient presented with significant progressive unstable angina with acute congestive heart failure without myocardial infarction. He recommended to continue BiPAP, continue oxygenation, and cardiac catheterization for assessment of unstable angina and new onset of heart failure. The patient proceeded to cardiac catheterization on the 24th of February 2015, which revealed significant disease in the left main, as well as moderate disease in LAD and diagonal, left circumflex, RCA stenosis with normal pulmonary pressures. Cardiologist recommended coronary artery bypass grafting. Due to patient's insurance issues, it was felt that [facility] cardiac surgery would be best suited for patient, and consultation with cardiac surgeon was obtained. The patient is being transferred to [facility] today on the 25th of February 2015. Her vital signs on the day of discharge: Temperature was 97.8. Pulse was 98. Respiratory rate was 18, blood pressure 108/59, saturation 94% on 2 liters of oxygen through nasal cannula at rest.   TIME SPENT: 40 minutes.  ____________________________ Katharina Caper, MD rv:jcm D: 03/08/2013 13:06:50 ET T: 03/08/2013 13:27:27 ET JOB#: [id number]  cc: Katharina Caper, MD, <Dictator> Myrtha Tonkovich MD ELECTRONICALLY SIGNED  03/15/2013 21:01

## 2014-05-05 NOTE — Discharge Summary (Signed)
PATIENT NAME:  Samantha Goodman, Renelda L MR#:  [id number] DATE OF BIRTH:  06/19/1935  DATE OF ADMISSION:  02/05/2013 DATE OF DISCHARGE:  02/07/2013  CHIEF COMPLAINT: Shortness of breath and productive cough.   DISCHARGE DIAGNOSES:  1.  Acute-on-chronic obstructive pulmonary disease exacerbation.  2.  Saturations more than 92% on 2 liters.   MEDICATIONS:  1.  Pravastatin 40 mg p.o. daily.  2.  ProAir HFA 2 puffs every four to 6 hourly.  3.  Singulair 10 mg in the evening.  4.  Symbicort 80/4.5, two puffs b.i.d.  5.  DuoNebs 3 mL via nebulizer every 4 hours as needed.  6.  Guaifenesin 400 mg t.i.d.  7.  Levothyroxine 50 mcg p.o. daily.  8.  Prednisone taper.  9.  Theophylline 24 at 100 mg one capsule daily.  10.  Levaquin 500 p.o. daily.   FOLLOWUP:  1.  Patient advised smoking cessation.  2.  Follow up with Dr. Elease HashimotoMaloney in 1 to 2 weeks.   BRIEF SUMMARY OF HOSPITAL COURSE: [name] is a 79 year old Caucasian female well known to our service from previous admissions. She comes to the Emergency Room after she started having increasing shortness of breath. She was admitted with:  1.  COPD exacerbation, acute on chronic, with history of tobacco abuse. She was started on IV Solu-Medrol, changed to p.o. taper. She was also empirically on Levaquin. The patient felt better, had a lot of cough with productive phlegm, and her breathing improved. Echo done in the past showed normal LV function with mild pulmonary hypertension. She has chronic home oxygen which she is advised to use. She also has nebulizer machine at home.  2.  Hypothyroidism. Synthroid was continued.  3.  Hyperlipidemia. The patient is on Pravachol.   Hospital stay otherwise remained stable.   CODE STATUS: THE PATIENT REMAINED A FULL CODE.   TIME SPENT: 40 minutes.   ____________________________ Wylie HailSona A. Allena KatzPatel, MD sap:np D: 02/09/2013 14:57:22 ET T: 02/09/2013 16:29:25 ET JOB#: [id number]  cc: Kollins Fenter A. Allena KatzPatel, MD, <Dictator> Willow OraSONA A  Palmer Shorey MD ELECTRONICALLY SIGNED 02/23/2013 13:15

## 2014-05-05 NOTE — H&P (Signed)
PATIENT NAME:  Samantha Goodman, Hadassa L MR#:  [id number] DATE OF BIRTH:  [birth year]  DATE OF ADMISSION:  12/27/2013  PRIMARY CARE PROVIDER: [name], MD  EMERGENCY DEPARTMENT REFERRING PHYSICIAN: Cecille AmsterdamJonathan E. Mayford KnifeWilliams, MD   CHIEF COMPLAINT: Shortness of breath.   HISTORY OF PRESENT ILLNESS: The patient is a 79 year old white female who actually was in the hospital from 12/22/2013 to 12/26/2013, who was admitted with acute on chronic obstructive pulmonary disease exacerbation, community-acquired pneumonia. She was admitted for respiratory difficulties, and was treated with antibiotics. She is readmitted with acute shortness of breath. The patient reports that when she was discharged, she was feeling better, but then all of a sudden got worse with her respiratory distress. The patient came to the ED and had to be placed on BiPAP. She has been admitted multiple times within the past 6 months. She denies any fevers or chills or chest pain.   PAST MEDICAL HISTORY: Chronic respiratory failure, secondary to COPD on 2 L of oxygen at home. Generalized anxiety disorder. Coronary artery disease, status post stent. Hypothyroidism.   PAST SURGICAL HISTORY: Coronary artery stents. Right eye surgery. Total hysterectomy.  Surgery of the left ankle. Left eye implant.   ALLERGIES: LEVAQUIN, CODEINE, AND SPIRIVA.   MEDICATIONS: Which the patient was recently discharged on yesterday: ProAir 2 puffs 4 times a day as needed, Plavix 75 p.o. daily, Singulair 10 daily, Tylenol 500 g 1-2 tablets as needed for pain, citalopram 20 daily, levothyroxine 88 mcg daily, iron sulfate 325 p.o. daily, Protonix 40 mg 1 tablet p.o. daily, metoprolol succinate 12.5 p.o. daily, Lasix 2 tablets in the morning, atorvastatin 40 at bedtime, theophylline 200 mg 1 capsule b.i.d., albuterol ipratropium 4 times a day as needed, Symbicort 2 puffs b.i.d., aspirin 81 mg 1 tablet p.o. daily, loratadine 10 mg 1 tablet p.o. daily, lorazepam 0.5 q. 6 hours  p.r.n., Vitamin D 1000 international units daily, Daliresp 500 mcg daily, Mucinex 600 mg 1 tablet p.o. t.i.d., prednisone taper, azithromycin 250 mg 1 tablet p.o. daily x 2 more days left.   SOCIAL HISTORY: Quit smoking about 4 weeks prior. No alcohol or drug use.   FAMILY HISTORY: Positive for heart disease and hypertension.   REVIEW OF SYSTEMS:  CONSTITUTIONAL: Denies any fevers, fatigue, weakness. No weight gain, no weight loss.  EYES: No blurred or double vision. No inflammation. Glaucoma.  EARS, NOSE, AND THROAT: No tinnitus, ear pain, hearing loss, epistaxis, or nasal discharge.  RESPIRATORY: Complains of cough, wheezing, shortness of breath. Has COPD. No hemoptysis. CARDIOVASCULAR: Denies any chest pain, orthopnea, edema, or arrhythmia.  GASTROINTESTINAL: No nausea, vomiting, diarrhea. No abdominal pain. No hematemesis.  GENITOURINARY: Denies any dysuria, hematuria, renal calculus, or frequency.  ENDOCRINE: Denies any nocturia,  or thyroid problems.  HEMATOLOGIC AND LYMPHATIC: Denies anemia, easy  bruising or bleeding. SKIN: No acne, no rash.  MUSCULOSKELETAL: Positive for arthritis. No gout.  NEUROLOGIC: No CVA, TIA, or seizures.  PSYCHIATRIC: No anxiety, insomnia, or depression.   PHYSICAL EXAMINATION:  VITAL SIGNS: Temperature 98.6, pulse 96, respirations 36, blood pressure 109/69, 95% on BiPAP.  GENERAL: The patient is a critically ill-appearing female with accessory muscle usage.  HEENT: Head atraumatic, normocephalic. Pupils equally round, reactive to light and accommodation. There is no conjunctival pallor. No scleral icterus. Nasal exam shows no drainage or ulceration. Oropharynx is clear, without any exudate.  NECK: Supple without any thyromegaly.  CARDIOVASCULAR: Regular rate and rhythm. No murmurs, rubs, clicks, or gallops.  LUNGS: Accessory muscle usage. There  is no wheezing throughout both lungs. She is currently on BiPAP.  ABDOMEN: Soft, nontender, nondistended.  Positive bowel sounds x 4. No hepatosplenomegaly.  EXTREMITIES: No clubbing, cyanosis, or edema.  SKIN: No rash.  LYMPH NODES: Nonpalpable.  MUSCULOSKELETAL: There is no erythema or swelling.  NEUROLOGIC: Cranial nerves II through XII grossly intact. No focal deficits.  PSYCHOLOGIC: Awake, alert, oriented x 3. No focal deficits.   LABORATORY DATA: Glucose 87, BUN 30, creatinine 0.99, sodium 141, potassium 3.1, chloride 102, CO2 of 32, calcium 8.8. LFTs: Total protein 7.2, albumin 3.4, bilirubin total 0.4, alkaline phosphatase 135, AST 27, ALT 28. Troponin less than 0.02. WBC 13.6, hemoglobin 12.5, platelet count is 409,000.   Chest x-ray shows chronic lung disease with stable appearance of recently suspected infectious process in the right lung base.   ASSESSMENT AND PLAN: The patient is a 79 year old white female with recent diagnosis of community-acquired pneumonia, acute respiratory failure, who presents back with respiratory failure.  1.  Acute on chronic respiratory failure due to acute on chronic obstructive pulmonary disease exacerbation. At this time, we will treat her with nebulizers, IV Solu-Medrol, and I will place her on IV antibiotics. We will get pulmonary consult. She is a Gold Patient,  we will make a notice of.  2.  Anxiety, depression. Continue Celexa and Ativan p.r.n. 3.  Coronary artery disease. Continue aspirin, Plavix, and metoprolol.  4.  Hyperlipidemia. Continue atorvastatin.  5.  Hypothyroidism. Continue Synthroid.  6.  Miscellaneous. The patient will be on Lovenox for deep vein thrombosis prophylaxis.  7.  Full Code. At this point, I will ask palliative care team to assist with input with the code.  8.  Nicotine abuse. Reinforce smoking cessation, 4 minutes spent. Nicotine patch recommended to stop smoking.   The patient voiced his understanding.   CRITICAL CARE TIME SPENT: 60 minutes on this patient.     ____________________________ Lacie Scotts. Allena Katz,  MD shp:MT D: 01/02/2014 12:32:00 ET T: 01/02/2014 13:04:26 ET JOB#: [id number]  cc: Oreoluwa Aigner H. Allena Katz, MD, <Dictator> Charise Carwin MD ELECTRONICALLY SIGNED 01/11/2014 11:03

## 2014-05-05 NOTE — H&P (Signed)
PATIENT NAME:  Samantha, Goodman MR#:  [id number] DATE OF BIRTH:  September 08, 1935  DATE OF ADMISSION:  02/05/2013  REFERRING PHYSICIAN: Sheryl L. Mindi Junker, MD  FAMILY PHYSICIAN: Samantha Grosser, MD  REASON FOR ADMISSION: Acute respiratory distress.   HISTORY OF PRESENT ILLNESS: The patient is a 79 year old female with a history of COPD and chronic bronchitis, on chronic oxygen at home. Presents to the Emergency Room with worsening shortness of breath, cough and fatigue despite outpatient treatment with oral antibiotics and prednisone taper. In the Emergency Room, the patient was given IV steroids and breathing treatments with no improvement of her symptoms. She is now admitted for further evaluation.   PAST MEDICAL HISTORY: 1.  COPD.   2.  Chronic bronchitis.  3.  Chronic respiratory failure, on oxygen.  4.  Hypothyroidism.  5.  Hyperlipidemia.  6.  Previous eye surgery.   MEDICATIONS: 1.  Theophylline 24, 200 mg p.o. daily.  2.  Symbicort 80/4.5 two  puffs b.i.d.  3.  ProAir 2 puffs q.4 hours p.r.n. shortness of breath.  4.  Pravastatin 40 mg p.o. daily.  5.  Singulair 10 mg p.o. daily.  6.  Synthroid 50 mcg p.o. daily.  7.  DuoNeb SVNs q.6 hours p.r.n. shortness of breath.   ALLERGIES: CODEINE AND SPIRIVA.   SOCIAL HISTORY: The patient has a remote history of tobacco abuse. No history of alcohol abuse.   FAMILY HISTORY: Positive for diabetes, hypertension, stroke and coronary artery disease. Negative for breast or colon cancer.   REVIEW OF SYSTEMS: CONSTITUTIONAL: The patient states she has had fevers and chills. Denies weight loss.  EYES: No blurred or double vision. No glaucoma.  ENT: No tinnitus or hearing loss. No nasal discharge or bleeding. No difficulty swallowing.  RESPIRATORY: No hemoptysis. No painful respiration.  CARDIOVASCULAR: No chest pain or orthopnea. No palpitations or syncope.  GASTROINTESTINAL: Some nausea but no vomiting or diarrhea. No abdominal pain or change  in bowel habits.  GENITOURINARY: No dysuria or hematuria. No incontinence.  ENDOCRINE: No polyuria or polydipsia. No heat or cold intolerance.  HEMATOLOGIC: The patient denies anemia, easy bruising or bleeding.  LYMPHATIC: No swollen glands.  MUSCULOSKELETAL: The patient denies pain in her neck, back, shoulders, knees or hips. No gout.  NEUROLOGIC: No numbness or migraines. Denies stroke or seizures.  PSYCHIATRIC: The patient denies anxiety, insomnia or depression.   PHYSICAL EXAMINATION: GENERAL: The patient is acutely ill-appearing, in moderate respiratory distress.  VITAL SIGNS: Remarkable for a blood pressure of 133/60, heart rate of 89, respiratory rate of 36, temperature of 97.7.  HEENT: Normocephalic, atraumatic. Pupils equally round and reactive to light and accommodation. Extraocular movements are intact. Sclerae are anicteric. Conjunctivae are clear. Oropharynx is dry but clear.  NECK: Supple without JVD. No adenopathy or thyromegaly is noted.  LUNGS: Revealed scattered wheezes and rhonchi with decreased breath sounds at the bases. No dullness. Decreased breath sounds throughout.  CARDIAC: Regular rate and rhythm. Normal S1 and S2. No significant rubs, murmurs or gallops. PMI was nondisplaced. Chest wall was nontender.  ABDOMEN: Soft, nontender, with normoactive bowel sounds. No organomegaly or masses were appreciated. No hernias or bruits were noted.  EXTREMITIES: Without clubbing, cyanosis or edema. Pulses were 2+ bilaterally.  SKIN: Warm and dry without rash or lesions.  NEUROLOGIC: Cranial nerves II through XII grossly intact. Deep tendon reflexes were symmetric. Motor and sensory exam is nonfocal.  PSYCHIATRIC: Revealed a patient who was alert and oriented to person, place and time. She  was cooperative and used good judgment.   LABORATORY, DIAGNOSTIC AND RADIOLOGICAL DATA:  EKG revealed sinus rhythm with no acute ischemic changes. Chest x-ray showed COPD with no acute findings.  White count was 9.3 with a hemoglobin of 12.5. Glucose was 205 with a BUN of 18, creatinine of 0.83 with a sodium of 139, a potassium of 3.7 and a GFR of greater than 60. Her troponin was less than 0.02.   ASSESSMENT: 1.  Acute on chronic respiratory failure.  2.  Chronic obstructive pulmonary disease exacerbation.  3.  Presumed bronchitis.  4.  Hyperglycemia.  5.  Hypothyroidism.  6.  Hyperlipidemia.  7.  Anxiety.   PLAN: The patient will be admitted to the floor with IV steroids, IV antibiotics, DuoNeb SVNs and oxygen. We will continue her Singulair and theophylline. We will check a TSH and a theophylline level. We will send sputum off for Gram stain and culture. We will follow her sugars while on steroids. Follow up chest x-ray and labs in the morning. Further treatment and evaluation will depend upon the patient's progress.   TOTAL TIME SPENT ON THIS PATIENT: 50 minutes.   ____________________________ Samantha LopeJeffrey D. Judithann SheenSparks, MD jds:cs D: 02/05/2013 20:17:18 ET T: 02/05/2013 20:29:21 ET JOB#: [id number]  cc: Samantha LopeJeffrey D. Judithann SheenSparks, MD, <Dictator> Samantha GrosserNancy J. Maloney, MD Samantha Rolon Rodena Medin Zyona Pettaway MD ELECTRONICALLY SIGNED 02/06/2013 8:01

## 2014-05-05 NOTE — Discharge Summary (Signed)
PATIENT NAME:  Samantha Goodman, Samantha Goodman MR#:  [id number] DATE OF BIRTH:  [birth year]  PRESENTING COMPLAINT: Shortness of breath.   DISCHARGE DIAGNOSES: 1.  Acute on chronic respiratory failure with hypoxia.  2.  Acute chronic obstructive pulmonary disease exacerbation.  3.  Ongoing tobacco abuse.  4.  Generalized anxiety disorder.  5.  Coronary artery disease, status post PCI with stent placement.  6.  Hypothyroidism.   CONSULTATIONS: None.   PROCEDURES: 1.  Chest x-ray, November 18, shows recurrent slight atelectasis and small effusion at the left lung base. Emphysematous changes.  2.  Chest x-ray, November 19, shows stable left basilar opacity consistent with subsegmental atelectasis and small associated pleural effusion.  3.  Chest x-ray, November 23, shows no acute findings identified. Chronic changes suggestive of COPD.   HISTORY OF PRESENT ILLNESS: This 79 year old Caucasian female with past medical history of chronic respiratory failure on 2 liters of oxygen at home and advanced COPD and continued tobacco use, presents to the Emergency Room with worsening shortness of breath and wheezing. Her oxygenation on presentation was in the 80s percent on 2 liters. She increased her oxygen to 2.5 liters with no significant improvement. She received nebulizer treatment in the Emergency Room without improvement. Hospitalist services were asked to admit for further evaluation and treatment.  HOSPITAL COURSE BY PROBLEM:  1.  Acute on chronic respiratory failure due to acute COPD exacerbation. The patient was started on Solu-Medrol, DuoNeb treatments, Rocephin and supplemental oxygen. She was initially maintained at 4 liters via nasal cannula; however, overnight on the 20th, she developed acute shortness of breath and was placed on BiPAP for the night. In the morning she resumed nasal cannula at 3 liters. She was then stable on 3 to 4 liters throughout the rest of her hospitalization. She is discharged on  prednisone, Symbicort, montelukast,  ProAir p.r.n., theophylline and increased oxygen. She is also on Bactrim as her sputum culture grew Enterobacter cloacae which was sensitive to Bactrim. She will need to complete a 10-day course of this medication. She is unfortunately ALLERGIC TO SPIRIVA and cannot tolerate that medication. She will also have home health nursing for skilled assessment, oxygen monitoring and medication education.  2.  Ongoing tobacco abuse: The patient was counseled multiple times throughout her hospitalization to avoid ongoing tobacco abuse. She is discharged with a prescription for nicotine patch. She is advised that continuing smoking will likely result in recurrent COPD exacerbation.  3.  Generalized anxiety disorder. It seems that the patient's episodes of acute shortness of breath are accompanied by or possibly caused by significant anxiety. I discussed this with her family who agreed that the patient is often very anxious. We started her on an SSRI during this hospitalization and gave her a prescription for p.r.n. lorazepam to use for anxiety. I discussed the use of lorazepam for symptoms of shortness of breath with the patient. She agrees that anxiety is often present when she is acutely short of breath. Hopefully, use of these medications will help prevent further exacerbations.  4.  Coronary artery disease, status post PCI with stenting: The patient did not have chest pain throughout the hospitalization. She continues on aspirin, Plavix, statin and a beta blocker. EKG did not show any significant changes. Troponins were negative throughout the admission.  5.  Hypothyroidism: The patient will continue on Synthroid. TSH was not checked during this hospitalization.   DISCHARGE MEDICATIONS: 1.  ProAir HFA CFC-free 90 mcg/inhalation inhaler 2 puffs inhaled 4 times a day as  needed for shortness of breath.  2.  Clopidogrel 75 mg 1 tablet once a day at bedtime.  3.  Atorvastatin 40 mg  1 tablet once a day at bedtime.  4.  Ferrous sulfate 325 mg 1 tablet orally 3 times a day.  5.  Symbicort 80 mcg/4.5 mcg inhalation, 2 puffs inhaled 2 times a day.  6.  Albuterol 2.5 mg/3 mL, 0.083% inhalation solution, 3 mL inhaled 3 times a day.  7.  Montelukast 10 mg 1 tablet once a day in the morning.  8.  Tylenol 500 mg 1 tablet twice a day as needed for pain.  9.  Aspirin 81 mg 1 tablet once a day.  10.  Citalopram 20 mg 1 tablet once a day.  11.  Furosemide 40 mg 1 tablet once a day.  12.  Levothyroxine 88 mcg 1 tablet once a day.  13.  Metoprolol succinate 25 mg 1 tablet once a day.  14.  Mucinex 600 mg extended release 1 tablet every 12 hours.  15  Theophylline 24 mg/24 hour oral capsule, 1 capsule once a day.  16.  Prednisone 50 mg, decrease by 10 mg every other day and then stop.  17.  Nicotine 14 mg/24 hour transdermal film, one patch once a day.  18.  Bactrim DS 800 mg/160 mg oral tablet 1 tablet twice a day for 5 days and then stop.  19.  Lorazepam 0.25 mg orally every 6 hours as needed for anxiety.   CONDITION ON DISCHARGE: Fair.   DISPOSITION: Discharged to her granddaughter's house with home health nursing and nurse's aide.   DISCHARGE INSTRUCTIONS:  DIET: Heart healthy diet.   ACTIVITY: As tolerated.   Please stop smoking cigarettes.   TIMEFRAME FOR FOLLOWUP:   1.  In 1-2 weeks with Dr. Elease HashimotoMaloney.  2.  The patient is discharged with home oxygen as she had in the past, but rate has been increased to 4 liters until followup with Dr. Elease HashimotoMaloney.   TIME SPENT ON DISCHARGE: 45 minutes.   ____________________________ Ena Dawleyatherine P. Clent RidgesWalsh, MD cpw:LT D: 12/05/2013 20:13:41 ET T: 12/05/2013 20:43:32 ET JOB#: [id number]  cc: Ena Dawleyatherine P. Clent RidgesWalsh, MD, <Dictator> Gale JourneyATHERINE P WALSH MD ELECTRONICALLY SIGNED 12/16/2013 11:48

## 2014-05-05 NOTE — Discharge Summary (Signed)
PATIENT NAME:  Samantha Goodman, Eleina L MR#:  [id number] DATE OF BIRTH:  1935-09-20  DATE OF ADMISSION:  08/29/2013 DATE OF DISCHARGE:  09/01/2013  DISCHARGE DIAGNOSES:  1.  Acute on chronic respiratory failure due to chronic obstructive pulmonary disease exacerbation.  2.  Coronary artery disease. 3.  Hypothyroidism.   DISCHARGE MEDICATIONS:  1.  Theophylline 100 mg 1 tablet daily.  2.  Levothyroxine 88 mcg p.o. daily.  3.  ProAir 90 mcg 2 puffs 4 times daily as needed for wheezing.  4.  Albuterol nebulizer every 6 hours as needed for wheezing.  5.  Metoprolol succinate 25 mg daily.  6.  Plavix 75 mg daily.  7.  Aspirin 81 mg daily.  8.  Pantoprazole 40 mg p.o. daily.  9.  Atorvastatin 40 mg p.o. daily.  10. Lasix 40 mg p.o. daily.  11. Ferrous sulfate 325 mg p.o. daily.  12. Celexa 20 mg p.o. daily.  13. Symbicort 80/4.5 mcg 2 puffs 2 times daily.  14. Singulair 10 mg p.o. daily.  14. Prednisone 20 mg 3 tablets daily for 2 days, 2 tablets daily for 2 days, then 1 tablet daily for 2 days.  15. Nicotine patch 14 mg daily.  16. Levaquin 200 mg every 24 hours. The patient was given Levaquin for 10 days.  PRIMARY DOCTOR:  Dr. Lorie PhenixNancy Maloney.   HOSPITAL COURSE: This is a 79 year old female patient with history of coronary artery disease, COPD on home oxygen, who comes in because of trouble breathing. The patient admitted to hospitalist service for COPD exacerbation. The patient has 3 liters of oxygen at home. She had wheezing, and productive cough. She was started on IV steroids, nebulizers, antibiotics. The patient is on theophylline, Symbicort, nebulizers. The patient's chest x-ray did not show any acute infiltrate. Theophylline level was 2.7 on admission. The patient's white count was 15.6. The patient's symptoms nicely improved with antibiotics, steroids and nebulizers, and the patient discharged home with a tapering course of steroids and Levaquin. The patient continues to smoke. She wanted a  nicotine patch prescription so we have given her nicotine patch prescription.   The patient's other diagnoses include hypokalemia and hypomagnesemia, potassium level was 3.  Magnesium level was initially 2.1   then dropped to  1.9. The patient got replacement and the patient's initial white count was 15 and it was up secondary to steroids and the patient did not have any fever and her kidney function stayed stable.  History of hypothyroidism. The patient is on Synthroid 88 mcg daily.   DISCHARGE VITAL SIGNS: Temperature 97.3, heart rate 72, blood pressure 109/75, oxygen saturation 98% on 3 liters.    DISPOSITION:  She went home in stable condition and the patient advised to follow with her primary doctor, Dr. Lorie PhenixNancy Maloney in 1 week. We continued on her heart medications. She has history of coronary artery disease. She is on aspirin, Plavix, metoprolol, and atorvastatin and will continue that.    Time spent on discharge preparation: More than 30 minutes.   ____________________________ Katha HammingSnehalatha Juanjesus Pepperman, MD sk:lt D: 09/03/2013 08:19:25 ET T: 09/03/2013 09:16:09 ET JOB#: [id number]  cc: Katha HammingSnehalatha  Ducre, MD, <Dictator> Katha HammingSNEHALATHA Santasia Rew MD ELECTRONICALLY SIGNED 09/19/2013 13:25

## 2014-05-05 NOTE — H&P (Signed)
PATIENT NAME:  Samantha Goodman, Adia L MR#:  [id number] DATE OF BIRTH:  29-Apr-1935  DATE OF ADMISSION:  02/08/2013  REFERRING PHYSICIAN:  Dr. Margarita GrizzleWoodruff.   PRIMARY CARE PHYSICIAN: Dr. Elease HashimotoMaloney.   CHIEF COMPLAINT: Shortness of breath.   A 79 year old Caucasian female with past medical history of COPD on chronic oxygen therapy, 2 liters nasal cannula at baseline, presenting with shortness of breath. She describes a 1 day duration of shortness of breath, mainly as dyspnea on exertion with associated cough productive of white sputum, which is chronic in nature. She denies any fevers, chills. [facility] was [facility] exacerb[facility] and felt that since discharge she is slowly worsening, as far as her breathing status. As far as dyspnea on exertion goes, she states after walking a few feet she is markedly out of air and takes many hours before she feels normal again. She denies any known sick contacts.     REVIEW OF SYSTEMS:   CONSTITUTIONAL: Denies fever, fatigue, weakness.  EYES: No blurred vision, double vision or eye pain. HEENT:  Denies tinnitus, ear pain or hearing loss.    RESPIRATORY: Positive for cough, wheeze, shortness of breath as described above.  CARDIOVASCULAR: Denies chest pain, palpitations, edema.  GASTROINTESTINAL: Denies nausea, vomiting, diarrhea, abdominal pain.  GENITOURINARY: Denies dysuria or hematuria.  ENDOCRINE: Denies nocturia or thyroid problems.  HEMATOLOGIC AND LYMPHATICS:  Denies easy bruising or bleeding.  SKIN: Denies rash or lesions.  MUSCULOSKELETAL: Denies pain in neck, back, shoulder, knees, hips or arthritic symptoms.  NEUROLOGIC: Denies paralysis, paresthesias. PSYCHIATRIC:  Denies anxiety or depressive symptoms.   PAST MEDICAL HISTORY: COPD, 2 liters nasal cannula at baseline; hyperlipidemia, hypothyroidism.   FAMILY HISTORY: Positive for diabetes, hypertension, coronary artery disease.   SOCIAL HISTORY: Positive for remote tobacco  abuse. Denies any alcohol or drug usage.   ALLERGIES: CODEINE AND SPIRIVA.   HOME MEDICATIONS:  Include prednisone, currently on 60 mg taper; pravastatin 40 mg p.o. at bedtime, DuoNeb therapy 2.5/0.5 mg 3 mL inhalation every 4 hours as needed for shortness of breath, ProAir 90 mcg inhalation 2 puffs every 4 to 6 hours as needed for shortness of breath, Symbicort 80/4.5 mcg inhalation 2 puffs b.i.d., theophylline 100 mg extended release p.o. daily, guaifenesin 400 mg p.o. 3 times daily as needed for cough, montelukast 10 mg p.o. daily, Levaquin 500 mg p.o. daily for 6 days, Synthroid 50 mcg p.o. daily.   PHYSICAL EXAMINATION: VITAL SIGNS: Afebrile, saturating 82% on 4 liters nasal cannula. The remainder of vital signs have been reviewed and are within normal limits. GENERAL: Well-nourished, well-developed, Caucasian female, currently in minimal distress secondary to respiratory status.  HEAD: Normocephalic, atraumatic.  EYES: Pupils equal, round and reactive to light.  Extraocular muscles intact. No scleral icterus.  MOUTH: Moist mucosal membranes. Dentition intact. No abscess noted.  EAR, NOSE, THROAT:  Clear without exudates. No external lesions.  NECK: Supple. No thyromegaly. No nodules. No JVD.  PULMONARY: Diffuse coarse breath sounds with scattered rhonchi and prolonged expiratory phase. No audible wheezing. No use of accessory muscles. Good respiratory effort though conversational dyspnea.  CHEST: Nontender to palpation.  CARDIOVASCULAR: S1, S2, regular rate and rhythm. No murmurs, rubs or gallops. No edema. Pedal pulses 2+ bilaterally.  GASTROINTESTINAL: Soft, nontender, nondistended. No masses. Positive bowel sounds. No hepatosplenomegaly.  MUSCULOSKELETAL: No swelling, clubbing, edema. Range of motion full in all extremities.  NEUROLOGIC: Cranial nerves II through XII intact. No gross focal neurological deficits. Sensation intact. Reflexes intact.  SKIN: No ulcerations, lesions, rashes,  cyanosis. Skin warm, dry. Turgor intact.  PSYCHIATRIC: Mood and affect within normal limits. The patient is awake, alert and oriented x 3. Insight and judgment intact.   LABORATORY DATA: Sodium 140, potassium 4, chloride 105, bicarb 28, BUN 27, creatinine 0.72, glucose 117. LFTs within normal limits. WBC of 14.8, hemoglobin of 13.5, platelets of 372.   Chest x-ray performed revealing persistent small left effusion at the left base consistent with atelectasis. No change from previous exam.   ASSESSMENT AND PLAN: A 79 year old Caucasian female with history of chronic obstructive pulmonary disease on 2 liters nasal cannula at baseline, presenting with worsening shortness of breath.  1.  Chronic obstructive pulmonary disease exacerbation. Provide DuoNeb therapy q.4 hours, supplemental O2 to keep SaO2 greater than 90%, Levaquin for antibiotic coverage and flutter valve therapy. Continue with Symbicort and Singulair at home doses.  2.  Hypothyroidism. Continue home dose of Synthroid.  3.  Hyperlipidemia. Continue with pravastatin.  4.  Leukocytosis without evidence of infection, most likely related to prednisone therapy. We will continue Levaquin as mentioned above.  5.  Venous thromboembolism prophylaxis with heparin subQ.   The patient is FULL CODE.   TIME SPENT: 45 minutes.    ____________________________ Cletis Athens. Hower, MD dkh:dmm D: 02/08/2013 21:33:37 ET T: 02/08/2013 21:56:30 ET JOB#: [id number]  cc: Cletis Athens. Hower, MD, <Dictator> DAVID Synetta Shadow MD ELECTRONICALLY SIGNED 02/08/2013 23:54

## 2014-05-05 NOTE — Consult Note (Signed)
   Comments   Events of today noted with change in discharge plan since our meeting with pt and family yesterday. I have spoken with medical director of the [facility] and pt is not appropriate for admission at this time. I met with pt and daughter and explained situation. Discharge plan now unclear. Will readdress in AM. Pt will likely need STR at discharge. She is now agreeable with this. Chaplain and hospice liason present for discussion.  pt to ask for morphine &/or lorazapam as needed. Also encouraged RN to assess pt and offer if indicated.   Electronic Signatures: Estle Huguley, Izora Gala (MD)  (Signed 05-Feb-15 20:24)  Authored: Palliative Care   Last Updated: 05-Feb-15 20:24 by Christina Waldrop, Izora Gala (MD)

## 2014-05-05 NOTE — H&P (Signed)
PATIENT NAME:  Samantha Goodman, Samantha Goodman MR#:  [id number] DATE OF BIRTH:  10-27-1935  DATE OF ADMISSION:  08/29/2013  Addendum  Tobacco dependence. The patient was counseled for 3 minutes regarding smoking dependence. She said she quit today.  She does want a nicotine patch. We encouraged her to continue to try to quit smoking.    ____________________________ Abella Shugart P. Juliene PinaMody, MD spm:DT D: 08/29/2013 13:29:35 ET T: 08/29/2013 13:43:50 ET JOB#: [id number]  cc: Reshard Guillet P. Juliene PinaMody, MD, <Dictator> Janyth ContesSITAL P Kenniya Westrich MD ELECTRONICALLY SIGNED 08/29/2013 15:29

## 2014-05-05 NOTE — Discharge Summary (Signed)
PATIENT NAME:  Samantha Samantha Goodman, Samantha Samantha Goodman MR#:  [id number] DATE OF BIRTH:  02/28/1935  DATE OF ADMISSION:  12/22/2013 DATE OF DISCHARGE:  12/26/2013  DISCHARGE DIAGNOSES:  1.  Acute chronic obstructive pulmonary disease exacerbation.  2.  Chronic respiratory failure with hypoxia requiring 2 Samantha Goodman nasal cannula.  3.  Community-acquired pneumonia.  4.  Coronary artery disease.  5.  Ongoing tobacco abuse.  6.  Possible obstructive sleep apnea.  7.  Anxiety.  8.  Hypothyroidism.   CONSULTATIONS: [name], MD, pulmonology.   PROCEDURES: Chest x-ray December 13 showing new pneumonia in the lingula and worsening changes of acute bronchitis and/or asthma. Stable pneumonia in the right mid lobe and right lower lobe. No other procedures.   BRIEF HISTORY OF PRESENT ILLNESS: Samantha Samantha Goodman is a 79 year old Caucasian female with a history of chronic respiratory failure secondary to COPD on 2 Samantha Goodman of oxygen at home, coronary artery disease status post stenting, hypothyroidism, who presented to the Emergency Room with worsening shortness of breath. She has had multiple hospitalizations over the past few months. She states that she has stopped smoking since her last discharge and has not been smoking. She had been doing well until the day prior to discharge when she began to have shortness of breath. She did go to her pulmonologist on the day prior to admission and was called in the morning of admission with a diagnosis of pneumonia. She was started on prednisone taper and Z-Pak. She then developed worsening dyspnea and presented to the Emergency Room. She is being admitted for pneumonia and COPD exacerbation.   HOSPITAL COURSE BY PROBLEM:  1.  Acute COPD exacerbation: She was started on Solu-Medrol, nebulizer treatments. She continued on her home inhalers as well as Singulair and theophylline. She was seen inpatient by her pulmonologist, Dr. Welton FlakesKhan. She will be following up with him on the day after discharge for further  evaluation and to discuss possible CPAP at home. During the hospitalization she was titrated to oral steroids and by the time of discharge was comfortable on 2 Samantha Goodman nasal cannula as she is on at home.  2.  Community-acquired pneumonia: Chest x-ray at outpatient pulmonology appointment showed possible pneumonia initially treated with azithromycin, then switched on admission to Rocephin and azithromycin. Blood cultures were negative. Respiratory status improved significantly throughout the hospitalization. On discharge, she continued on oral azithromycin to complete a 7-day course.  3.  Possible obstructive sleep apnea: The patient reports that in the past she has had a CPAP machine at her home but it broke. She never had this machine replaced. She will follow up with Dr. Welton FlakesKhan on the day after discharge to discuss the need for CPAP in the future.  4.  Possible ongoing tobacco abuse: The patient reports that she has quit smoking. There is significant suspicion among her family members that she continues to smoke. On discharge she is going to be moving in with her granddaughter, which will provide additional support for her efforts in smoking cessation.  5.  Anxiety: The patient has recently started an SSRI and has available p.r.n. benzodiazepines to help with anxiety. She does admit that when she becomes short of breath, she becomes acutely anxious and this may escalate her episodes of dyspnea. She will continue with her home regimen.  6.  Coronary artery disease: The patient was asymptomatic throughout hospitalization, with no chest pain. She will continue on aspirin, statin, and Toprol.   PHYSICAL EXAMINATION:  VITAL SIGNS: Temperature 97.8, pulse 75,  respirations 18, blood pressure 116/66, oxygenation 95% on 2 Samantha Goodman nasal cannula.  GENERAL: No acute distress.  RESPIRATORY: There are scattered wheezes and rhonchi. Good air movement, no respiratory distress. Easily conversant and comfortable.  CARDIOVASCULAR:  Regular rate and rhythm. No murmurs, rubs, or gallops. No peripheral edema. Peripheral pulses are 1+.  NEUROLOGIC: Cranial nerves II through XII grossly intact. Strength and sensation intact, nonfocal.  PSYCHIATRIC: The patient is calm, alert and oriented x 4, has good insight into her clinical condition, is eager to go home. No signs of uncontrolled anxiety or depression at this time.   LABORATORY DATA: Sodium 141, potassium 3.1 (which was repleted with 40 mcg of potassium twice prior to discharge), chloride 102, bicarbonate 32, BUN 30, creatinine 0.99, magnesium 2.4. Troponin less than 0.02 on admission; it was not repeated. White blood cells 9.7, hemoglobin 11.2, platelets 319,000, MCV 84.   CONDITION ON DISCHARGE: Stable.   DISCHARGE MEDICATIONS: 1.  ProAir HFA CFC free 90 mcg/inhalation inhaler 2 puffs 4 times a day as needed for shortness of breath or wheezing. 2.  Clopidogrel 75 mg 1 tablet once a day. 3.  Montelukast 10 mg 1 tablet once a day. 4.  Tylenol 500 mg 1 tablet 2 times a day as needed for pain. 5.  Citalopram 20 mg 1 tablet once a day. 6.  Levothyroxine 88 mcg 1 tablet once a day.  7.  Ferrous sulfate 325 mg 1 tablet once a day. 8.  Pantoprazole 40 mg 1 tablet once a day.  9.  Metoprolol succinate 25 mg 0.5 tablets once a day in the morning.  10.  Furosemide 20 mg 2 tablets once a day in the morning.  11.  Atorvastatin 40 mg 1 tablet once a day in the evening. 12.  Theophylline 200 mg/24 hour oral capsule, 1 capsule twice a day. 13.  Albuterol/ipratropium 2.5 mg/0.5 mg/3 mL, 3 mL inhaled 4 times a day as needed for shortness of breath. 14.  Symbicort 160 mcg/4.5 mcg, 2 puffs inhaled 2 times a day. 15.  Aspirin 81 mg 1 tablet once a day. 16.  Loratadine 10 mg 1 tablet once a day.  17.  Lorazepam 0.5 mg every 6 hours as needed for anxiety. 18.  Vitamin D 1000 international units 1 capsule once a day. 19.  Daliresp 500 mcg 1 tablet once a day in the morning.  20.  Mucinex  600 mg 1 tablet 3 times a day.  21.  Prednisone oral taper starting with 40 mg for 2 days, decrease by 10 mg every 2 days and then stop.  22.  Azithromycin 250 mg 1 tablet once a day for 3 days.   DISPOSITION: The patient is being discharged to the care of her family. She will be living with her granddaughter.   DISCHARGE INSTRUCTIONS:   DIET: Regular diet.   ACTIVITY: No restrictions.   TIMEFRAME FOR FOLLOWUP: Follow up within 1 to 2 weeks with Dr. Elease Hashimoto. An appointment has been made with Dr. Welton Flakes for Thursday December 17 at 11:30 a.m. The patient remains on 2 Samantha Goodman via nasal cannula.   TIME SPENT ON DISCHARGE: 40 minutes.   ____________________________ Ena Dawley. Clent Ridges, MD cpw:ST D: 12/28/2013 10:10:01 ET T: 12/28/2013 21:35:47 ET JOB#: [id number]  cc: Santina Evans P. Clent Ridges, MD, <Dictator> Gale Journey MD ELECTRONICALLY SIGNED 01/03/2014 9:10

## 2014-05-05 NOTE — H&P (Signed)
PATIENT NAME:  Samantha Goodman, Samantha Goodman MR#:  [id number] DATE OF BIRTH:  1935/12/21  DATE OF ADMISSION:  03/06/2013  ADDENDUM - This is a continuation of History and physical   ASSESSMENT AND PLAN: A 79 year old female with shortness of breath and chest tightness since last night. Several admissions in the past 3 months for similar complaint of shortness of breath. Will be admitted with the following assessment and plan:  1. Acute on chronic hypoxic respiratory failure secondary to acute exacerbation of chronic obstructive pulmonary disease: New onset congestive heart failure, mild. Also, the patient with a history of working in [employer] industry for 35 years. Will admit her to critical care unit stepdown, The patient is currently on BiPAP. Will provide her intravenous steroids, nebulizer treatments and levofloxacin. Will continue theophylline which is her home medication and check theophylline level in a.m. The patient will be on intravenous Lasix. Will consider starting her on beta blocker once chest tightness is significantly improved. Cycle cardiac biomarkers. Cardiology consult is placed to Dr. Lady GaryFath as requested by the family. Pulmonary consult is placed. The patient has professional history of working in [employer] industry and approximately a 50-pack-year smoking history which could have caused some underlying bronchopulmonary disease versus pulmonary fibrosis. The patient will get  CAT scan of the chest.  2. Nicotine dependence: States that she quit smoking 3 to 4 weeks ago.  3. Hypothyroidism: Continue Synthroid.  4. Hypertension: Blood pressure is stable and the patient is not on any home medications.  5. Will provide her gastrointestinal and deep vein thrombosis prophylaxis.  6. Will repeat arterial blood gases.   She is FULL CODE. Two daughters have medical power of attorney. Diagnosis and plan of care was discussed in detail with the patient and her family members at bedside. They verbalized  understanding of the plan.   TOTAL TIME SPENT ON THE ADMISSION: 45 minutes.   ____________________________ Ramonita LabAruna Jabier Deese, MD ag:gb D: 03/06/2013 00:28:45 ET T: 03/06/2013 03:47:12 ET JOB#: [id number]  cc: Ramonita LabAruna Ethelbert Thain, MD, <Dictator> Ramonita LabARUNA Carmichael Burdette MD ELECTRONICALLY SIGNED 03/18/2013 5:38

## 2014-05-05 NOTE — H&P (Signed)
PATIENT NAME:  Samantha Goodman, Samantha Goodman MR#:  [id number] DATE OF BIRTH:  19-Jan-1935  DATE OF ADMISSION:  12/04/2013   PRIMARY CARE PHYSICIAN:  Leo Grosser, MD  REFERRING PHYSICIAN:  Gladstone Pih, MD   CHIEF COMPLAINT:  Shortness of breath and wheezing today.   HISTORY OF PRESENT ILLNESS:  A 79 year old Caucasian female with a history of COPD with chronic respiratory failure, who was sent from home to the ED due to shortness of breath and wheezing. Actually, the patient was just discharged 4 hours ago from our hospital after treatment of COPD exacerbation. The patient is alert, awake, oriented, and in no acute distress. She is on BiPAP now. The patient said that after arriving home, the patient started to have shortness of breath and wheezing with cough and sputum, so she was sent back to the ED. The patient denies any fever or chills. No chest pain, palpitations, orthopnea, or nocturnal dyspnea. The patient denies any other symptoms. She said that she used a nebulizer and antibiotics, but her symptoms are still getting worse. The patient's oxygen saturation decreased to the 80s in the ED, and she was placed on BiPAP and treated with a nebulizer.   PAST MEDICAL HISTORY:  COPD, chronic hypoxic respiratory failure on oxygen 2 to 3 liters, CAD status post stent placement, hypothyroidism.   PAST SURGICAL HISTORY:  Right and left thigh implant, CAD status post stent.   FAMILY HISTORY:  CAD.   SOCIAL HISTORY:  She lives at home. She smokes 1/2 pack a day. Denies any alcohol drinking or illicit drugs. The patient denies any smoking after discharge today.   ALLERGIES:  CODEINE, LEVAQUIN, AND SPIRIVA.   HOME MEDICATIONS:   1.  Albuterol 2.5 mg per 3 mL 3 mL inhaled 3 times a day.  2.  Aspirin 81 mg p.o. daily. 3.  Atorvastatin 40 mg p.o. at bedtime. 4.  Escitalopram 20 mg p.o. in the morning.  5.  Plavix 75 mg p.o. daily. 6.  Ferrous sulfate 325 mg p.o. daily.  7.  Lasix 20 mg p.o. 2 tablets daily.   8.  Levothyroxine 88 mcg p.o. daily.  9.  Loratadine 10 mg p.o. daily.  10.  Lorazepam 0.5 mg 0.5 tablet every 6 hours p.r.n. for anxiety.  11.  Lopressor 25 mg p.o. tablet 0.5 tablet once a day. 12.  Singulair 10 mg p.o. daily. 13.  Pantoprazole 40 mg p.o. daily. 14.  Prednisone 10 mg 5 tablets once a day for 2 days and then taper.  15.  ProAir HFA CFC 90 mcg inhalation 2 puffs inhaled 4 times a day p.r.n.  16.  Bactrim 800 mg/160 mg p.o. tablets b.i.d.  17.  Symbicort 80 mcg/4.5 mcg inhalation 2 puffs inhaled b.i.d. 18.  Theo-24, 100 mg per 24-hour capsule one cap once a day.  19.  Tylenol 500 mg p.o. b.i.d.   REVIEW OF SYSTEMS: CONSTITUTIONAL:  The patient denies any fever or chills. No headache or dizziness. No weakness.  EYES:  No double vision or blurry vision.  EARS, NOSE, AND THROAT:  No postnasal drip, slurred speech, or dysphagia.  CARDIOVASCULAR:  No chest pain, palpitations, orthopnea, or nocturnal dyspnea. No leg edema.  Pulmonary:  Positive for cough, sputum, shortness of breath, and severe wheezing. No hemoptysis.  GASTROINTESTINAL:  No abdominal pain, nausea, vomiting, or diarrhea. No melena or bloody stool.  GENITOURINARY:  No dysuria, hematuria, or incontinence.  SKIN:  No rash or jaundice.  NEUROLOGIC:  No syncope, loss of  consciousness, or seizure.  ENDOCRINOLOGIC:  No polyuria, polydipsia, or heat or cold intolerance.  HEMATOLOGIC:  No easy bruising or bleeding.   PHYSICAL EXAMINATION: VITAL SIGNS:  Temperature 97.7, blood pressure 118/77, pulse 87, oxygen saturation increased to 99% on BiPAP.  GENERAL:  The patient is alert, awake, oriented, and in no acute distress.  HEENT:  Pupils are round, equal, and reactive to light and accommodation. Moist oral mucosa. Clear oropharynx.  NECK:  Supple. No JVD or carotid bruit. No lymphadenopathy. No thyromegaly.  CARDIOVASCULAR:  S1 and S2, regular rate and rhythm. No murmurs.  PULMONARY:  Bilateral air entry. No  wheezing or rales. Bilateral severe expiratory wheezing and crackles. Mild use of accessory muscles to breathe.  ABDOMEN:  Soft. No distention or tenderness. No organomegaly. Bowel sounds are present.  EXTREMITIES:  No edema, clubbing, or cyanosis. No calf tenderness. Bilateral pedal pulses are present.  SKIN:  No rash or jaundice.  NEUROLOGIC:  Alert and oriented x 3. No focal deficit. Power is 5/5. Sensation is intact.   LABORATORY AND RADIOGRAPHIC DATA:  Chest x-ray did not show any acute findings, chronic changes suggestive of COPD.   WBC is 20.4, hemoglobin 13.6, and platelets are 405,000. Glucose is 119, BUN 27, creatinine 1.05, and electrolytes are normal.   EKG shows sinus rhythm with short PR with premature supraventricular complexes at 94 bpm.   IMPRESSIONS:  1.  Acute-on-chronic respiratory failure.  2.  Chronic obstructive pulmonary disease exacerbation.  3.  Leukocytosis, possibly due to steroid use.  4.  Dehydration.  5.  Coronary artery disease.  6.  Tobacco abuse.   PLAN OF TREATMENT: 1.  The patient will be admitted to the medical floor. We will start Solu-Medrol IVPB and DuoNeb q. 4 hours around-the-clock. Continue BiPAP, try to wean off BiPAP, and then change to oxygen by nasal cannula.  2.  Continue the patient's Symbicort and Theo-24.  Continue Bactrim.  3.  For coronary artery disease, continue Plavix and statin.  4.  For tobacco abuse, smoking cessation. She was counseled for 3 minutes.   CODE STATUS:  Discussed the patient's condition and plan of treatment with the patient. The patient wants full code.   TIME SPENT:  About 56 minutes.     ____________________________ Shaune PollackQing Philmore Lepore, MD qc:nb D: 12/04/2013 21:20:19 ET T: 12/04/2013 22:01:10 ET JOB#: [id number]  cc: Shaune PollackQing Chaniece Barbato, MD, <Dictator> Shaune PollackQING Ramesha Poster MD ELECTRONICALLY SIGNED 12/05/2013 16:55

## 2014-05-05 NOTE — Consult Note (Signed)
   Comments   Josh Borders, NP, and I met with pt. She is adamant that she is returning home at discharge. We suggested involvement of hospice in the home and pt agreed with this. We discussed code status with pt and pt wants to be a DNR. Order entered. Out-of-facility DNR completed and placed in chart.  then spoke with pt's daughters and informed them of discussion with pt. Though they feel that pt needs SNF, they understand that this is her decision and they are willing for her to try home with hospice. They are also in agreement with DNR.   Electronic Signatures: Keyari Kleeman, Izora Gala (MD)  (Signed 04-Feb-15 17:24)  Authored: Palliative Care   Last Updated: 04-Feb-15 17:24 by Irfan Veal, Izora Gala (MD)

## 2014-05-05 NOTE — Consult Note (Signed)
   General Aspect Patient resting quietly in CCU bed, in no acute distress with O2 at 2 L/m nasal cannula, with daughter at bedside.   Present Illness 79 year old white female with severe COPD, who presented to the emergency department, following increased and worsening episodes of shortness of breath at home with O2 saturation down to 74.  She has been stabilized with the use of supplemental oxygen, steroids, and bronchodilators.  Upon further questioning, the patient and her daughter describe the patient having increasing episodes of "chest tightness", particularly with any physical activity and decreasing O2 saturation levels.  The chest discomfort is relieved by rest and normalization of O2 saturation levels. EKG on admission shows sinus rhythm with questionable inferior MI and nonspecific T-wave abnormalities in the lateral leads. Serial troponin levels are pending. Chest x-ray shows mild CHF.  Past history: 1.  Hypertension 2.  Hyperlipidemia 3.  Hypothyroidism 4.  Prior tobacco abuse 5.  COPD with chronic O2 use  Social history: The patient is a widow and has 2 grown daughters.  She states that she quit smoking several months ago, but she has a 60 pack year  history of tobacco abuse.  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   Family history: The patient's mother died at age 77 from complications related to Alzheimer's disease.  The patient's father died in his early 5680s with CHF.  She has one brother who has suffered multiple myocardial infarctions.   Physical Exam:  GEN no acute distress   HEENT PERRL, Slightly  hard of hearing   NECK supple  No masses   RESP normal resp effort  crackles   CARD Regular rate and rhythm  Normal, S1, S2   ABD denies tenderness  soft  normal BS   EXTR negative cyanosis/clubbing, negative edema   NEURO cranial nerves intact   PSYCH alert, A+O to time, place, person    Review of Systems:  Subjective/Chief Complaint chest pain and dyspnea   Respiratory: Short of breath   Cardiovascular: Tightness   Neurologic: No Complaints   Review of Systems: All other systems were reviewed and found to be negative   EKG:  EKG NSR   Interpretation with questionable inferior MI and nonspecific ST-T wave changes in the lateral leads.    Codeine: N/V  Spiriva: Rash   Impression 79 year old female with acute on chronic hypoxic respiratory failure secondary to COPD, acute on chronic systolic CHF, hypertension, hyperlipidemia, and prior tobacco abuse, with abnormal EKG and stable anginal equivalent, need further assessment of coronary anatomy.   Plan continue supplemental oxygen as well as medical therapy.  Patient and her daughter were counseled on the risks and benefits of cardiac catheterization and they verbalize understanding.  Patient to be n.p.o. after midnight with plans for cardiac catheterization tomorrow to further define her coronary anatomy.   Electronic Signatures: Olin PiaMelville, Bonnie J (NP)  (Signed 23-Feb-15 14:09)  Authored: General Aspect/Present Illness, History and Physical Exam, Review of System, EKG , Allergies, Impression/Plan   Last Updated: 23-Feb-15 14:09 by Olin PiaMelville, Bonnie J (NP)

## 2014-05-05 NOTE — H&P (Signed)
PATIENT NAME:  Samantha Goodman, Samantha Goodman MR#:  [id number] DATE OF BIRTH:  16-Nov-1935  DATE OF ADMISSION:  08/29/2013  PRIMARY CARE PHYSICIAN: Leo Grosser, MD   CHIEF COMPLAINT: Shortness of breath and wheezing.   HISTORY OF PRESENT ILLNESS:  A 79 year old female with a history of COPD with chronic respiratory failure, on 2 liters of oxygen, CAD, status post 1 stent, who presents with the above complaint. Over the past week, the patient has had increasing shortness of breath, PND, dyspnea on exertion, wheezing, and a cough with white sputum. She saw her primary care physician yesterday and was given a prescription for prednisone and given a shot of possible antibiotics or IV steroids. She presents with worsening symptoms. In the ER, she was given nebulizers and IV steroids but continued to have some mild wheezing.  REVIEW OF SYSTEMS:  CONSTITUTIONAL: No fever. Positive fatigue and weakness.  EYES: No blurry or double vision or glaucoma. ENT: No ear pain, hearing loss. Positive snoring.  RESPIRATORY: Positive cough, positive COPD with chronic history of failure. Positive dyspnea.  CARDIOVASCULAR:  No chest pain, orthopnea, edema, arrhythmia, palpitations or syncope.  GASTROINTESTINAL: No nausea, vomiting, diarrhea, abdominal pain, melena, or ulcers. GENITOURINARY:  No dysuria or hematuria.  ENDOCRINE: No polyuria or polydipsia.  HEMATOLOGY/LYMPHATICS: Positive easy bruising. SKIN:  No rash or lesions. MUSCULOSKELETAL: No redness or gout.  NEUROLOGIC: No history of CVA, TIA, or seizures. PSYCHIATRIC: No history of anxiety or depression.  PAST MEDICAL HISTORY: 1.  COPD with chronic respiratory failure, on 2 liters of oxygen.  2.  Hypothyroidism. 3.  Coronary artery disease, status post a stent.   PAST SURGICAL HISTORY: Implants in both her right and left eyes.    ALLERGIES: CODEINE AND SPIRIVA.   MEDICATIONS: 1.  Theophylline 100 mg daily.  2.  Symbicort 2 puffs b.i.d.  3.  ProAir HFA 2 puffs  q. 4 hours. p.r.n.  4.  Prednisone taper. 5.  Pantoprazole 40 mg daily.  6.  Mucus Relief daily.  7.  Montelukast 10 mg daily. 8.  Metoprolol 25 daily.  9.  Synthroid 88 mcg daily.  10. Lasix 40 mg daily.  11. Ferrous sulfate 325 t.i.d.  12. Plavix 75 mg daily.  13. Citalopram 20 mg daily.  14. Atorvastatin 40 mg daily.  15. Aspirin 81 mg daily.   SOCIAL HISTORY: The patient continues to smoke 1/2 pack a day. No alcohol or IV drug use.   FAMILY HISTORY: Positive for CAD.   PHYSICAL EXAMINATION:  VITAL SIGNS: Temperature is afebrile. Pulse is 71, respirations are 26, blood pressure 116/62, and 95% on 3 liters.  GENERAL: The patient is alert and oriented, does not appear to be in acute distress.  HEENT:  Head is atraumatic. Pupils are round. Sclerae anicteric. Mucous membranes are moist. Oropharynx is clear.  NECK: Supple without JVD, carotid bruit, or enlarged thyroid.  CARDIOVASCULAR: Regular rate and rhythm. No murmurs, gallops, or rubs.  PMI is not displaced. LUNGS:  She has got good air movement with a prolonged expiration and wheezing, right greater than the left. No crackles or rales are heard.  BACK:  No CVA or vertebral tenderness.  ABDOMEN:  Bowel sounds are positive.  Nontender, nondistended. No hepatosplenomegaly.   EXTREMITIES: No clubbing, cyanosis, or edema. SKIN:  She has some bruises, but no other rashes are noted.  NEUROLOGIC: Cranial nerves II through XII are intact. No focal deficits.   LABORATORIES: White blood cells 15.6, hemoglobin 12.4, hematocrit 39, platelets are 343,000.  Sodium 140, potassium 3.0, chloride 95, bicarbonate 36, BUN 13, creatinine 0.93, glucose 117, ALT 15, AST 28, total protein 8.1, albumin 3.1. Theophylline level is 2.7. Chest x-ray shows no acute cardiopulmonary disease.   ASSESSMENT AND PLAN: 1.   This is a 79 year old female who presents with acute exacerbation of  chronic obstructive pulmonary disease. 2.  Acute-on-chronic respiratory  failure from acute exacerbation of chronic obstructive pulmonary disease, as outlined below. 3.  Acute chronic obstructive pulmonary disease exacerbation.  The patient received some prednisone yesterday with some either antibiotics or IM steroid. She presents today with worsening shortness of breath and wheezing. She will be admitted for COPD exacerbation. For her COPD, we will continue her outpatient oxygen as well as inhalers.  I have started Solu-Medrol, DuoNebs q. 4 hours and will monitor closely.  4.  Hypokalemia, which will be repleted. We will go ahead and check a magnesium level as well.  5.  History of coronary artery disease. We will continue outpatient medications including aspirin, Plavix, statin, and beta blocker.  6.  Hypothyroidism. We will continue levothyroxine.   The patient is a DNR status.    TIME SPENT: Approximately 40 minutes.    ____________________________ Janyth ContesSital P. Juliene PinaMody, MD spm:DT D: 08/29/2013 13:28:24 ET T: 08/29/2013 14:04:37 ET JOB#: [id number]  cc: Jamirah Zelaya P. Juliene PinaMody, MD, <Dictator> [name], MD Janyth ContesSITAL P Neida Ellegood MD ELECTRONICALLY SIGNED 08/29/2013 17:32

## 2014-05-05 NOTE — Discharge Summary (Signed)
PATIENT NAME:  Samantha Goodman, Samantha Goodman MR#:  [id number] DATE OF BIRTH:  21-Mar-1935  DATE OF ADMISSION:  12/04/2013 DATE OF DISCHARGE: 12/06/2013  PRESENTING COMPLAINT: Shortness of breath.   DISCHARGE DIAGNOSES: 1. Acute on chronic respiratory failure with hypoxia.  2. Acute chronic obstructive pulmonary disease exacerbation.  3. Ongoing tobacco abuse.  4. Generalized anxiety disorder.  5. Coronary artery disease status post percutaneous coronary intervention with stent placement.  6. Hypothyroidism.   CONSULTATIONS: Dr. Meredeth Ide, pulmonology.   PROCEDURES: Chest x-ray, November 23, shows no acute findings. Chronic changes suggestive of COPD.   HISTORY OF PRESENT ILLNESS: This 79 year old, Caucasian female, with history of COPD with chronic respiratory failure on oxygen at home presents to the Emergency Room with shortness of breath and wheezing. The patient had been discharged from the hospital 4 hours prior to presentation. At the time of presentation, she is alert, awake, oriented and in no acute distress. She did require a short period of BiPAP in the Emergency Room. When she arrived home, she had shortness of breath and wheezing with cough and sputum, so returned to the Emergency Room. Her family suspects that she went home and smoked cigarettes, which triggered another COPD exacerbation. The patient's oxygen saturation decreased to the 80s in the Emergency Room. She was placed on BiPAP briefly and treated with a nebulizer, after which oxygen saturations improved.   HOSPITAL COURSE BY PROBLEM:  1. Acute on chronic respiratory failure due to acute COPD exacerbation: The patient was treated with BiPAP briefly in the Emergency Room and received a nebulizer treatment therapy right after that. Her oxygenation improved and she resumed nasal cannula at 3 liters. She was treated with Solu-Medrol, DuoNeb treatments, and continued Bactrim. This was changed to Levaquin during the hospitalization.  2. Acute COPD  exacerbation likely triggered by tobacco exposure: The patient had just been discharged 4 hours prior to admission for treatment of the same. She was treated with standard therapy of nebulizers, steroids and antibiotics. Dr. Meredeth Ide was consulted for improved management of COPD and medications were changed as below. She is advised to quit smoking.  3. Ongoing tobacco abuse: The family suspects that the patient smoked cigarettes when she arrived home, which triggered a recurrence of her COPD exacerbation. She is advised to quit smoking. Counseling was provided on admission and daily while in the hospital.  4. Generalized anxiety disorder: The patient does report extreme anxiety, especially when short of breath due to COPD exacerbation. She was started on an SSRI and low-dose benzo as needed at her last hospitalization, and this is continued.  5. Coronary artery disease status post PCI with stent placement: The patient had no chest pain or other anginal equivalent other than shortness of breath during her hospitalization.   DISCHARGE MEDICATIONS:  1. ProAir HFA CFC-free 90 mcg/inhalation inhaler 2 puffs 4 times a day as needed.  2. Clopidogrel 75 mg 1 tablet once a day.  3. Montelukast 10 mg 1 tablet once a day.  4. Tylenol 500 mg 1 tablet 2 times a day.  5. Citalopram 20 mg 1 tablet once a day.  6. Levothyroxine 88 mcg 1 tablet once a day.  7. Aspirin 81 mg 1 tablet once a day.  8. Loratadine 10 mg 1 tablet once a day.  9. Ferrous sulfate 325 mg 1 tablet once a day.  10. Pantoprazole 40 mg 1 tablet once a day.  11. Metoprolol succinate 25 mg 0.5 tablet orally once a day in the morning.  12.  Furosemide 20 mg 2 tablets once a day in the morning.  13. Prednisone 10 mg oral tablet start with 5 tablets and then decrease by 1 tablet every 2 days and then stop.  14. Atorvastatin 40 mg 1 tablet once a day.  15. Lorazepam 0.5 mg 1 tablet once every 6 hours as needed for anxiety.  16. Theophylline 200  mg/24 hour oral tablet 1 capsule twice a day.  17. Symbicort 160 mcg/4.5 mcg inhalation aerosol 2 puffs inhaled twice a day.  18. Amoxicillin clavulanate 875 mg/125 mg 1 tablet every 12 hours.  19. Albuterol/ipratropium 2.5 mg/0.5 mg/3 mL inhalation solution inhaled 4 times a day scheduled until seen by Dr. Meredeth IdeFleming.   DISCHARGE PHYSICAL EXAMINATION:  VITAL SIGNS: Temperature 98.7, pulse 76, respirations 19, blood pressure 117/66, oxygenation 91% on 3 liters.  GENERAL: No acute distress.  RESPIRATORY: Scattered wheezes, good air movement, no respiratory distress.  CARDIOVASCULAR: Distant, regular rate and rhythm. No murmurs, rubs or gallops. No edema. Peripheral pulses are 1+.  PSYCHIATRIC: The patient is alert and oriented x 4. She does exhibit some anxiety, but this does not seem uncontrolled. She has good insight into her clinical condition.   LABORATORY DATA: Sodium 143, potassium 4.5, chloride 104, bicarbonate 33, BUN 29, creatinine 0.91, glucose 147. LFTs normal. Troponin less than 0.02. Theophylline level was low at less than 2. White blood cells 13.8, hemoglobin 12.7, platelets 337,000. MCV 82.   CONDITION ON DISCHARGE: Stable.   DISPOSITION: The patient is discharged home with home health nursing and nurse's aide.   DISCHARGE INSTRUCTIONS:  DIET: Low-sodium, low-fat, low-cholesterol diet.   ACTIVITY LIMITATIONS: No driving.   TIMEFRAME FOR FOLLOW-UP: Please follow up with Dr. Meredeth IdeFleming within 1 to 2 weeks of discharge. Please follow up with your primary care physician within 1 to 2 weeks of discharge.  TIME SPENT ON DISCHARGE: 40 minutes.    ____________________________ Ena Dawleyatherine P. Clent RidgesWalsh, MD cpw:JT D: 12/11/2013 10:19:29 ET T: 12/11/2013 11:16:11 ET JOB#: [id number]  cc: Santina Evansatherine P. Clent RidgesWalsh, MD, <Dictator> Herbon E. Meredeth IdeFleming, MD [name], MD Gale JourneyATHERINE P Pardeep Pautz MD ELECTRONICALLY SIGNED 12/16/2013 11:51

## 2014-05-05 NOTE — H&P (Signed)
PATIENT NAME:  Samantha Goodman, Danaja L MR#:  [id number] DATE OF BIRTH:  Apr 30, 1935  PRIMARY CARE PHYSICIAN:  [name], MD  REFERRING EMERGENCY ROOM PHYSICIAN:  Randon GoldsmithRebecca L. Lord, MD  CHIEF COMPLAINT: Shortness of breath.   HISTORY OF PRESENT ILLNESS: The patient is a 79 year old Caucasian female with a past medical history of chronic respiratory failure, lives on 2 liters of oxygen, COPD, still continues to smoke, is presenting to the ED with a chief complaint of worsening of shortness of breath and wheezing. The patient came into the ED as she could not breathe. Although she was in 2 liters of oxygen, her oxygen saturation dropped down to 80. She increased her oxygen to 2.5 liters with no significant improvement. The patient came into the ED, has received nebulizer treatments  with no significant improvement. Chest x-ray is done which has revealed atelectasis and small effusion. No acute infiltrates. The patient was started on Solu-Medrol 125 mg IV and hospitalist team is called to admit the patient.   During my examination, the patient is still complaining of tightness, but feeling better. Not using any accessory muscles. Her daughter is at bedside. Daughter is concerned that the patient still continues to smoke. The patient denies any chest pain.  Denies any fever. No sick contacts. No other complaints.   PAST MEDICAL HISTORY: Chronic history of COPD, chronic hypoxic respiratory failure, lives on 2 liters of oxygen, coronary artery disease, status post stent placement, hypothyroidism.   PAST SURGICAL HISTORY: Right and left thigh implant, coronary artery disease, status post stent.   ALLERGIES: CODEINE AND SPIRIVA.  PSYCHOSOCIAL HISTORY: Lives at home, lives alone, smokes half pack a day. Denies any alcohol or illicit drug usage.   FAMILY HISTORY: Coronary artery disease runs in her family.  MEDICATIONS: Albuterol 2.5 mg inhalation 3 times a day, aspirin 81 mg p.o. once daily, atorvastatin 40 mg  p.o. at bedtime, citalopram 20 mg p.o. once daily in a.m., Plavix 75 mg p.o. once daily at bedtime, iron sulfate 325 mg p.o. 3 times a day, furosemide 40 mg p.o. once daily in a.m., levothyroxine 88 mcg p.o. once daily, metoprolol succinate 25 mg p.o. once a day in a.m., Singulair 10 mg p.o. once daily, Mucinex 600 mg p.o. extended release every 12 hours, Pantoprazole 40 mg p.o. once daily, ProAir 2 puffs inhalation 4 times a day as needed for shortness of breath or wheezing, Symbicort 2 puff inhalation 2 times a day, theophylline 100 mg extended release once daily.   REVIEW OF SYSTEMS: CONSTITUTIONAL: Denies any fever. Complaining of fatigue and weakness.  EYES: Denies blurry vision, double vision.  EARS, NOSE AND THROAT: Denies epistaxis, discharge.  RESPIRATORY:  Complaining of cough, COPD.  Lives on 2 liters of oxygen. CARDIOVASCULAR: No chest pain or palpitations.  GASTROINTESTINAL: Denies nausea, vomiting, diarrhea, abdominal pain, hematemesis, no melena.  GYNECOLOGIC AND BREASTS: Denies breast mass or vaginal discharge.  ENDOCRINE: Denies polyuria, nocturia. Has chronic history of hypothyroidism.  HEMATOLOGIC AND LYMPHATIC:   No anemia, easy bruising, or bleeding.  INTEGUMENTARY: No acne, rash, lesions.  MUSCULOSKELETAL: No joint pain in the neck and back.  NEUROLOGIC:  Denies vertigo, ataxia.  PSYCHIATRIC: No ADD or OCD.   PHYSICAL EXAMINATION:  VITAL SIGNS:  Temperature is 97.4, pulse 80-97, respirations 24-27, blood pressure 116/46. Pulse oximetry is 93%. She was at 87% on 2 liters initially.  GENERAL APPEARANCE:  Not in any acute distress. Moderately built, well nourished. HEENT:  Normocephalic, atraumatic. Pupils are equally reacting to  light and accommodation. No scleral icterus. No conjunctival injection. No sinus tenderness. Moist mucous membranes.  NECK: Supple. No JVD. No thyromegaly. Range of motion is intact.  LUNGS: Moderate air entry.  Minimal end-expiratory wheezing is  present.  No accessory muscle use.  No anterior chest wall tenderness on palpation.  CARDIAC: S1 and S2 normal. Regular rate and rhythm. No murmurs. GASTROINTESTINAL: Soft. Bowel sounds are positive in all 4 quadrants. Nontender, nondistended. No hepatosplenomegaly. No masses felt.  NEUROLOGIC: Awake, alert, and oriented. Cranial nerves II-XII are grossly intact. Motor and sensory are intact. Reflexes are 2+.  EXTREMITIES: No edema. No cyanosis. No clubbing.  SKIN: Warm to touch. Normal turgor. No rashes. No lesions.  MUSCULOSKELETAL: No joint effusion, tenderness, erythema.  PSYCHIATRIC: Normal mood and affect.   LABORATORY AND IMAGING STUDIES:  Chest x-ray PA and lateral views: Recurrent slight atelectasis and small effusion at the left lung base, emphysema. A 12-lead EKG, normal sinus rhythm at 90 beats per minute. Normal PR interval, PVCs are present, nonspecific ST-T wave changes. Troponin less than 0.02. CBC is normal. BMP, potassium at 2.6, glucose 103, anion gap 6.  Calcium and osmolality are normal.   ASSESSMENT AND PLAN: A 79 year old female presenting to the Emergency Department  with shortness of breath and chest tightness. Has chronic history of chronic obstructive pulmonary disease, still continues to smoke, lives on 2 liters of oxygen. Chest x-ray revealed no acute infiltrates.   1.  Acute on chronic hypoxic respiratory failure from chronic obstructive pulmonary disease exacerbation. We will admit the patient and provide her oxygen to maintain pulse oximetry at around 90%.  2.  Acute exacerbation of chronic obstructive pulmonary disease. Still continues to smoke. The patient was given Solu-Medrol 125 mg intravenous in the Emergency Department.  Will continue Solu-Medrol 60 mg intravenous q. 6 hours. DuoNeb nebulizer treatments q. 4 hours. Albuterol on an as needed basis. The patient will be on intravenous Rocephin for antibiotic coverage. Will continue her home medication theophylline and  check theophylline  levels.  3.  History of coronary artery disease, status post stent, currently asymptomatic. We will provide her aspirin and resume her home medication.  4.  Hypothyroidism.  Continue Synthroid.  5.  Hypokalemia.  No acute EKG changes. Will replace potassium both p.o. and intravenous and check her magnesium. Repeat BMP in the a.m.  6.  Will provide her gastrointestinal and deep vein thrombosis prophylaxis.  7.  Nicotine abuse.  The patient was provided with nicotine cessation counseling for 3-5 minutes.  She verbalized understanding.  She said that she is slowly cutting and agreed to use nicotine patch.    Plan of care discussed with the patient and daughter at bedside. They both verbalized understanding of the plan.   TOTAL TIME SPENT ON ADMISSION: 50 minutes.   ____________________________ Ramonita Lab, MD ag:LT D: 11/29/2013 17:47:16 ET T: 11/29/2013 18:34:16 ET JOB#: [id number]  cc: Ramonita Lab, MD, <Dictator> Leo Grosser, MD Ramonita Lab MD ELECTRONICALLY SIGNED 12/08/2013 18:12

## 2014-05-05 NOTE — Discharge Summary (Signed)
PATIENT NAME:  Samantha Goodman, Samantha Goodman MR#:  [id number] DATE OF BIRTH:  [birth year]  DATE OF ADMISSION:  02/08/2013 DATE OF DISCHARGE:  02/23/2013  DISCHARGE DIAGNOSES: 1.  Acute on chronic respiratory failure due to chronic obstructive pulmonary disease exacerbation.  2.  On 2 liters oxygen. Patient on BiPAP at home.  3.  Hyperlipidemia.  4.  Hypothyroidism.   CONDITION ON DISCHARGE: Stable.   CODE STATUS. Do not resuscitate.  DISCHARGE MEDICATIONS:  1.  Pravastatin 40 mg once a day.  2.  ProAir 2 puffs inhalation every 4 to 6 hours as needed.  3.  Montelukast 10 mg oral tablet once a day.  4.  DuoNeb 1 vial via nebulizer every 4 hours, 3 times a day.  5.  Levothyroxine 50 mcg once a day.  6.  Theophylline 24 hour 100 mg once a day.  7.  Prednisone 10 mg, start at 60 mg and taper by 10 mg daily until complete.  8.  Citalopram 20 oral once a day.  9.  Alprazolam 0.25 mg oral tablet 2 times a day as needed for anxiety.  10.  Glycopyrrolate 1 mg oral tablet every 6 hours as needed for cough.    HOME HEALTH ON DISCHARGE: Yes. Advised to have physical therapy and nurse on discharge.  TREATMENT ON DISCHARGE: BiPAP.   HOME OXYGEN ON DISCHARGE: Yes. Advised to have 2 liters nasal cannula oxygen at home.   ACTIVITY: As tolerated.   TIMEFRAME TO FOLLOW-UP: Within 1 to 2 weeks.   HOSPITAL COURSE AND STAY: For earlier admission and hospital course, please see interim discharge summary done on 6 February. Hospital course after that: The patient had significant improvement in her overall condition. Physical therapy consult was done again, and they suggested that patient can be discharged at home with home health. Her chronic obstructive pulmonary disease was under control. She was still requiring 2 liters oxygen, and we discharged her on that.  TIME SPENT ON THIS DISCHARGE: 40 minutes.    ____________________________ Hope PigeonVaibhavkumar G. Elisabeth PigeonVachhani, MD vgv:cg D: 02/27/2013 21:42:18 ET T: 02/27/2013  23:13:12 ET JOB#: [id number]  cc: Hope PigeonVaibhavkumar G. Elisabeth PigeonVachhani, MD, <Dictator> [name], MD Heath GoldVAIBHAVKUMAR [facility] Center[facility]ACHHANI MD ELECTRONICALLY SIGNED 03/12/2013 0:47

## 2014-05-05 NOTE — H&P (Signed)
PATIENT NAME:  Samantha Goodman, Samantha Goodman MR#:  [id number] DATE OF BIRTH:  November 09, 1935  DATE OF ADMISSION:  03/06/2013  PRIMARY CARE PHYSICIAN: Dr. Lorie Phenix.   REFERRING PHYSICIAN: Dr. Lenard Lance.   CHIEF COMPLAINT: Shortness of breath and chest tightness.   HISTORY OF PRESENT ILLNESS: The patient is a 79 year old, pleasant, Caucasian female with past medical history of hypertension, hypothyroidism, chronic hypoxic respiratory failure, lives on 2 liters of oxygen, COPD. Presenting to the ER with a chief complaint of shortness of breath associated with chest tightness since yesterday evening. The patient is reporting that suddenly at around 8:00, she felt tight in her chest and extremely short of breath. She could not breathe even on 2 liters of oxygen. Family members called EMS, and according to EMS, the patient was satting at 80% on 4 liters of oxygen. She went up to 86% after DuoNeb neb treatment. Solu-Medrol 125 mg IV was given by the EMS, and the patient was brought into the ER. In the ER, chest x-ray revealed mild CHF. The patient was given DuoNeb neb treatment and eventually, she was placed on a BiPAP machine with FiO2 of 75%. On BiPAP machine, the patient is feeling better but still feeling tight in her chest during my examination. Daughter and grandson-in-law are at bedside. The patient has a long history of smoking, and she admits that she started smoking at age 46 and she has been smoking until 3 weeks ago. 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 The patient had several admissions in the past 3 to 4 months with a similar complaint of shortness of breath. Recently, she was admitted on 02/08/2013 with a similar complaint of shortness of breath and diagnosed with acute exacerbation of COPD. The patient was just discharged home on February 14th. Denies any chest pain, abdominal pain during my examination.   PAST MEDICAL HISTORY:  Hypothyroidism, hyperlipidemia and chronic history of COPD, lives on 2 liters of oxygen.   PAST SURGICAL HISTORY: Eye surgery.   ALLERGIES: CODEINE AND SPIRIVA.   PSYCHOSOCIAL HISTORY: Lives at home with grandson-in-law. She started smoking at age 35. She smoked until 3 weeks ago. She used to smoke 1 pack a day. Denies alcohol or illicit drug usage.   FAMILY HISTORY: Hypertension, coronary artery disease runs in her family.   HOME MEDICATIONS: Ventolin 2 puffs inhalation q.4 to 6 hours as needed, theophylline 100 mg extended release 1 capsule p.o. once daily, Symbicort 80 mcg/4.5 two puffs inhalation 2 times a day, prednisone  tapering dose per steroid tapering pack, Prilosec 40 mg p.o. once daily, montelukast 10 mg p.o. once daily, levothyroxine 50 mcg p.o. once daily, DuoNeb treatments every 4 hours as needed for wheezing and shortness of breath, Celexa 20 mg p.o. once daily, alprazolam 0.25 mg p.o. 2 times a day.   REVIEW OF SYSTEMS:  CONSTITUTIONAL: Denies any fever. Denies fatigue.  EYES: Denies blurry vision, double vision.  ENT: Denies epistaxis, discharge.  RESPIRATION: Complaining of chronic COPD. Lives on 2 liters of oxygen. Complaining of dyspnea on exertion and shortness of breath.  CARDIOVASCULAR: Complaining of chest tightness. No palpitations.  GASTROINTESTINAL: Denies nausea, vomiting, diarrhea.  GENITOURINARY: No dysuria or hematuria.  GYNECOLOGIC AND BREASTS: Denies breast mass or vaginal discharge.  ENDOCRINE: Denies polyuria, nocturia. Has hypothyroidism.  HEMATOLOGIC AND LYMPHATIC: No anemia, easy bruising, bleeding.  INTEGUMENTARY: No acne, rash, lesions.  MUSCULOSKELETAL: No joint pain in the neck and back. Denies gout.  NEUROLOGICAL:  No vertigo ataxia.  PSYCHIATRIC: No ADD, OCD.   PHYSICAL EXAMINATION:  VITAL SIGNS: Temperature 98.1, pulse 88, respirations 20, blood pressure 115/60, pulse ox 98% on BiPAP.  GENERAL APPEARANCE: Not in acute distress. Moderately  built and nourished.  HEENT: Normocephalic, atraumatic. Pupils are equally reacting to light and accommodation. No scleral icterus. No conjunctival injection. No sinus tenderness. No postnasal drip.  NECK: Supple. No JVD. No thyromegaly.  LUNGS: Moderate air entry. Bronchial breath sounds. Rales and rhonchi.  CARDIAC: S1, S2 normal. Regular rate and rhythm. No murmurs.  GASTROINTESTINAL: Soft. Bowel sounds are positive in all 4 quadrants. Nontender, nondistended. No hepatosplenomegaly. No masses felt.  NEUROLOGIC: Awake, alert and oriented x 3. Motor and sensory grossly intact. Reflexes are 2+.  EXTREMITIES: No edema. No cyanosis. No clubbing.  SKIN: Warm to touch. Normal turgor. No rashes. No lesions.  MUSCULOSKELETAL: No joint effusion, tenderness, erythema.   PSYCHIATRIC: Normal mood and affect.   LABORATORIES AND IMAGING STUDIES: Chest x-ray, portable: Mild CHF and stable cardiomegaly. New mild interstitial pulmonary edema. Stable bilateral pleural effusions. A 12-lead EKG. Nonspecific ST-T wave changes. LFTs are normal. First set of cardiac enzymes are normal. CBC is normal. On FiO2 of 75%, pH is 7.50, pCO2 44, pO2 61, base excess 9.9 and bicarb is 34.3. BNP is 310. Glucose 88, BUN 21, creatinine 0.86. Anion gap is 3. The rest of the chem-8 is normal.   ASSESSMENT AND PLAN: A 79 year old female presenting to the Emergency Room with a chief complaint of shortness of breath. Started yesterday, associated with chest tightness. Will be admitted with the following assessment and plan:   DICTATION ENDS HERE   ____________________________ Ramonita LabAruna Lyndel Dancel, MD ag:gb D: 03/06/2013 00:25:17 ET T: 03/06/2013 03:22:10 ET JOB#: [id number]  cc: Ramonita LabAruna Gunnard Dorrance, MD, <Dictator> Ramonita LabARUNA Annaliyah Willig MD ELECTRONICALLY SIGNED 03/18/2013 5:37

## 2014-05-06 NOTE — H&P (Signed)
PATIENT NAME:  Samantha Goodman, Samantha Goodman MR#:  [id number] DATE OF BIRTH:  1935-10-10  DATE OF ADMISSION:  05/27/2011  ADMITTING PHYSICIAN: Enid Baasadhika Azavier Creson, MD   PRIMARY MD: Lorie PhenixNancy Maloney, MD   CHIEF COMPLAINT: Difficulty breathing.   HISTORY OF PRESENT ILLNESS: [name] is a 79 year old Caucasian female with past medical history significant for chronic respiratory failure secondary to COPD on 2 liters home oxygen, ongoing smoking, and hypothyroidism who comes in from PCP's office secondary to worsening respiratory status since last night. The patient says she has been having some cold symptoms for the last couple of days without any dyspnea on exertion but last night she started to have difficulty breathing. She tried using her nebs at home this morning, did not improve so has seen her primary care physician. She had extensive wheezing on examination over there so she received a dose of Solu-Medrol neb treatments without improvement and was sent to the ED. In the ED even after one other dose of nebs she has been still wheezing and having dyspnea at rest so she is being admitted for COPD exacerbation. The patient denies any chest pain. Has been having cough with clear mucoid phlegm. Denies any fevers or chills.   PAST MEDICAL HISTORY:  1. Chronic respiratory failure secondary to COPD, on 2 liters home oxygen.  2. Hypothyroidism.  3. Hyperlipidemia.  4. Osteoporosis.  5. Tobacco use disorder.   PAST SURGICAL HISTORY:  1. Cataract surgery.  2. Left ankle surgery after a fall.  3. Appendectomy.  4. Hysterectomy.   ALLERGIES TO MEDICATIONS: Codeine and Spiriva. She has a rash with Spiriva.   MEDICATIONS AT HOME: 1. Pravachol 40 mg p.o. daily.  2. Synthroid 75 mcg p.o. daily.  3. ProAir inhaler as needed.  4. Advair 500/50 one puff b.i.d.    SOCIAL HISTORY: Lives at home by herself. Relapsed back on smoking, now smokes 2 packs per day over the last couple of weeks. No history of any alcohol or drug  use.   FAMILY HISTORY: Dad was an alcoholic, does not know much about him. Mom had hypertension and CVA.   REVIEW OF SYSTEMS: CONSTITUTIONAL: No fever, fatigue, or weakness. EYES: No blurred vision, double vision, status post cataract surgery. ENT: No tinnitus, ear pain, epistaxis, or hearing loss. RESPIRATORY: Positive for cough, wheeze, COPD. No hemoptysis. CARDIOVASCULAR: No chest pain, orthopnea, edema, arrhythmia, palpitations, or syncope. GI: No nausea, vomiting, abdominal pain, hematemesis, or melena. GU: No dysuria, hematuria, renal calculus, frequency, or incontinence. ENDOCRINE: No polyuria, nocturia, thyroid problems, heat or cold intolerance. HEMATOLOGY: No anemia, easy bruising or bleeding. SKIN: No acne, rash, or lesions. MUSCULOSKELETAL: No neck, back, shoulder pain, arthritis, or gout. NEUROLOGIC: No numbness, weakness, CVA, TIA, or seizures. PSYCHOLOGICAL: No anxiety, insomnia, or depression.   PHYSICAL EXAMINATION:   VITAL SIGNS: Temperature 97.9 degrees Fahrenheit, pulse 100, respirations 26, blood pressure 132/68, pulse oximetry 88% on room air.  GENERAL: Well developed, well nourished female sitting in the bed in mild respiratory distress.   HEENT: Normocephalic, atraumatic. Pupils equal, round, reacting to light. Anicteric sclerae. Extraocular movements intact. Oropharynx clear without erythema, mass, or exudates.   NECK: Supple. No thyromegaly, JVD, or carotid bruits. No lymphadenopathy.   LUNGS: Diffuse scattered wheezing bilaterally more prominent in the bases. Minimal use of accessory muscles especially when moving around in bed or when talking. No crackles.   CARDIOVASCULAR: S1, S2 regular rate and rhythm. No murmurs, rubs, or gallops.   ABDOMEN: Soft, nontender, nondistended. No hepatosplenomegaly. Normal  bowel sounds.   EXTREMITIES: No pedal edema. No clubbing or cyanosis. 2+ dorsalis pedis pulses palpable bilaterally.   SKIN: No acne, rash, or lesions. Poor skin  turgor.   LYMPHATICS: No cervical lymphadenopathy.   NEUROLOGIC: Cranial nerves intact. No focal motor or sensory deficits   PSYCHOLOGICAL: The patient is awake, alert, oriented x3.   LABORATORY, DIAGNOSTIC, AND RADIOLOGICAL DATA: WBC 11.6, hemoglobin 14.4, hematocrit 43.4, platelet count 284, sodium 142, potassium 3.7, chloride 107, bicarb 28, BUN 11, creatinine 0.71, glucose 96, calcium 8.8. CK 145. CK-MB 1.9. Troponin less than 0.02.   Chest x-ray showing clear lung fields. No acute disease of the chest.  EKG showing normal sinus rhythm, heart rate of 91.   ASSESSMENT AND PLAN: This is a 79 year old female with known history of COPD with ongoing smoking sent in from PCP's office for acute COPD exacerbation.  1. Acute on chronic respiratory failure secondary to COPD exacerbation. No evidence of pneumonia or bronchitis on chest x-ray. Will hold off on antibiotics. Start on IV Solu-Medrol 80 mg q.8 hours with albuterol nebs. Will hold off adding Atrovent because she is allergic to Spiriva with rash, the anticholinergic group. No Spiriva because of her allergy. Continue Advair 500/50 and will get an ABG to see if she is retaining CO2. If needed, will start BiPAP. Last admission she did use BiPAP and got better.  2. Hypothyroidism. Continue Synthroid.  3. Hyperlipidemia. On Pravachol. 4. Tobacco use disorder. Recently relapsed back on 2 packs per day smoking. Counseled strongly against it for more than three minutes and will place on nicotine patch while in the hospital.   CODE STATUS: FULL CODE.   TIME SPENT ON DISCHARGE: 50 minutes.   ____________________________ Enid Baas, MD rk:drc D: 05/27/2011 17:44:13 ET T: 05/28/2011 06:51:11 ET JOB#: [id number]  cc: Enid Baas, MD, <Dictator> Leo Grosser, MD Enid Baas MD ELECTRONICALLY SIGNED 05/28/2011 13:07

## 2014-05-06 NOTE — Discharge Summary (Signed)
PATIENT NAME:  Samantha Goodman, Samantha Goodman MR#:  [id number] DATE OF BIRTH:  1935/11/22  DATE OF ADMISSION:  01/11/2011 DATE OF DISCHARGE:  01/14/2011  PRESENTING COMPLAINT: Shortness of breath and cough.   DISCHARGE DIAGNOSES:  1. Acute on chronic respiratory failure secondary to chronic obstructive pulmonary disease flare. 2. Acute bronchitis.   CONDITION ON DISCHARGE: Fair.   FOLLOW-UP: Dr. Elease HashimotoMaloney on 01/31/2011.   DIET: Low sodium diet.   MEDICATIONS ON DISCHARGE:  1. Prednisone taper.  2. Levaquin 500 mg p.o. daily for five more days.  3. Pravastatin 40 mg daily.  4. Omeprazole 20 mg daily.  5. Levothyroxine 50 mcg every other day on Monday, Wednesday, Friday.  6. Albuterol inhaler 2 puffs 4 times a day as needed.  7. Continue nebulizer as before.  8. Protonix 40 mg p.o. daily.  9. Mucinex 600 mg extended-release b.i.d.  10. Advair 500/50, one puff b.i.d.  11. Use oxygen as before.   LABS ON DISCHARGE: His white count 12.7. Chest x-ray: Lung fields clear. Hemoglobin and hematocrit is 13.9 and 41.4, platelet count 259. Basic metabolic panel within normal limits. Sputum: Few white cells, many epithelial cells, many gram-positive cocci. Cardiac enzymes negative. LFTs within normal limits.   CONSULTATION: Pulmonary consultation with Dr. Belia HemanKasa.   BRIEF SUMMARY OF HOSPITAL COURSE: Samantha Goodman is a 79 year old Caucasian female with history of chronic obstructive pulmonary disease on home oxygen, history of hypothyroidism, who comes to the Emergency Room with:  1. Acute chronic obstructive pulmonary disease exacerbation. The patient was started on high dose of IV steroids.  IV Rocephin and Zithromax were given, however, it was changed to p.o. Levaquin. She will finish up a course for a total of eight days. She was kept on bronchodilators and Advair. She also was kept on p.r.n. BiPAP which helped her. She feels at baseline. Her sats are 93% to 94 on 2 liters.  2. Acute bronchitis. The patient will  complete a course of antibiotics.  3. Hyperlipidemia. Zocor was continued.  4. Hypothyroidism. She has been on Synthroid.  5. Tobacco abuse. The patient was counseled strongly on smoking cessation.   Hospital stay otherwise remained stable. The patient remained a FULL CODE.   The patient will be following up with pulmonary in the area after she is seen by Dr. Elease HashimotoMaloney and she will try to get the referral through Dr. Santiago BurMaloney's office.   TIME SPENT: 40 minutes.   ____________________________ Wylie HailSona A. Allena KatzPatel, MD sap:ap D: 01/14/2011 13:45:05 ET T: 01/15/2011 13:47:00 ET JOB#: [id number]  cc: Declynn Lopresti A. Allena KatzPatel, MD, <Dictator> [name], MD Willow OraSONA A Karmon Andis MD ELECTRONICALLY SIGNED 01/15/2011 14:45

## 2014-05-06 NOTE — Discharge Summary (Signed)
PATIENT NAME:  Samantha Goodman, [name] MR#:  [id number] DATE OF BIRTH:  [birth year]  DATE OF ADMISSION:  05/27/2011 DATE OF DISCHARGE:  05/29/2011  ADMISSION DIAGNOSES:  1. Acute on chronic respiratory failure.  2. Chronic obstructive pulmonary disease exacerbation.   DISCHARGE DIAGNOSES:  1. Acute on chronic respiratory failure secondary to chronic obstructive pulmonary disease exacerbation.   2. Chronic obstructive pulmonary disease exacerbation. 3. Hypothyroidism.  4. Hyperlipidemia.  5. Tobacco use disorder.   CONSULTS: None.   LABORATORY DATA AT DISCHARGE: White blood cells 8.3, hemoglobin 13.8, hematocrit 42, platelets are 298, sodium 142, potassium 3.9, chloride 107, bicarbonate 27, BUN 11, creatinine 0.83, glucose is 165.  Blood cultures negative to date.   HOSPITAL COURSE: The patient is a 79 year old female who presented with acute on chronic respiratory failure. For details, please refer to the History and Physical.  1. Acute on chronic respiratory failure secondary to acute chronic obstructive pulmonary disease exacerbation as outlined below.  2. Acute chronic obstructive pulmonary disease exacerbation. No evidence of pneumonia or bronchitis on chest x-ray. The patient was on IV Solu-Medrol and changed to p.o. steroids. No need for antibiotics at this point. She will continue her outpatient inhalers. She is allergic to anticholinergics. She is on Advair 500/50.  3. Hypothyroidism. Continue Synthroid.  4. Hyperlipidemia. On Pravachol.  5. Tobacco use disorder. The patient relapsed.  She is smoking 2 packs a day. She did have a nicotine patch.    DISCHARGE MEDICATIONS:  1. Advair Diskus 500/50 b.i.d.  2. Symbicort 80/4.5 q. 12 hours p.r.n.  3. ProAir 2 puffs q. 4-6 h. p.r.n.  4. Loratadine 10 mg daily.  5. Pravastatin 40 mg daily.  6. Synthroid 75 mcg daily.  7. Vitamin D3 5000 international units daily.  8. Fish oil 1 tablet daily.  9. Vitamin C 500 mg daily.  10. Ibuprofen 200  mg p.r.n. headache.  11. Prednisone taper starting at 60 mg, taper by 10 mg every 3 days.  12. Nicotine patch 21 mg per 24 hours.  13. Aspirin 81 mg daily.   DISCHARGE OXYGEN: 2 liters as needed.   DISCHARGE DIET: Low sodium.   DISCHARGE ACTIVITY: As tolerated.  DISCHARGE FOLLOWUP:  The patient will follow up in approximately five days with Dr. Lorie PhenixNancy Maloney.    TIME SPENT: 35 minutes.   ____________________________ Janyth ContesSital P. Juliene PinaMody, MD spm:bjt D: 05/29/2011 11:55:41 ET T: 05/29/2011 12:28:38 ET JOB#: [id number]  cc: Constantino Starace P. Juliene PinaMody, MD, <Dictator> [name], MD Janyth ContesSITAL P Severiano Utsey MD ELECTRONICALLY SIGNED 06/01/2011 15:28

## 2014-05-06 NOTE — Discharge Summary (Signed)
PATIENT NAME:  Samantha Goodman, Samantha Goodman MR#:  [id number] DATE OF BIRTH:  10-18-1935  DATE OF ADMISSION:  12/30/2010 DATE OF DISCHARGE:  01/01/2011  PRIMARY CARE PHYSICIAN: Lorie PhenixNancy Maloney, MD  REASON FOR ADMISSION: Shortness of breath, cough, and wheezing.   DISCHARGE DIAGNOSES:  1. Acute chronic obstructive pulmonary disease exacerbation.  2. Acute bronchitis.  3. Hyperlipidemia. 4. Hypothyroidism. 5. Tobacco abuse, ongoing.  6. History of chronic obstructive pulmonary disease; the patient is on p.r.n. oxygen at home.  7. Hospitalization in the past for bronchitis and chronic obstructive pulmonary disease exacerbation.   DISCHARGE MEDICATIONS:  1. Oxygen at 2 liters per minute via nasal cannula p.r.n.  2. Prednisone taper, as written on prescription. 3. DuoNebs one dose inhaled every six hours p.r.n.  4. Azithromycin 250 mg p.o. daily x2 days.  5. Pravastatin 40 mg daily. 6. Omeprazole 20 mg daily.  7. Levothyroxine 75 mcg on Tuesday, Thursday, Saturday, and Sunday. 8. Levothyroxine 50 mcg on Monday, Wednesday, and Friday. 9. Albuterol metered dose inhaler 2 puffs every four hours p.r.n.  10. Protonix 40 mg daily.  11. Advair Diskus 500/50 one dose inhaled twice a day.  CONSULTANTS: None.   PROCEDURES: Portable chest x-ray on 12/30/2010: There is no pneumonia identified. There is discoid atelectasis and fibrosis of the right lung base. There is mild cardiomegaly.   PERTINENT LABS/STUDIES: CMP normal on admission except for serum potassium 3.3 and serum potassium 3.8 from 12/31/2010. Serum glucose slightly elevated at 124, felt to be steroid-induced.   Troponin negative on admission.   CBC normal on admission.  EKG: On admission normal sinus rhythm at 96 beats per minute with nonspecific ST and T wave abnormality.   DISCHARGE INSTRUCTIONS:  1. Take medications as prescribed. 2. Return to the emergency department for recurrence of symptoms or for worsening shortness of breath, cough,  fever, chills, or worsening wheezing.  FOLLOWUP INSTRUCTIONS: Followup with Dr. Elease HashimotoMaloney within 1 to 2 weeks.   REFERRALS: The patient is being referred to Dr. Ned ClinesHerbon Fleming of pulmonology within 2 to 3 weeks for COPD.  BRIEF HISTORY/HOSPITAL COURSE: The patient is a pleasant 79 year old female with past medical history of chronic obstructive pulmonary disease who is on p.r.n. oxygen, history of hyperlipidemia, hypothyroidism, and ongoing tobacco abuse who presented to the emergency department with complaints of worsening shortness of breath, cough, and wheezing. Please see dictated admission history and physical for pertinent details surrounding the onset of this hospitalization and please see below for further details.  1. Acute chronic obstructive pulmonary disease exacerbation - secondary to acute bronchitis leading to worsening shortness of breath, cough, and wheezing. Chest x-ray was obtained in the ER and was negative for pneumonia. Thereafter the patient was started on IV steroids and supplemental oxygen, was admitted to the medical floor, and maintained on antibiotics for bronchitis, in addition to bronchodilators. She was maintained on Advair. The patient has a Spiriva allergy. With the measures mentioned above, the patient's overall clinical condition has significantly improved with significant improvement of her wheezing and her cough has resolved and her shortness of breath has significantly improved as well. She has been weaned off oxygen to room air and oxygen saturations are normal at rest and with ambulation. She ambulated on the day of discharge and did not have any shortness of breath or any cough, but she states she has chronic wheezing which is at her baseline. Once her condition was noted to have improved, she was switched off IV steroids to prednisone taper, which  she will complete as an outpatient. She was also started on azithromycin and has two additional days remaining at the time  of discharge. She will be referred to pulmonology as an outpatient for ongoing management of her chronic obstructive pulmonary disease.  2. Acute bronchitis - as above the patient has completed three out of five days of antibiotics as an inpatient and has two additional days of Zithromax remaining upon discharge. Her cough has significantly improved as has her shortness of breath.  3. Hypothyroidism - the patient is to continue Synthroid.  4. Tobacco abuse - the patient was strongly counseled on the importance of smoking cessation.  On 01/01/2011 the patient was hemodynamically stable with significant improvement of her shortness of breath and cough, her wheezing was felt to be at baseline, and she was felt to be stable for discharge home with close outpatient follow-up to which the patient was agreeable.  DISCHARGE DISPOSITION: Home.   TIME SPENT ON DISCHARGE: Greater than 30 minutes. ____________________________ Elon Alas, MD knl:slb D: 01/05/2011 23:03:13 ET T: 01/07/2011 11:58:02 ET JOB#: [id number]  cc: Elon Alas, MD, <Dictator> Leo Grosser, MD Herbon E. Meredeth Ide, MD Elon Alas MD ELECTRONICALLY SIGNED 01/15/2011 16:24

## 2014-05-09 DIAGNOSIS — G4733 Obstructive sleep apnea (adult) (pediatric): Secondary | ICD-10-CM | POA: Diagnosis not present

## 2014-05-09 DIAGNOSIS — J440 Chronic obstructive pulmonary disease with acute lower respiratory infection: Secondary | ICD-10-CM | POA: Diagnosis not present

## 2014-05-09 DIAGNOSIS — I2510 Atherosclerotic heart disease of native coronary artery without angina pectoris: Secondary | ICD-10-CM | POA: Diagnosis not present

## 2014-05-09 DIAGNOSIS — F418 Other specified anxiety disorders: Secondary | ICD-10-CM | POA: Diagnosis not present

## 2014-05-09 DIAGNOSIS — E876 Hypokalemia: Secondary | ICD-10-CM | POA: Diagnosis not present

## 2014-05-09 DIAGNOSIS — J209 Acute bronchitis, unspecified: Secondary | ICD-10-CM | POA: Diagnosis not present

## 2014-05-09 DIAGNOSIS — J441 Chronic obstructive pulmonary disease with (acute) exacerbation: Secondary | ICD-10-CM | POA: Diagnosis not present

## 2014-05-09 DIAGNOSIS — D649 Anemia, unspecified: Secondary | ICD-10-CM | POA: Diagnosis not present

## 2014-05-09 DIAGNOSIS — Z9981 Dependence on supplemental oxygen: Secondary | ICD-10-CM | POA: Diagnosis not present

## 2014-05-09 DIAGNOSIS — F17210 Nicotine dependence, cigarettes, uncomplicated: Secondary | ICD-10-CM | POA: Diagnosis not present

## 2014-05-09 DIAGNOSIS — F329 Major depressive disorder, single episode, unspecified: Secondary | ICD-10-CM | POA: Diagnosis not present

## 2014-05-09 DIAGNOSIS — J9620 Acute and chronic respiratory failure, unspecified whether with hypoxia or hypercapnia: Secondary | ICD-10-CM | POA: Diagnosis not present

## 2014-05-09 NOTE — Discharge Summary (Signed)
PATIENT NAME:  DANIELE, DILLOW MR#:  [id number] DATE OF BIRTH:  [birth year]  DATE OF ADMISSION:  12/27/2013 DATE OF DISCHARGE:  12/29/2013  ADMITTING DIAGNOSIS:  Acute on chronic respiratory failure .  DISCHARGE DIAGNOSES:  1.  Acute on chronic respiratory failure with hypoxia.  2.  Right lower lobe pneumonia due to unknown infectious organism with recent sputum cultures growing Enterobacter cloacae.  3.  Chronic obstructive pulmonary disease exacerbation.  4.  Leukocytosis.  5.  Hypokalemia.   6.  Ongoing tobacco abuse.  7.  Suspected obstructive sleep apnea. Sleep study and to be scheduled as an outpatient.  8.  History of anxiety; coronary artery disease, status post stenting; hypothyroidism; chronic respiratory failure on 2 L of oxygen through nasal cannula at home at baseline.   DISCHARGE CONDITION: Stable.   DISCHARGE MEDICATIONS: The patient is to continue:  1.  ProAir HFA 2 puffs 4 times daily as needed.  2.  Plavix 75 mg p.o. daily.  3.  Tylenol 500 mg twice daily as needed.  4.  Citalopram 10 mg p.o. daily.  5.  Levothyroxine 88 mcg p.o. daily.  6.  Iron sulfate 325 mg p.o. daily.  7.  Pantoprazole 40 mg p.o. daily.  8.  Metoprolol succinate 25 mg p.o. daily 1/2 tablet daily.  9.  Furosemide 20 mg 2 tablets once daily.  10.  Atorvastatin 40 mg p.o. daily.  11.  Theophylline extended release 200 mg twice daily.  12.  Albuterol ipratropium 2.5/0.5 mg per 3 mL inhalation solution 4 times daily as needed.  13.  Symbicort 160/4.5 two puffs twice daily.  14.  Aspirin 81 mg p.o. daily.  15.  Loratadine 10 mg p.o. daily.  16.  Lorazepam 0.5 mg 1/2 tablet every 6 hours as needed.  17.  Vitamin D3 at 1000 units once daily.  18.  Daliresp 500 mcg p.o. daily.  19.  Mucinex extended-release 600 mg p.o. 3 times daily as needed.  20.  Montelukast 10 mg p.o. daily.  21.  Prednisone 40 mg p.o. once on 12/30/2013, then taper x 10 mg every 2 days until stopped.  22.  Ciprofloxacin 500 mg  p.o. twice daily for 10 more days. The patient is not to take azithromycin.   HOME HEALTH SERVICES UPON DISCHARGE: Physical therapy as well as nurse.   HOME OXYGEN: Portable tank at 2 L of oxygen through nasal cannula.   DIET: 2 grams salt, low-fat, low-cholesterol, regular consistency.   ACTIVITY LIMITATIONS: As tolerated.   FOLLOWUP APPOINTMENTS: With Dr. Freda Munro, pulmonary, in 1 week after discharge; Dr. Elease Hashimoto in 2-3 days after discharge.   CONSULTANTS: Care management; social work; Dr. Loreta Ave, pulmonary;  nurse practitioner, Mr. Windy Fast, palliative care.   RADIOLOGIC STUDIES: Chest x-ray, portable single view, 12/27/2013, revealed chronic lung disease with stable appearance of recently suspected infectious exacerbation of right lung base. No new cardiopulmonary abnormality is identified.   HOSPITAL COURSE: The patient is a 79 year old Caucasian female with a history of chronic respiratory failure on oxygen at home who presents to the hospital with complaints of worsening shortness of breath. Please refer to Dr. Eliane Decree note on 12/27/2013. She was admitted to the intensive care unit of significant shortness of breath with hypoxia requiring BiPAP use. She improved over a period of time with steroids, antibiotic therapy, and inhalation therapy. Antibiotics were initiated with ciprofloxacin in view of her recent sputum cultures growing Enterobacter cloacae sensitive to ciprofloxacin. Since patient improved significantly, patient was advised to continue  steroid taper as well as inhalation therapy and antibiotics for the next 10 days to complete course.   In regard to leukocytosis, which was noted on the day of admission with blood cell count at 13.6, patient's white blood cell count improved by 12/28/2013, and it was very likely stress related as well as pneumonia related.   Regarding hypokalemia, which was noted while she was in the hospital, it was supplemented.   For tobacco abuse, the  patient was counseled and nicotine replacement therapy was recommended.   For suspected obstructive sleep apnea, the patient was advised to get sleep study and sleep study was arranged for her upon discharge.   For history of anxiety, coronary artery disease hypothyroidism, chronic respiratory failure, the patient is to continue her outpatient management, no changes were made.   The patient was discharged in good condition with the above-mentioned medications and followup. On the day of discharge, temperature was 98.1, pulse was 55, respiration rate was 20, blood pressure 114/61, saturation was 96% on 2 L of oxygen per nasal cannula at rest.   TIME SPENT: 40 minutes.   ____________________________ Katharina Caperima Freemon Binford, MD rv:bm D: 12/29/2013 18:46:00 ET T: 12/30/2013 00:56:26 ET JOB#: [id number]  cc: Katharina Caperima Winifred Bodiford, MD, <Dictator> [name], MD [name], MD Ned Kakar MD ELECTRONICALLY SIGNED 01/15/2014 21:20

## 2014-05-09 NOTE — H&P (Signed)
PATIENT NAME:  Samantha Goodman, Samantha Goodman MR#:  [id number] DATE OF BIRTH:  1935-04-24  DATE OF ADMISSION:  12/22/2013  ADMITTING PHYSICIAN: Dr. Enid Baasadhika Johncarlo Goodman.   PRIMARY CARE PHYSICIAN: Dr. Lorie PhenixNancy Goodman.   CHIEF COMPLAINT: Difficulty breathing and wheezing.   HISTORY OF PRESENT ILLNESS: Samantha Goodman is a 79 year old Caucasian female with history of chronic respiratory failure secondary to COPD on 2 liters home oxygen, coronary artery disease status post stents in the past, hypothyroidism, hypertension, who presented to the hospital secondary to worsening of breathing since yesterday. She had multiple hospitalizations in the past for similar complaints, most recent hospitalization being about 2 weeks. She says she stopped smoking this time after her discharge, has not been smoking, around, doing well up until yesterday she started to have some shortness of breath. She went to see her pulmonologist who did an x-ray and she was called in this morning, asked to come for followup visit for possible pneumonia. She was started on prednisone taper and Z-Pak since yesterday. However the patient's difficulty breathing got worse, her cough was worsening, so she presented to the ER. Chest x-ray revealed pneumonia and she is being admitted for pneumonia and CHF exacerbation.   PAST MEDICAL HISTORY:  1.  Chronic respiratory failure secondary to COPD on 2 liters home oxygen.  2.  Generalized anxiety disorder.  3.  CAD status post stents placement.  4.  Hypothyroidism.   PAST SURGICAL HISTORY:  Includes  cardiac stents.   SOCIAL HISTORY: Was living at home by herself but states she was going to move in with her granddaughter.  Quit smoking for almost 3 weeks now since her last discharge. No alcohol or drug abuse.    ALLERGIES TO MEDICATIONS: CODEINE AND SPIRIVA AND ALSO LEVAQUIN.   CURRENT HOME MEDICATIONS:  1. DuoNebs 3 mL 4 times a day as needed for shortness of breath.  2. Aspirin 81 mg p.o. daily.   3. Atorvastatin 40 mg p.o. daily.  4. Celexa 20 mg p.o. daily.  5. Plavix 75 mg p.o. daily.  6. Daliresp 500 mcg p.o. daily.  7. Ferrous sulfate 325 mg p.o. daily.  8. Lasix 40 mg p.o. daily.  9. Synthroid 88 mcg p.o. daily.  10. Loratadine 10 mg p.o. daily.  11. Ativan 0.5 mg q. 6 hours p.r.n. for anxiety.  12. Toprol 12.5 mg p.o. daily.  13. Singulair 10 mg p.o. daily.  14. Protonix 40 mg p.o. daily.  15. Prednisone taper that was started yesterday.  16. Z-Pak started yesterday.  17. Proair 90mcg inhaler, 2 puffs 4 times a day as needed for shortness of breath.  18. Symbicort 160/4.5 mcg 2 puffs b.i.d.  19. Theophylline 200 mg tablet twice a day.  20. Tylenol 500 mg 1 tablet p.o. b.i.d. p.r.n. for pain.  21. Vitamin D3 1000 international units p.o. daily.   FAMILY HISTORY: Significant for heart disease and hypertension in the family.   REVIEW OF SYSTEMS:   CONSTITUTIONAL: No fever, fatigue, or weakness.  EYES: No blurred vision, double vision, inflammation, or glaucoma.  EARS, NOSE, AND THROAT: No tinnitus, ear pain, hearing loss, epistaxis, or discharge.  RESPIRATORY: Positive for cough, wheeze, and COPD.  No hemoptysis, no painful respiration.  CARDIOVASCULAR: No chest pain, orthopnea, edema, arrhythmia, palpitations, or syncope.  GASTROINTESTINAL: No nausea, vomiting, diarrhea, abdominal pain, hematemesis, or melena.  GENITOURINARY: No dysuria, hematuria, renal calculus, frequency, or incontinence.  ENDOCRINE: No polyuria, nocturia, thyroid problems, heat or cold intolerance.  HEMATOLOGY: No anemia, easy bruising, or  bleeding.  SKIN: No acne, rash or lesions.  MUSCULOSKELETAL: Positive for arthritis. No gout.  NEUROLOGIC: No numbness, weakness, CVA, TIA, or seizures.  PSYCHOLOGICAL: No anxiety, insomnia, or depression.   PHYSICAL EXAMINATION:  VITAL SIGNS: Temperature 97.8 degrees Fahrenheit, pulse 97, respirations 21, blood pressure 94/55, pulse oximetry 97% on 2 liters  oxygen.  GENERAL: Well-built, well-nourished female lying in bed, not in any acute distress.  HEENT: Normocephalic, atraumatic. Pupils equal, round, reacting to light. Anicteric sclerae. Extraocular movements intact. Oropharynx clear without erythema, mass, or exudates.  NECK: Supple. No thyromegaly, JVD, or carotid bruits. No lymphadenopathy.  LUNGS: Moving air bilaterally. Use of accessory muscles for breathing noted when we were talking especially. She has diffuse scattered wheeze. No crackles noted. No rhonchi.  CARDIOVASCULAR: S1, S2, regular rate and rhythm. No murmurs, rubs, or gallops.  ABDOMEN: Soft, nontender, nondistended. No hepatosplenomegaly. Normal bowel sounds.  EXTREMITIES: No pedal edema. No clubbing or cyanosis. 2 + dorsalis pedis pulses palpable bilaterally.  SKIN: No acne, rash, or lesions.  LYMPHATICS: No cervical or inguinal lymphadenopathy.   NEUROLOGIC: Cranial nerves intact. No focal motor or sensory deficits.  PSYCHOLOGICAL: The patient is awake, alert, oriented x 3.   LABORATORY DATA: WBC 7.3, hemoglobin 11.8, hematocrit 36.8, platelet count 267,000.   Sodium 139, potassium 3.5  chloride 103, bicarbonate 29, BUN 17, creatinine 1.03, glucose 131, and calcium of 8.0. Troponin is negative. Chest x-ray showing mild infiltrate on the right lung base consistent with pneumonia, scarring left lung base, diffuse degenerative disease and osteopenia of the thoracic spine noted.   ASSESSMENT AND PLAN: A 79 year old lady with chronic respiratory failure secondary to chronic obstructive pulmonary disease, coronary artery disease, hypothyroidism, being admitted for acute on chronic respiratory failure secondary to chronic obstructive pulmonary disease exacerbation and  pneumonia.   1.  Acute on chronic obstructive pulmonary disease exacerbation, on Solu-Medrol, nebulizer treatments. Continue her inhalers and monitor. The patient on Singulair and also theophylline which we will  continue at this time.   2.  Pneumonia. Blood cultures ordered on Rocephin and azithromycin.   3.  Hypothyroidism.  Continue her Synthroid.  4.  Coronary artery disease, status post stents, appears stable at this time. Continue aspirin, statin and Toprol.   5.  Deep vein thrombosis prophylaxis with Lovenox.   CODE STATUS: Full code.   TIME SPENT ON ADMISSION: 50 minutes.    ____________________________ Samantha Baas, MD rk:bu D: 12/22/2013 19:33:00 ET T: 12/22/2013 20:11:45 ET JOB#: [id number]  cc: Samantha Baas, MD, <Dictator> Samantha Baas MD ELECTRONICALLY SIGNED 01/16/2014 18:27

## 2014-05-11 DIAGNOSIS — J209 Acute bronchitis, unspecified: Secondary | ICD-10-CM | POA: Diagnosis not present

## 2014-05-11 DIAGNOSIS — I2510 Atherosclerotic heart disease of native coronary artery without angina pectoris: Secondary | ICD-10-CM | POA: Diagnosis not present

## 2014-05-11 DIAGNOSIS — J9620 Acute and chronic respiratory failure, unspecified whether with hypoxia or hypercapnia: Secondary | ICD-10-CM | POA: Diagnosis not present

## 2014-05-11 DIAGNOSIS — F418 Other specified anxiety disorders: Secondary | ICD-10-CM | POA: Diagnosis not present

## 2014-05-11 DIAGNOSIS — Z9981 Dependence on supplemental oxygen: Secondary | ICD-10-CM | POA: Diagnosis not present

## 2014-05-11 DIAGNOSIS — F17210 Nicotine dependence, cigarettes, uncomplicated: Secondary | ICD-10-CM | POA: Diagnosis not present

## 2014-05-11 DIAGNOSIS — G4733 Obstructive sleep apnea (adult) (pediatric): Secondary | ICD-10-CM | POA: Diagnosis not present

## 2014-05-11 DIAGNOSIS — D649 Anemia, unspecified: Secondary | ICD-10-CM | POA: Diagnosis not present

## 2014-05-11 DIAGNOSIS — J441 Chronic obstructive pulmonary disease with (acute) exacerbation: Secondary | ICD-10-CM | POA: Diagnosis not present

## 2014-05-11 DIAGNOSIS — F329 Major depressive disorder, single episode, unspecified: Secondary | ICD-10-CM | POA: Diagnosis not present

## 2014-05-11 DIAGNOSIS — E876 Hypokalemia: Secondary | ICD-10-CM | POA: Diagnosis not present

## 2014-05-11 DIAGNOSIS — J440 Chronic obstructive pulmonary disease with acute lower respiratory infection: Secondary | ICD-10-CM | POA: Diagnosis not present

## 2014-05-13 NOTE — Consult Note (Signed)
Chief Complaint:  Subjective/Chief Complaint admitted for acute respiratory failure due to COPD. starting to improve now   VITAL SIGNS/ANCILLARY NOTES: **Vital Signs.:   25-Jan-16 08:05  Celsius 36.5    11:40  Vital Signs Type Routine  Temperature Temperature (F) 97.4  Celsius 36.3  Temperature Source oral  Pulse Pulse 77  Respirations Respirations 20  Systolic BP Systolic BP 563  Diastolic BP (mmHg) Diastolic BP (mmHg) 68  Mean BP 83  Pulse Ox % Pulse Ox % 94  Pulse Ox Activity Level  At rest  Oxygen Delivery 4L  *Intake and Output.:   Shift 25-Jan-16 15:00  Grand Totals Intake:  480 Output:  1325    Net:  -845 24 Hr.:  -845  Oral Intake      In:  480  Urine ml     Out:  1325  Length of Stay Totals Intake:  2255 Output:  8756    Net:  -1737   Brief Assessment:  GEN no acute distress, obese   Cardiac Regular  -- thrills  -- JVD  --Rub  --Gallop   Respiratory normal resp effort  clear BS  no use of accessory muscles   Gastrointestinal details normal Soft  Nontender  Nondistended  No masses palpable  Bowel sounds normal   EXTR negative cyanosis/clubbing   Lab Results: Routine Chem:  25-Jan-16 04:53   Glucose, Serum  122  BUN  23  Creatinine (comp) 0.95  Sodium, Serum 142  Potassium, Serum 3.7  Chloride, Serum 100  CO2, Serum  36  Calcium (Total), Serum 9.3  Anion Gap  6  Osmolality (calc) 288  eGFR (African American) >60  eGFR (Non-African American) >60 (eGFR values <34m/min/1.73 m2 may be an indication of chronic kidney disease (CKD). Calculated eGFR, using the MRDR Study equation, is useful in  patients with stable renal function. The eGFR calculation will not be reliable in acutely ill patients when serum creatinine is changing rapidly. It is not useful in patients on dialysis. The eGFR calculation may not be applicable to patients at the low and high extremes of body sizes, pregnant women, and vegetarians.)  Routine Hem:  25-Jan-16 04:53    Hemoglobin (CBC)  10.9 (Result(s) reported on 05 Feb 2014 at 05:43AM.)   Radiology Results: XRay:    23-Jan-16 17:35, Chest Portable Single View  Chest Portable Single View   REASON FOR EXAM:    Shortness of Breath  COMMENTS:       PROCEDURE: DXR - DXR PORTABLE CHEST SINGLE VIEW  - Feb 03 2014  5:35PM     CLINICAL DATA:  ED via [facility] from home c/o breathing problems. SOB X  a few hours per pt.lung sounds wet upon arrival,pt on 3L [place] at home  chronically. pt arrives on CPAP by EMS. recently had pnuemonia    EXAM:  PORTABLE CHEST - 1 VIEW    COMPARISON:  01/23/2014    FINDINGS:  Lungs hyperexpanded. Mild hazy opacity at the lung bases suggests  atelectasis with small effusions. No convincing pneumonia. Scarring  is noted at the apices.    Cardiac silhouette is mildly enlarged. No mediastinal or hilar  masses.    Bony thorax is demineralized but grossly intact.     IMPRESSION:  1. Basilar opacity is similar to the prior exam. Although pneumonia  is possible, the appearance is most suggestive of atelectasis.  Probable small associated effusions.  2. Underlying COPD.  No pulmonary edema.  Electronically Signed    By:  David  Ormond M.D.    On: 02/03/2014 17:41         Verified By: DAVID E. ORMOND, M.D.,   Assessment/Plan:  Assessment/Plan:  Assessment 1. COPD with exacerbation -will continue with bronchodilators -continue with oxygen as needed -follow up CXR for possible effusion once discharged   Electronic Signatures: ,  A (MD)  (Signed 25-Jan-16 21:47)  Authored: Chief Complaint, VITAL SIGNS/ANCILLARY NOTES, Brief Assessment, Lab Results, Radiology Results, Assessment/Plan   Last Updated: 25-Jan-16 21:47 by ,  A (MD) 

## 2014-05-13 NOTE — H&P (Signed)
PATIENT NAME:  Samantha Goodman, Samantha Goodman MR#:  [id number] DATE OF BIRTH:  [birth year]  DATE OF ADMISSION:  01/20/2014  PRIMARY CARE PHYSICIAN: Lorie Phenix, MD.   PRIMARY PULMONOLOGIST: Freda Munro, MD.   REFERRING EMERGENCY ROOM PHYSICIAN: Clydie Braun   CHIEF COMPLAINT: Shortness of breath.   HISTORY OF PRESENT ILLNESS: A 79 year old female with a history of COPD pulmonary disease and chronic respiratory failure on 2 liters of oxygen at home, has had recurrent admissions in the past few months of hospital and ER visits because of COPD. She was recently discharged on December 18 for her COPD. This morning, she said that she woke up as her usual but then gradually started feeling more and more short of breath. By noon, she was severely short of breath. She tried her nebulizer treatment twice at home, but did not get much relief and so decided to come to the emergency room. In the ER, she was noted to have pneumonia on chest x-ray and given nebulizer therapy but did not have much relief and so given to the hospitalist team for further management.   REVIEW OF SYSTEMS:  CONSTITUTIONAL: No fever, fatigue, weakness, pain or weight loss.   EYES: No blurring, double vision, discharge or redness.   EARS, NOSE, THROAT: No tinnitus, ear pain or hearing loss.   RESPIRATORY: As mentioned above. Negative for cough and sputum production.   CARDIOVASCULAR: No chest pain, orthopnea, edema, arrhythmia, palpitations.   GASTROINTESTINAL: No nausea, vomiting, diarrhea, abdominal pain.   GENITOURINARY: No dysuria, hematuria, increased frequency.   ENDOCRINE: No heat or cold intolerance.   SKIN: No acne, rashes or lesions.   MUSCULOSKELETAL: No pain or swelling in the joints.   NEUROLOGICAL: No numbness, weakness, tremor.   PSYCHIATRIC: No anxiety, insomnia, bipolar disorder.   PAST MEDICAL HISTORY: 1. Chronic obstructive pulmonary disease.  2. Chronic respiratory failure with 2 liters oxygen use.  3. Generalized  anxiety disorder.  4. Coronary artery disease.  5. Status post stent.  6. Hypothyroidism.   PAST SURGICAL HISTORY: 1. Coronary artery stent.  2. Right eye surgery. 3. Total hysterectomy.  4. Surgery of the left ankle.  5. Left eye implant.   SOCIAL HISTORY: She quit smoking 2 months ago. No alcohol or drug use. Lives at home alone and does not require any support to walk.   FAMILY HISTORY: Positive for heart disease and hypertension.   ALLERGIES: SPIRIVA.  HOME MEDICATIONS:  1. Vitamin D 3000 international units once a day.  2. Tylenol 500 mg oral tablet 2 times a day.  3. Theophylline 200 mg extended release 2 times a day.  4. Symbicort 2 puffs inhalation 2 times a day.  5. ProAir 2 puffs inhalation 4 times a day as needed for shortness of breath.  6. Pantoprazole 40 mg oral once a day.  7. Mucinex 600 mg oral 3 times a day.  8. Montelukast 10 mg oral once a day.  9. Metoprolol 25 mg take one half tablet once a day.  10. Lorazepam 0.5 mg take one half tablet every 6 hours as needed for anxiety.  11. Loratadine 10 mg oral once a day.  12. Levothyroxine 88 mcg once a day.  13. Furosemide 20 mg oral 2 tablets once a day.  14. Ferrous sulfate 325 mg oral once a day.  15. Daliresp 400 mcg oral once a day.  16. Plavix 75 mg oral once a day.  17. Citalopram 20 mg oral once a day.  18. Atorvastatin 40 mg  oral once a day.  19. Aspirin 81 mg oral once a day.   PHYSICAL EXAMINATION: VITAL SIGNS: In ER, temperature 98.2, pulse 88, respiration 23, blood pressure 112/58 and pulse oximetry is 88 on room air. It came up to 92% with 2 liters of oxygen supplementation.   GENERAL: The patient is fully alert and oriented to time, place and person in slight respiratory distress.   HEENT: Head and neck atraumatic. Conjunctivae pink. Oral mucosa moist.   NECK: Supple. Thyroid nontender. No JVD.   RESPIRATORY: Bilateral equal air entry. Some wheezing present.   CARDIOVASCULAR: S1, S2  present, regular. No murmur.   ABDOMEN: Soft, nontender. Bowel sounds present. No organomegaly.   SKIN: No acne, rashes, or lesions.   MUSCULOSKELETAL: No tenderness or swelling. Legs: No edema.   NEUROLOGICAL: Power 5/5 in all 4 limbs. Follows commands. No tremor or rigidity. Sensation is intact.   PSYCHIATRIC: Does not appear in any acute psychiatric illness at this time.   IMPORTANT LABORATORY RESULTS: Chest x-ray, portable, single view, was done which showed patchy right lower lobe opacity. Atelectasis versus pneumonia.  Glucose 112, BUN 14, creatinine 0.80, sodium 143, potassium 2.7, chloride 101, CO2 of 36. Total protein 6.7, albumin 3.2, bilirubin 0.3, alkaline phosphate 91, SGOT 29 and SGPT 20. Troponin less than 0.02. WBC 10.1, hemoglobin 11.2, platelet count 293,000 and MCV is 85.   ASSESSMENT AND PLAN: A 79 year old female with chronic obstructive pulmonary disease and chronic respiratory failure and came to hospital after recent discharge with respiratory distress today and found having pneumonia.  1. Pneumonia. As per chest x-ray, there is pneumonia present. She has chronic obstructive pulmonary disease and underlying lung issues. We will give her Rocephin and azithromycin for now and treat her chronic obstructive pulmonary disease.  2. Acute exacerbation of chronic obstructive pulmonary disease. We will give her intravenous Solu-Medrol and DuoNeb therapy. We will continue her to Symbicort, theophylline and Daliresp from home. The patient is allergic to Spiriva so is not a candidate.  3. Generalized anxiety disorder. We will continue her citalopram and lorazepam as needed.  4. Hypothyroidism. Continue levothyroxine.  5. Hypokalemia. Replace potassium orally and recheck tomorrow.   TOTAL TIME SPENT ON THIS ADMISSION: 50 minutes.    ____________________________ Hope PigeonVaibhavkumar G. Elisabeth PigeonVachhani, MD vgv:TT D: 01/20/2014 17:33:48 ET T: 01/20/2014 17:50:12  ET JOB#: [id number]  cc: Hope PigeonVaibhavkumar G. Elisabeth PigeonVachhani, MD, <Dictator> [name], MD Heath GoldVAIBHAVKUMAR [facility] CenterVACHHANI MD ELECTRONICALLY SIGNED 02/05/2014 23:06

## 2014-05-13 NOTE — Discharge Summary (Signed)
PATIENT NAME:  Samantha Goodman, Brynnlie L MR#:  [id number] DATE OF BIRTH:  15-Jan-1935  DATE OF ADMISSION:  02/23/2014 DATE OF DISCHARGE:  02/25/2014  PRESENTING COMPLAINT: Shortness of breath.   DISCHARGE DIAGNOSES:  1. Acute on chronic respiratory failure secondary to chronic obstructive pulmonary disease exacerbation.  2. Chronic respiratory failure on chronic home oxygen.  3. Suspected obstructive sleep apnea.  4. Hypokalemia.  5. Chronic anemia.   CODE STATUS: No code, DO NOT RESUSCITATE.   DISCHARGE MEDICATIONS:  1. ProAir HFA 2 puffs 4 times a day as needed.  2. Tylenol 500 one tablet b.i.d. as needed.  3. Citalopram 20 mg p.o. daily.  4. Levothyroxine 88 mcg 1 tablet daily.  5. Ferrous sulfate 325 mg p.o. daily.  6. Protonix 40 mg daily.  7. Metoprolol 25 mg 1/2 tablet daily.  8. Theophylline 200 mg extended release 1 capsule b.i.d.  9. Symbicort 160/4.5 two puffs b.i.d.  10. Aspirin 81 mg daily.  11. Loratadine 10 mg daily.  12. Lorazepam 0.5 mg half tablet every 6 hours as needed.  13. Vitamin D p.o. daily.  14. Daliresp 500 mcg p.o. daily.  15. Albuterol.  16. DuoNebs 3 mL 4 times a day as needed.  17. Mucinex 600 mg t.i.d. as needed.  18. Atorvastatin 40 mg daily.  19. Plavix 75 mg daily.  20. Fluticasone 1 spray daily.  21. Prednisone taper.  22. Cefdinir 300 mg b.i.d., complete your prescription.  23. Singulair 10 mg daily.  24. Lasix 20 mg 2 tablets daily.  25. Mucomyst 3 mL 4 times a day as needed as nebulizer.  Resume her home health services.   Follow with Dr. Elease HashimotoMaloney in 2 weeks.    LABORATORY DATA: Hemoglobin and hematocrit is 8.7 and 27.2. Creatinine is 0.90.   BRIEF SUMMARY OF HOSPITAL COURSE: [name] is a 79 year old Caucasian female with chronic respiratory failure, comes in with:   1. Acute on acute on chronic obstructive pulmonary disease exacerbation. No obvious triggers. Reports no smoking. Chest x-ray shows no pneumonia. She was started on IV  Solu-Medrol, nebulizer treatments. She will finish up her p.o. prednisone and p.o. antibiotic course that was prescribed by Dr. Elease HashimotoMaloney. She remained afebrile. White count was stable.  2. Hypokalemia. Repleted.  3. Chronic anemia. Hemoglobin is stable.  4. Hypertension. Resumed home medications.   Overall hospital stay otherwise remained stable. She remained a no code, DO NOT RESUSCITATE.   TIME SPENT: 40 minutes.  ____________________________ Wylie HailSona A. Allena KatzPatel, MD sap:ap D: 02/25/2014 13:37:30 ET T: 02/25/2014 14:18:30 ET JOB#: [id number]  cc: Mirha Brucato A. Allena KatzPatel, MD, <Dictator> [name], MD Willow OraSONA A Alfie Alderfer MD ELECTRONICALLY SIGNED 03/13/2014 17:31

## 2014-05-13 NOTE — Discharge Summary (Signed)
PATIENT NAME:  Samantha Goodman, Samantha Goodman MR#:  [id number] DATE OF BIRTH:  11/07/1935  DATE OF ADMISSION:  01/20/2014 DATE OF DISCHARGE:  01/23/2014  ADMITTING PHYSICIAN: Hope PigeonVaibhavkumar G. Elisabeth PigeonVachhani, MD  DISCHARGING PHYSICIAN: Enid Baasadhika Alessia Gonsalez, MD   PRIMARY CARE PHYSICIAN: [name], MD  PRIMARY PULMONOLOGIST: [name], MD  CONSULTATIONS IN THE HOSPITAL: None.   DISCHARGE DIAGNOSES:  1.  Acute on chronic obstructive pulmonary disease exacerbation.  2.  Pneumonia.  3.  Depression and anxiety.  4.  Hyperlipidemia.  5.  Tobacco use disorder.   DISCHARGE HOME MEDICATIONS:  1.  ProAir inhaler 2 puffs 4 times a day as needed for wheezing, shortness of breath.  2.  Plavix 75 mg p.o. daily.  4.  Tylenol 500 mg p.o. b.i.d. p.r.n. for pain.  5.  Celexa 20 mg p.o. daily.  6.  Synthroid 88 mcg p.o. daily.  7.  Ferrous sulfate 325 mg p.o. daily.  8.  Protonix 40 mg p.o. daily.  9.  Toprol 12.5 mg p.o. daily.  10.  Atorvastatin 40 mg p.o. daily.  11.  Theophylline 200 mg p.o. b.i.d.  12.  Symbicort 160/4.5 mcg 2 puffs twice a day.  13.  Aspirin 81 mg p.o. daily.  14.  Loratadine 10 mg p.o. daily.  15.  Ativan 0.5 mg every 6 hours as needed for anxiety/nervousness.  16.  Vitamin D3 at 1000 international units daily.  17.  Allerest 500 mcg p.o. daily.  18.  Montelukast 10 mg p.o. daily.  19.  DuoNebs 3 mL 4 times a day as needed for shortness of breath.  20.  Lasix 20 mg p.o. daily.  21.  Mucinex 600 mg 3 times a day.  22.  Prednisone taper over 12 days.  23.  Acetylcysteine inhalation solution 20% half in 3 mL inhaled twice a day. Use with bronchodilators.  24.  Augmentin 875 mg 1 tablet twice a day for 3 more days.   DISCHARGE HOME OXYGEN: 2 liters.   DISCHARGE DIET: Low-sodium diet.   DISCHARGE ACTIVITY: As tolerated.    FOLLOWUP INSTRUCTIONS:  1.  Resume home health services.  2.  Follow up with Dr. Freda MunroSaadat Khan in 1 week. The patient already has an appointment.  3.  PCP  followup in 2 weeks.   LABORATORIES AND IMAGING STUDIES PRIOR TO DISCHARGE: Chest x-ray showing bibasilar consolidation, right greater than left, atelectasis versus pneumonitis; cardiomegaly noted. WBC 8.7, hemoglobin 10.4, hematocrit 31.1, platelet count 295,000. Sodium 142, potassium has been placed improved to up 3.0, chloride 100, bicarbonate 33, BUN 17, creatinine 0.94, glucose 135, and calcium of 8.7. Blood cultures were negative.   BRIEF HOSPITAL COURSE: Samantha Goodman is a 79 year old Caucasian female with COPD on home oxygen 2 liters, ongoing smoking, hypertension, multiple admissions to the hospital for COPD exacerbations, comes again for same complaints. This time, chest x-ray showed pneumonia as well.   Acute on chronic COPD exacerbation with x-ray with pneumonia. Admitted for IV steroids, nebulizers, inhalers, antibiotics. Dr. Welton FlakesKhan was consulted initially and then she had an appointment anyway. She was advised to follow up as an outpatient. She was treated. She was strongly counseled against smoking because the patient each time she gets discharged she goes home and takes off her oxygen, smokes, and comes right back in. She had significant cough this time. Acetylcysteine has been added to her nebulizers twice a day and that really improved the breakdown of her mucus and with the help of Mucomyst she was able to  clear her phlegm. She started feeling better, back to her baseline so she is being discharged and advised to follow up with Dr. Welton Flakes and not to smoke again. She was given a 12-day prednisone taper with Augmentin antibiotic to finish off the course along with her inhalers and nebulizers.   All of her other home medications are being continued without any changes.   DISCHARGE CONDITION: Stable.   DISCHARGE DISPOSITION: Home with home health.   TIME SPENT ON DISCHARGE: 40 minutes.    ____________________________ Enid Baas, MD rk:ts D: 01/27/2014 12:58:56 ET T: 01/28/2014  00:50:14 ET JOB#: [id number]  cc: Enid Baas, MD, <Dictator> Leo Grosser, MD Yevonne Pax, MD  Enid Baas MD ELECTRONICALLY SIGNED 01/29/2014 16:19

## 2014-05-13 NOTE — Consult Note (Signed)
PATIENT NAME:  Samantha Goodman, Samantha Goodman MR#:  [id number] DATE OF BIRTH:  1935/04/01  DATE OF CONSULTATION:  02/24/2014  CONSULTING PHYSICIAN:  [name], MD  PULMONARY CONSULTATION:  HISTORY OF PRESENT ILLNESS:  The patient is very well known to me from the office. She is a 79 year old female who has a past medical history significant for COPD, who came in to the hospital because she was having increasing shortness of breath, cough, and congestion. She had also noticed increased wheeze. She denied having any chest pain. No palpitations. Initially, when she was admitted, she was started on IV steroids and antibiotics and also was given aggressive nebulizer treatment. The patient had been started on antibiotics as an outpatient by Dr. Elease HashimotoMaloney also. On the second day of admission, she stated that she started feeling better. She had less wheeze and less cough noted.   PAST MEDICAL HISTORY:  Significant for COPD and chronic respiratory failure on chronic oxygen therapy. She also has a history of hypothyroidism, chronic depression and anxiety, and hypertension.   PAST SURGICAL HISTORY:  Noncontributory.   SOCIAL HISTORY:  She does not smoke or drink.    MEDICATIONS:  Reviewed on the electronic medical record.   ALLERGIES:  Negative.   CODE STATUS:  The patient is DO NOT RESUSCITATE.   PHYSICAL EXAMINATION: GENERAL:  At the time she was seen, she was awake and alert.  VITAL SIGNS:  Stable. She was afebrile.  NECK:  Supple. No JVD. No adenopathy. No thyromegaly. Extraocular movements were intact. Mouth was clear.  CHEST:  Coarse breath sounds. No rales or rhonchi. Expansion was equal.  CARDIOVASCULAR:  S1 and S2 were normal. Regular rhythm. No gallop or rub.  ABDOMEN:  Soft and nontender.  EXTREMITIES:  Without edema, cyanosis, or clubbing. Pulses were equal.  NEUROLOGIC:  Awake and alert and moving all 4 extremities. Gait was not checked.   LABORATORY DATA:  Reviewed on the electronic chart.    IMPRESSION:  1.  Acute-on-chronic respiratory failure.  2.  Chronic obstructive pulmonary disease with exacerbation.   PLAN:  I agree with steroids, which will be continued. Continue with aggressive bronchodilator therapy. She should be discharged on oral antibiotics as well as oral steroids. We will be happy to see the patient back in the office after discharge.    ____________________________ [name], MD sak:nb D: 02/25/2014 21:44:00 ET T: 02/25/2014 23:02:16 ET JOB#: [id number]  cc: [name], MD, <Dictator> Yevonne PaxSAADAT A KHAN MD ELECTRONICALLY SIGNED 03/04/2014 12:34

## 2014-05-13 NOTE — H&P (Signed)
PATIENT NAME:  AMIT, MELOY MR#:  [id number] DATE OF BIRTH:  [birth year]  DATE OF ADMISSION:  02/03/2014  REFERRING PHYSICIAN: Cecille Amsterdam. Mayford Knife, MD in the Emergency Room.   PRIMARY CARE PHYSICIAN: Leo Grosser, MD  REASON FOR ADMISSION: Acute on chronic respiratory failure requiring BiPAP.   HISTORY OF PRESENT ILLNESS: The patient is a 79 year old female hospitalized here approximately 2 weeks ago with acute on chronic COPD exacerbation. She was sent home with home health. She continues to smoke. Presents to the Emergency Room today with worsening shortness of breath. In the Emergency Room, the patient was profoundly tachypneic and was placed on BiPAP. She is now admitted for further evaluation.   PAST MEDICAL HISTORY:  1.  Chronic respiratory failure, on oxygen.  2.  End-stage COPD.  3.  Recurrent pneumonia.  4.  Anxiety/depression.  5.  Hyperlipidemia.  6.  Persistent tobacco abuse.  7.  Hypothyroidism.  8.  Status post hysterectomy.  9.  ASCVD, status post PTCA with stent placement.   MEDICATIONS:  1.  ProAir 2 puffs q.i.d. as needed.  2.  Plavix 75 mg p.o. daily.  3.  Celexa 20 mg p.o. daily.  4.  Synthroid 88 mcg p.o. daily.  5.  Iron sulfate 325 mg p.o. daily.  6.  Protonix 40 mg p.o. daily.  7.  Toprol-XL 12.5 mg p.o. daily.  8.  Lipitor 40 mg p.o. daily.  9.  Theophylline 200 mg p.o. b.i.d.  10.  Symbicort 160/4.5 two puffs b.i.d.  11.  Aspirin 81 mg p.o. daily.  12.  Claritin 10 mg p.o. daily.  13.  Ativan 0.5 mg p.o. q. 6 hours p.r.n.  14.  Vitamin D3 at 1000 units p.o. daily.  15.  Daliresp 500 mcg p.o. daily.  16.  Singulair 10 mg p.o. daily.  17.  DuoNeb SVNs q.i.d. as needed.  18.  Lasix 20 mg p.o. daily.  19.  Mucinex 600 mg p.o. t.i.d.  20.  Prednisone taper as directed.  21.  Acetylcysteine inhalation solution 20% 3 mL b.i.d.  22.  Augmentin 875 mg p.o. b.i.d.   ALLERGIES: CODEINE, LEVAQUIN, AND SPIRIVA.   SOCIAL HISTORY: The patient continues  to smoke. Denies alcohol abuse.   FAMILY HISTORY: Positive for hypertension, coronary artery disease, and stroke.   REVIEW OF SYSTEMS:  CONSTITUTIONAL: No fever or change in weight.  EYES: No blurred or double vision. No glaucoma.  ENT: No tinnitus or hearing loss. No nasal discharge or bleeding. No difficulty swallowing.  RESPIRATORY: The patient has had cough and wheezing. Denies hemoptysis. No painful respiration.  CARDIOVASCULAR: No chest pain or palpitations. No syncope.  GASTROINTESTINAL: No nausea, vomiting, or diarrhea. No abdominal pain or change in bowel habits.  GENITOURINARY: No dysuria or hematuria. No incontinence.  ENDOCRINE: No polyuria or polydipsia. No heat or cold intolerance.  HEMATOLOGIC: The patient denies anemia, easy bruising, or bleeding.  LYMPHATIC: No swollen glands.  MUSCULOSKELETAL: The patient denies pain in her neck, back, shoulders, knees, or hips. No gout.  NEUROLOGIC: No numbness or migraines. Denies stroke or seizures.  PSYCHOLOGICAL: The patient denies anxiety, insomnia, or depression.   PHYSICAL EXAMINATION:  GENERAL: The patient is acutely ill-appearing, in moderate distress.  VITAL SIGNS: Remarkable for a blood pressure of 105/55, heart rate 78, respiratory rate 22, temperature 97.9, saturation 96% on BiPAP.  HEENT: Normocephalic, atraumatic. Pupils are equally round and reactive to light and accommodation. Extraocular movements are intact. Sclerae are anicteric. Conjunctivae are clear.  Oropharynx  is dry, but clear.  NECK: Supple without JVD. No adenopathy or thyromegaly is noted.  LUNGS: Revealed decreased breath sounds with diffuse wheezes and rhonchi. No rales. No dullness. Respiratory effort is increased.  CARDIAC: Regular rate and rhythm. Normal S1, S2. No significant rubs, murmurs, or gallops. PMI is nondisplaced. Chest wall is nontender.  ABDOMEN: Soft, nontender with normoactive bowel sounds. No organomegaly or masses were appreciated. No  hernias or bruits were noted.  EXTREMITIES: Without clubbing, cyanosis, or edema. Pulses were 2+ bilaterally.  SKIN: Warm and dry without rash or lesions.  NEUROLOGIC: Revealed cranial nerves II through XII grossly intact. Deep tendon reflexes were symmetric. Motor and sensory exam is nonfocal.  PSYCHIATRIC: Revealed a patient who is alert and oriented to person, place, and time. She was cooperative and used good judgment.   LABORATORY DATA: EKG revealed sinus rhythm with no acute ischemic changes. Chest x-ray revealed bibasilar opacities suggesting atelectasis with underlying COPD. No pulmonary edema was noted. Her white count was 16.3 with a hemoglobin of 11.5. Glucose was 73 with a BUN of 28, creatinine of 0.98 with a sodium of 141, and a potassium of 3.2.   ASSESSMENT:  1.  Acute on chronic respiratory failure requiring bilevel positive airway pressure.  2.  Chronic obstructive pulmonary disease exacerbation.  3.  Presumed bronchitis.  4.  Atherosclerotic cardiovascular disease.  5.  Hypothyroidism.  6.  Anxiety/depression.   PLAN: The patient will be admitted to the Intensive Care Unit on BiPAP as a FULL CODE according to her previous wishes. We will consult pulmonology and palliative care. We will begin potassium supplementation in her IV fluids. We will begin IV antibiotics with DuoNeb SVNs and IV steroids. We will wean off BiPAP as tolerated. Follow up routine labs in the morning. We will also consult social work for possible placement. Further treatment and evaluation will depend upon the patient's progress.   TOTAL TIME SPENT ON THIS PATIENT: 50 minutes.     ____________________________ Duane LopeJeffrey D. Judithann SheenSparks, MD jds:ts D: 02/03/2014 19:26:47 ET T: 02/03/2014 21:35:11 ET JOB#: [id number]  cc: Duane LopeJeffrey D. Judithann SheenSparks, MD, <Dictator> [name], MD Hila Bolding Rodena Medin Remiel Corti MD ELECTRONICALLY SIGNED 02/04/2014 14:15

## 2014-05-13 NOTE — Discharge Summary (Signed)
PATIENT NAME:  Samantha Goodman, Samantha Goodman MR#:  [id number] DATE OF BIRTH:  Jun 04, 1935  ADMITTING DIAGNOSIS: Chronic obstructive pulmonary disease exacerbation.   DISCHARGE DIAGNOSES: 1.  Acute on chronic respiratory failure with hypoxia and hypercapnia.  2.  Chronic obstructive pulmonary disease exacerbation.  3.  Acute bronchitis.  4. Questionable ongoing tobacco abuse. The patient claims she quit.  5.  Leukocytosis.  6.  Anemia.  7.  History of anxiety, depression, hypothyroidism, coronary artery disease, status post stent  placement in remote past.  8.  Chronic respiratory failure, on 2 liters of oxygen per nasal cannula at home.  9.  Hyperlipidemia.  DISCHARGE CONDITION: Stable.   DISCHARGE MEDICATIONS: The patient is to continue: 1.  ProAir HFA 2 puffs 4 times daily as needed. 2.  Plavix 75 mg p.o. daily. 3.  Tylenol 500 mg p.o. twice daily as needed.  4.  Citalopram 20 mg p.o. daily. 5.  Levothyroxine 88 mcg p.o. daily.  6.  Iron sulfate 325 mg p.o. daily.  7.  Pantoprazole 40 mg p.o. daily.  8.  Metoprolol succinate 12.5 mg p.o. daily.  9.  Atorvastatin 40 mg p.o. daily.  10.  Theophylline 200 mg p.o. twice daily. 11.   Symbicort 160/4.5, 2 puffs twice daily.  12.  Aspirin 81 mg p.o. daily.  13.  Loratadine 10 mg p.o. daily.  14.  Lorazepam 0.5 mg 1/2 tablet every 6 hours as needed.  15.  Vitamin D3, 1000 units oral capsule once daily.  16.  Daliresp 500 mcg p.o. daily.  17.  Montelukast 20 mg p.o. daily. 18.  Acetylcysteine inhalation solution 3 mL twice daily to be used with bronchodilators, albuterol nebulizers.  19.  DuoNeb 2.5/0.5 mg in 3 mL inhalation solution 4 times daily as needed.  20.  Furosemide 20 mg p.o. daily.  21.  Mucinex 600 mg p.o. 3 times daily.  22.  Prednisone taper 50 mg p.o. once on 02/07/2014, then taper by 10 mg daily until stopped.  23.  Zithromax 500 mg p.o. once daily for the next 2 days.  24.  Fluticasone nasal spray 2 sprays once a day.   Home  health, physical therapy, as well as nurse will be arranged for her upon discharge. Home oxygen with portable tank at 2 liters of oxygen through nasal cannula.   DIET: Two-gram salt, low fat, low cholesterol, regular consistency.   ACTIVITY LIMITATIONS: As tolerated.   FOLLOWUP APPOINTMENT: With Dr. Elease Hashimoto in 2 days after discharge. The patient is also to follow up with COPD Gold program.   CONSULTANTS: Care management, social work, Mr. Daryl Eastern. Borders, palliative care, Herbon E. Meredeth Ide, MD, pulmonary care.   RADIOLOGIC STUDIES: Chest x-ray, portable, single view, 02/03/2014, revealing basilar opacities similar to prior exam, although pneumonia is possible, the appearance was most suggestive of atelectasis, probable small associated pleural effusions, underlying COPD and no pulmonary edema. Repeated chest x-ray, PA and lateral, 02/06/2014 showed stable bibasilar opacities consistent with subsegmental atelectasis or possibly scarring.   HISTORY OF PRESENT ILLNESS: The patient is a 79 year old Caucasian female with history of chronic respiratory failure, who presents to the hospital with complaints of shortness of breath. Apparently, patient was brought with shortness of breath by home health nurse and she was coughing and was profoundly tachypneic. In the Emergency Room, she required BiPAP initially. Her initial vital signs revealed that her blood pressure was 105/55, heart rate was 78, respiration rate was 22, temperature was 97.9, and oxygenation was 96% on BiPAP.  Physical exam revealed diffuse wheezes as well as rhonchi on lung exam. EKG showed sinus rhythm with no acute ischemic changes. Patient's chest x-ray showed no pneumonia.   LABORATORY DATA: Done on arrival to the hospital, 02/03/2014, revealed a beta-type natriuretic peptide of 263, BUN of 28, potassium of 3.2, bicarbonate level elevated at 36, otherwise BMP was unremarkable. Liver enzymes were normal. Cardiac enzymes were normal.  The patient's CBC, white blood cell count was elevated to 16.3, hemoglobin was 11.5, platelet count was 459,000. Blood cultures taken on 02/03/2014, did not show any growth. Coagulation panel was unremarkable, pH of venous blood was 7.53, pCO2 was 47, pO2 was 2.0.   The patient's EKG showed sinus rhythm at 86 beats per minute and nonspecific ST-T changes. Chest x-ray, as mentioned above, was unremarkable.   HOSPITAL COURSE The patient was admitted to the hospital for further evaluation. She was initiated on broad-spectrum antibiotic therapy with Rocephin as well as Zithromax. She was also given steroids, inhalation therapy as well as nebulizers. With this, her condition significantly improved over a period of few days. Her breathing improved and her wheezing also resolved. She was weaned down from 4 liters on arrival to the hospital to 3 liters of oxygen through nasal cannula on the day of discharge. The patient was advised to continue antibiotics as well as steroid taper. She is also to continue inhalation therapy and nebulizers. She was seen by palliative care nurse practitioner Mr. Laurette SchimkeJoshua Borders, who discussed patient's care. The patient's daughter apparently voiced concerns with noncompliance with patient's medications. Apparently, according to patient's daughter, the patient still smokes, although in the hospital she told me that she did not.  She also was given BiPAP apparently at home, but she refused to wear it and did not take her medications as ordered. The patient was encouraged to continue her medications as prescribed and she is going to be discharged home with hospice.   She was also seen by pulmonologist, Dr. Freda MunroSaadat Khan, who followed her along. The patient may benefit from follow up with Dr. Freda MunroSaadat Khan as outpatient as well.   On the day of discharge, she felt satisfactory, did not complain of any significant discomfort. Her vitals were stable. Temperature was 98.5, pulse was 81,  respiration rate was 18, blood pressure 100-113 systolic and 60s diastolic. Oxygen saturation was 93%-96% on 3-4 liters of oxygen through nasal cannula at rest. We attempted to obtain patient's sputum; however, no sputum was obtained, but since patient clinically improved, it was felt that she was safe to be discharged home to finish her antibiotic therapy at home. In regard to leukocytosis, the patient's white blood cell count normalized as time progressed. In regards to anemia, patient's anemia remained stable. For her chronic medical problems such as anxiety, depression, hypothyroidism, coronary artery disease, the patient is to continue her outpatient medications, no changes were made. The patient is being discharged in stable condition with the above-mentioned medications and followup.   TIME SPENT: 40 minutes.    ____________________________ Katharina Caperima Carter Kaman, MD rv:LT D: 02/06/2014 18:34:36 ET T: 02/06/2014 19:58:59 ET JOB#: [id number]  cc: Katharina Caperima Sumaya Riedesel, MD, <Dictator> [name], MD Skyrah Krupp MD ELECTRONICALLY SIGNED 02/18/2014 15:50

## 2014-05-13 NOTE — H&P (Signed)
PATIENT NAME:  Samantha Goodman, Samantha Goodman MR#:  [id number] DATE OF BIRTH:  May 05, 1935  DATE OF ADMISSION:  02/23/2014  REFERRING EMERGENCY ROOM PHYSICIAN: Dierdre Highman, MD   PRIMARY CARE PHYSICIAN:  Leo Grosser, MD   PRIMARY PULMONOLOGIST:  Welton Flakes, MD   CHIEF COMPLAINT: Shortness of breath.   HISTORY OF PRESENT ILLNESS: This 79 year old woman with past medical history of COPD, oxygen dependent on 2 liters at home, frequent readmissions 7 over the past 6 months; hypothyroidism, coronary artery disease, anxiety and depression presents with shortness of breath starting this morning. She reports that she was in her normal state of health last night. Upon waking this morning she found that she was very short of breath and having difficulty breathing. Her home health nurse came to visit her this morning and found that she was hypoxic, with saturations in the low 80s, on her usual 2 liters nasal cannula. She denies fevers or chills. She does have a cough, which is productive of white or clear sputum. No hemoptysis. She reports that she has not been smoking cigarettes. She has been compliant with all of her medications. She does not yet have a CPAP machine. She has sleep study scheduled in the near future with Dr. Welton Flakes. She does mention that she thinks that most of her exacerbations start in the morning after feeling well the day before.   In the Emergency Room, her chest x-ray is not worrisome for pneumonia; her oxygenation is good after nebulizer treatments in the 90s, on 4 liters nasal cannula. She continues to have a very increased respiratory rate and work of breathing.   PAST MEDICAL HISTORY: 1.  COPD on 2 liters nasal cannula at home.  2.  Hypothyroidism.  3.  Recurrent pneumonia.  4.  Anxiety and depression.  5.  Hyperlipidemia.  6.  Ongoing tobacco abuse.  7.  Coronary artery disease, status post PCI with stent placements.   PAST SURGICAL HISTORY:  1.  Hysterectomy.  2.  Right and left ocular  implants.  3.  Coronary artery stenting.  4.  Left ankle surgery.   SOCIAL HISTORY: The patient lives by herself in her own apartment. There is some confusion within the family, the grand daughter has offered to have the patient live with her but the patient has refused so far. She has on previous admissions, there has been concern from the family that the patient continues to smoke cigarettes and is not compliant with her medications. According to the patient she has quit smoking. She does not drink alcohol, and she is compliant with her medications.   FAMILY MEDICAL HISTORY: Positive for coronary artery disease, stroke and hypertension.   REVIEW OF SYSTEMS: CONSTITUTIONAL: Negative for fever, fatigue, weakness, or pain.  HEENT: No change in vision or hearing. No pain in the eyes or ears. No sinus congestion or postnasal drip. No sore throat or difficulty swallowing.  RESPIRATORY: Positive for cough, wheezing. No hemoptysis; positive for dyspnea, COPD, no painful respirations.  CARDIOVASCULAR: No chest pain, orthopnea, or edema. No arrhythmia or palpitations. No syncope.  GASTROINTESTINAL: No nausea, vomiting, diarrhea, or abdominal pain.  GENITOURINARY:  No dysuria or frequency.  ENDOCRINE: No polyuria or polydipsia. No hot or cold intolerance.  SKIN: No new rashes or lesions.  MUSCULOSKELETAL: No new pain in the neck, back, shoulders, knees, or hips. No gout.  NEUROLOGIC: No focal numbness or weakness; no confusion, no dementia, no seizure.  PSYCHIATRIC: No bipolar disorder or schizophrenia. She does have anxiety,  which may be contributing to her respiratory symptoms.   HOME MEDICATIONS: 1.  Vitamin D3 of 1000 units 1 capsule once a day in the morning.  2.  Tylenol 500 mg 1 tablet 2 times a day as needed for pain.  3.  Theophylline 200 mg 1 tablet twice a day.  4.  Symbicort 160 mcg/4.5 mcg per inhalation 2 puffs inhaled twice a day.  5.  ProAir HFA CFC free 90 mcg per inhalation 2 puffs  inhaled 4 times a day.  6.  Prednisone 10 mg 6 tablets once a day, tapering dose, was prescribed at discharge on 01/26, and should be completed by this time.  7.  Pantoprazole 40 mg 1 tablet once a day.  8.  Mucinex 600 mg 1 tablet 3 times a day as needed for cough.  9.  Montelukast 10 mg 1 tablet once a day.  10.  Metoprolol succinate 25 mg 0.5 tablets once a day.  11.  Lorazepam 0.5 mg 0.5 tablets every 6 hours as needed for anxiety.  12.  Loratadine 10 mg 1 tablet once a day.  13.  Levothyroxine 88 mcg 1 tablet once a day.  14.  Furosemide 20 mg 2 tablets once a day in the morning.  15.  Fluticasone nasal spray 50 mcg per inhalation 1 spray to each nostril once a day in the morning.  16.  Ferrous sulfate 325 mg 1 tablet once a day.  17.  Daliresp 500 mcg 1 tablet once a day in the morning.  18.  Clopidogrel 75 mg 1 tablet once a day.  19.  Citalopram 20 mg 1 tablet once a day.  20.  Atorvastatin 40 mg 1 tablet once a day.  21.  Aspirin 81 mg 1 tablet once a day.  22.  Albuterol ipratropium 2.5 mg/0.5 mg/3 mL three mL 4 times a day as needed for shortness of breath.  23.  Acetylcysteine 20% inhalation solution 3 mL inhaled 4 times a day as needed for shortness of breath.   PHYSICAL EXAMINATION: VITAL SIGNS: Temperature 98.1, respirations 20, blood pressure 117/52, pulse 84, oxygenation 100% on 4 liters nasal cannula.  GENERAL: The patient is in respiratory distress breathing fairly hard and uncomfortably.  HEENT: Pupils equal, round, and reactive to light; conjunctivae clear, extraocular motion intact, oral mucous membranes are pink and moist, posterior oropharynx clear.  NECK: Trachea midline, no cervical lymphadenopathy, thyroid nontender.  RESPIRATORY: There are diffuse loud wheezes and rhonchi throughout all lung fields with poor air movement.  CARDIOVASCULAR: Regular rate and rhythm, no murmurs, rubs, or gallops, distant heart sounds, no peripheral edema, peripheral pulses are 1+.   ABDOMEN: Soft, nontender, nondistended, bowel sounds are normal, no guarding, no rebound, no hepatosplenomegaly.  SKIN: No rash, no open wounds or lesions.  MUSCULOSKELETAL: No joint effusions, range of motion normal.  EXTREMITIES: Move x 4.  NEUROLOGIC: Cranial nerves II through XII grossly intact, strength and sensation intact.  PSYCHIATRIC: The patient alert and oriented with good insight into her clinical condition.   LABORATORY: Sodium 144, potassium 2.9, chloride 101, bicarbonate 35, BUN 26, creatinine 0.94, glucose 119, magnesium 2.2, lipase 216; LFTs normal, decreased albumin at 3.2, troponin less than 0.02, white blood cells 7.6, hemoglobin 9.2, platelets 273,000, MCV is 84, INR is 0.9; UA is negative for infection, but positive for hematuria.   IMAGING: Chest x-ray shows no definite acute cardiopulmonary disease, unchanged bibasilar opacities, right greater than left.   ASSESSMENT AND PLAN: 1.  Acute on  chronic obstructive pulmonary disease exacerbation: No obvious triggers, she reports no smoking, compliance with all medications, no recent fevers or chills, and chest x-ray is clear of pneumonia. We will start standard regimen of intravenous Solu-Medrol, nebulizer treatments, continue home inhalers, she is a chronic obstructive pulmonary disease gold patient so pulmonary will be consulted; her primary pulmonologist is Dr. Khan; note that most of her chronic obstructive pulmonary diseWelton Flakesase exacerbations seem to start in the morning and she does have a sleep study planned. I will start continuous positive airway pressure while she is here.  2.  Suspected obstructive sleep apnea:  Continuous positive airway pressure in hospital him; we will need to be sure she has a sleep study scheduled for discharge.  3.  Hypokalemia: Repleted and recheck, magnesium normal.  4.  Anemia: Hemoglobin today is lower than prior values; her baseline seems to be about 10.9, today she is 9.2; there has been no  observed bleeding. We will continue to monitor. She is on iron supplements at home.  5.  Hematuria: No signs of urinary tract infection. The significance of this hematuria is unknown. I will repeat the urinalysis to verify.  6.  DO NOT RESUSCITATE. I have discussed resuscitation with the patient during the admission process and she states that she does not wish for cardiopulmonary resuscitation particularly not for intubation. She is aware that if placed on the ventilator it would be very difficult for her to come off of it.   Time spent on admission was 40 minutes.    ____________________________ Ena Dawleyatherine P. Clent RidgesWalsh, MD cpw:nt D: 02/23/2014 15:49:05 ET T: 02/23/2014 16:06:25 ET JOB#: [id number]  cc: Santina Evansatherine P. Clent RidgesWalsh, MD, <Dictator> Gale JourneyATHERINE P Chantilly Linskey MD ELECTRONICALLY SIGNED 03/01/2014 23:21

## 2014-05-15 DIAGNOSIS — J441 Chronic obstructive pulmonary disease with (acute) exacerbation: Secondary | ICD-10-CM | POA: Diagnosis not present

## 2014-05-15 DIAGNOSIS — D649 Anemia, unspecified: Secondary | ICD-10-CM | POA: Diagnosis not present

## 2014-05-15 DIAGNOSIS — I2510 Atherosclerotic heart disease of native coronary artery without angina pectoris: Secondary | ICD-10-CM | POA: Diagnosis not present

## 2014-05-15 DIAGNOSIS — E876 Hypokalemia: Secondary | ICD-10-CM | POA: Diagnosis not present

## 2014-05-15 DIAGNOSIS — F329 Major depressive disorder, single episode, unspecified: Secondary | ICD-10-CM | POA: Diagnosis not present

## 2014-05-15 DIAGNOSIS — G4733 Obstructive sleep apnea (adult) (pediatric): Secondary | ICD-10-CM | POA: Diagnosis not present

## 2014-05-15 DIAGNOSIS — F418 Other specified anxiety disorders: Secondary | ICD-10-CM | POA: Diagnosis not present

## 2014-05-15 DIAGNOSIS — J440 Chronic obstructive pulmonary disease with acute lower respiratory infection: Secondary | ICD-10-CM | POA: Diagnosis not present

## 2014-05-15 DIAGNOSIS — Z9981 Dependence on supplemental oxygen: Secondary | ICD-10-CM | POA: Diagnosis not present

## 2014-05-15 DIAGNOSIS — J209 Acute bronchitis, unspecified: Secondary | ICD-10-CM | POA: Diagnosis not present

## 2014-05-15 DIAGNOSIS — J9620 Acute and chronic respiratory failure, unspecified whether with hypoxia or hypercapnia: Secondary | ICD-10-CM | POA: Diagnosis not present

## 2014-05-15 DIAGNOSIS — F17210 Nicotine dependence, cigarettes, uncomplicated: Secondary | ICD-10-CM | POA: Diagnosis not present

## 2014-05-21 DIAGNOSIS — I509 Heart failure, unspecified: Secondary | ICD-10-CM | POA: Diagnosis not present

## 2014-05-21 DIAGNOSIS — J449 Chronic obstructive pulmonary disease, unspecified: Secondary | ICD-10-CM | POA: Diagnosis not present

## 2014-05-22 DIAGNOSIS — G4733 Obstructive sleep apnea (adult) (pediatric): Secondary | ICD-10-CM | POA: Diagnosis not present

## 2014-05-22 DIAGNOSIS — F17210 Nicotine dependence, cigarettes, uncomplicated: Secondary | ICD-10-CM | POA: Diagnosis not present

## 2014-05-22 DIAGNOSIS — I2510 Atherosclerotic heart disease of native coronary artery without angina pectoris: Secondary | ICD-10-CM | POA: Diagnosis not present

## 2014-05-22 DIAGNOSIS — D649 Anemia, unspecified: Secondary | ICD-10-CM | POA: Diagnosis not present

## 2014-05-22 DIAGNOSIS — Z9981 Dependence on supplemental oxygen: Secondary | ICD-10-CM | POA: Diagnosis not present

## 2014-05-22 DIAGNOSIS — F418 Other specified anxiety disorders: Secondary | ICD-10-CM | POA: Diagnosis not present

## 2014-05-22 DIAGNOSIS — J441 Chronic obstructive pulmonary disease with (acute) exacerbation: Secondary | ICD-10-CM | POA: Diagnosis not present

## 2014-05-22 DIAGNOSIS — J440 Chronic obstructive pulmonary disease with acute lower respiratory infection: Secondary | ICD-10-CM | POA: Diagnosis not present

## 2014-05-22 DIAGNOSIS — J9620 Acute and chronic respiratory failure, unspecified whether with hypoxia or hypercapnia: Secondary | ICD-10-CM | POA: Diagnosis not present

## 2014-05-22 DIAGNOSIS — J209 Acute bronchitis, unspecified: Secondary | ICD-10-CM | POA: Diagnosis not present

## 2014-05-22 DIAGNOSIS — F329 Major depressive disorder, single episode, unspecified: Secondary | ICD-10-CM | POA: Diagnosis not present

## 2014-05-22 DIAGNOSIS — E876 Hypokalemia: Secondary | ICD-10-CM | POA: Diagnosis not present

## 2014-05-23 DIAGNOSIS — G4733 Obstructive sleep apnea (adult) (pediatric): Secondary | ICD-10-CM | POA: Diagnosis not present

## 2014-05-23 DIAGNOSIS — D649 Anemia, unspecified: Secondary | ICD-10-CM | POA: Diagnosis not present

## 2014-05-23 DIAGNOSIS — F418 Other specified anxiety disorders: Secondary | ICD-10-CM | POA: Diagnosis not present

## 2014-05-23 DIAGNOSIS — J440 Chronic obstructive pulmonary disease with acute lower respiratory infection: Secondary | ICD-10-CM | POA: Diagnosis not present

## 2014-05-23 DIAGNOSIS — F17210 Nicotine dependence, cigarettes, uncomplicated: Secondary | ICD-10-CM | POA: Diagnosis not present

## 2014-05-23 DIAGNOSIS — F329 Major depressive disorder, single episode, unspecified: Secondary | ICD-10-CM | POA: Diagnosis not present

## 2014-05-23 DIAGNOSIS — E876 Hypokalemia: Secondary | ICD-10-CM | POA: Diagnosis not present

## 2014-05-23 DIAGNOSIS — Z9981 Dependence on supplemental oxygen: Secondary | ICD-10-CM | POA: Diagnosis not present

## 2014-05-23 DIAGNOSIS — J441 Chronic obstructive pulmonary disease with (acute) exacerbation: Secondary | ICD-10-CM | POA: Diagnosis not present

## 2014-05-23 DIAGNOSIS — J9620 Acute and chronic respiratory failure, unspecified whether with hypoxia or hypercapnia: Secondary | ICD-10-CM | POA: Diagnosis not present

## 2014-05-23 DIAGNOSIS — I2510 Atherosclerotic heart disease of native coronary artery without angina pectoris: Secondary | ICD-10-CM | POA: Diagnosis not present

## 2014-05-23 DIAGNOSIS — J209 Acute bronchitis, unspecified: Secondary | ICD-10-CM | POA: Diagnosis not present

## 2014-05-29 DIAGNOSIS — F17210 Nicotine dependence, cigarettes, uncomplicated: Secondary | ICD-10-CM | POA: Diagnosis not present

## 2014-05-29 DIAGNOSIS — J440 Chronic obstructive pulmonary disease with acute lower respiratory infection: Secondary | ICD-10-CM | POA: Diagnosis not present

## 2014-05-29 DIAGNOSIS — Z9981 Dependence on supplemental oxygen: Secondary | ICD-10-CM | POA: Diagnosis not present

## 2014-05-29 DIAGNOSIS — J441 Chronic obstructive pulmonary disease with (acute) exacerbation: Secondary | ICD-10-CM | POA: Diagnosis not present

## 2014-05-29 DIAGNOSIS — F329 Major depressive disorder, single episode, unspecified: Secondary | ICD-10-CM | POA: Diagnosis not present

## 2014-05-29 DIAGNOSIS — F418 Other specified anxiety disorders: Secondary | ICD-10-CM | POA: Diagnosis not present

## 2014-05-29 DIAGNOSIS — D649 Anemia, unspecified: Secondary | ICD-10-CM | POA: Diagnosis not present

## 2014-05-29 DIAGNOSIS — G4733 Obstructive sleep apnea (adult) (pediatric): Secondary | ICD-10-CM | POA: Diagnosis not present

## 2014-05-29 DIAGNOSIS — J209 Acute bronchitis, unspecified: Secondary | ICD-10-CM | POA: Diagnosis not present

## 2014-05-29 DIAGNOSIS — J9620 Acute and chronic respiratory failure, unspecified whether with hypoxia or hypercapnia: Secondary | ICD-10-CM | POA: Diagnosis not present

## 2014-05-29 DIAGNOSIS — E876 Hypokalemia: Secondary | ICD-10-CM | POA: Diagnosis not present

## 2014-05-29 DIAGNOSIS — I2510 Atherosclerotic heart disease of native coronary artery without angina pectoris: Secondary | ICD-10-CM | POA: Diagnosis not present

## 2014-05-30 DIAGNOSIS — J9690 Respiratory failure, unspecified, unspecified whether with hypoxia or hypercapnia: Secondary | ICD-10-CM | POA: Diagnosis not present

## 2014-05-30 DIAGNOSIS — R0902 Hypoxemia: Secondary | ICD-10-CM | POA: Diagnosis not present

## 2014-05-30 DIAGNOSIS — J209 Acute bronchitis, unspecified: Secondary | ICD-10-CM | POA: Diagnosis not present

## 2014-06-05 DIAGNOSIS — Z9981 Dependence on supplemental oxygen: Secondary | ICD-10-CM | POA: Diagnosis not present

## 2014-06-05 DIAGNOSIS — D649 Anemia, unspecified: Secondary | ICD-10-CM | POA: Diagnosis not present

## 2014-06-05 DIAGNOSIS — F329 Major depressive disorder, single episode, unspecified: Secondary | ICD-10-CM | POA: Diagnosis not present

## 2014-06-05 DIAGNOSIS — F418 Other specified anxiety disorders: Secondary | ICD-10-CM | POA: Diagnosis not present

## 2014-06-05 DIAGNOSIS — I2510 Atherosclerotic heart disease of native coronary artery without angina pectoris: Secondary | ICD-10-CM | POA: Diagnosis not present

## 2014-06-05 DIAGNOSIS — E876 Hypokalemia: Secondary | ICD-10-CM | POA: Diagnosis not present

## 2014-06-05 DIAGNOSIS — J209 Acute bronchitis, unspecified: Secondary | ICD-10-CM | POA: Diagnosis not present

## 2014-06-05 DIAGNOSIS — G4733 Obstructive sleep apnea (adult) (pediatric): Secondary | ICD-10-CM | POA: Diagnosis not present

## 2014-06-05 DIAGNOSIS — J441 Chronic obstructive pulmonary disease with (acute) exacerbation: Secondary | ICD-10-CM | POA: Diagnosis not present

## 2014-06-05 DIAGNOSIS — F17210 Nicotine dependence, cigarettes, uncomplicated: Secondary | ICD-10-CM | POA: Diagnosis not present

## 2014-06-05 DIAGNOSIS — J9620 Acute and chronic respiratory failure, unspecified whether with hypoxia or hypercapnia: Secondary | ICD-10-CM | POA: Diagnosis not present

## 2014-06-05 DIAGNOSIS — J440 Chronic obstructive pulmonary disease with acute lower respiratory infection: Secondary | ICD-10-CM | POA: Diagnosis not present

## 2014-06-07 DIAGNOSIS — J449 Chronic obstructive pulmonary disease, unspecified: Secondary | ICD-10-CM | POA: Diagnosis not present

## 2014-06-11 DIAGNOSIS — J440 Chronic obstructive pulmonary disease with acute lower respiratory infection: Secondary | ICD-10-CM | POA: Diagnosis not present

## 2014-06-11 DIAGNOSIS — J209 Acute bronchitis, unspecified: Secondary | ICD-10-CM | POA: Diagnosis not present

## 2014-06-11 DIAGNOSIS — J9620 Acute and chronic respiratory failure, unspecified whether with hypoxia or hypercapnia: Secondary | ICD-10-CM | POA: Diagnosis not present

## 2014-06-11 DIAGNOSIS — F418 Other specified anxiety disorders: Secondary | ICD-10-CM | POA: Diagnosis not present

## 2014-06-11 DIAGNOSIS — F17210 Nicotine dependence, cigarettes, uncomplicated: Secondary | ICD-10-CM | POA: Diagnosis not present

## 2014-06-11 DIAGNOSIS — F329 Major depressive disorder, single episode, unspecified: Secondary | ICD-10-CM | POA: Diagnosis not present

## 2014-06-11 DIAGNOSIS — E876 Hypokalemia: Secondary | ICD-10-CM | POA: Diagnosis not present

## 2014-06-11 DIAGNOSIS — G4733 Obstructive sleep apnea (adult) (pediatric): Secondary | ICD-10-CM | POA: Diagnosis not present

## 2014-06-11 DIAGNOSIS — D649 Anemia, unspecified: Secondary | ICD-10-CM | POA: Diagnosis not present

## 2014-06-11 DIAGNOSIS — I2510 Atherosclerotic heart disease of native coronary artery without angina pectoris: Secondary | ICD-10-CM | POA: Diagnosis not present

## 2014-06-11 DIAGNOSIS — Z9981 Dependence on supplemental oxygen: Secondary | ICD-10-CM | POA: Diagnosis not present

## 2014-06-11 DIAGNOSIS — J441 Chronic obstructive pulmonary disease with (acute) exacerbation: Secondary | ICD-10-CM | POA: Diagnosis not present

## 2014-06-18 ENCOUNTER — Telehealth: Payer: Self-pay | Admitting: Family Medicine

## 2014-06-18 NOTE — Telephone Encounter (Signed)
Talked with Bonita QuinLinda.  Recommend reassess tomorrow.

## 2014-06-18 NOTE — Telephone Encounter (Signed)
Samantha Goodman states her 02 per pt this morning was 98 %, she doesn't report any increase of SOB but stats stay anywhere from 91% to 95% during visit on her oxygen.  Lung sounds are different, she is diminished in left lobes, normally Samantha Goodman hears air movement and crackles.  Increased swelling in legs, coughing up same material as normal, Samantha Goodman is unknown of CHF history and hesitant to ask for Prednisone taper without seeing her in office or having chest x-ray.  Please contact Samantha [facility] with [facility] with recommendation.

## 2014-06-19 ENCOUNTER — Telehealth: Payer: Self-pay | Admitting: Family Medicine

## 2014-06-19 NOTE — Telephone Encounter (Signed)
Samantha Goodman with [facility] called to give you an update.  Lungs are back to base line today, stats improved, edema down and do not a see a need for any changes in plan of care. Pt did have more sodium intake due to family bring in food.  Pt has been advised to get scales to due CHF.  Will continue to monitor/MJ

## 2014-06-27 ENCOUNTER — Telehealth: Payer: Self-pay | Admitting: Family Medicine

## 2014-06-27 NOTE — Telephone Encounter (Signed)
Barbara Cower from [facility] is asking that you add an addendum to your note form 06-07-14 to include 02 sats on room air and exertion

## 2014-06-27 NOTE — Telephone Encounter (Signed)
Think this was in there. Please check. Thanks.

## 2014-06-28 ENCOUNTER — Encounter (INDEPENDENT_AMBULATORY_CARE_PROVIDER_SITE_OTHER): Payer: Medicare Other | Admitting: Family Medicine

## 2014-06-28 DIAGNOSIS — J440 Chronic obstructive pulmonary disease with acute lower respiratory infection: Secondary | ICD-10-CM | POA: Diagnosis not present

## 2014-06-28 DIAGNOSIS — J9620 Acute and chronic respiratory failure, unspecified whether with hypoxia or hypercapnia: Secondary | ICD-10-CM

## 2014-06-28 DIAGNOSIS — G4733 Obstructive sleep apnea (adult) (pediatric): Secondary | ICD-10-CM

## 2014-06-28 DIAGNOSIS — J441 Chronic obstructive pulmonary disease with (acute) exacerbation: Secondary | ICD-10-CM | POA: Diagnosis not present

## 2014-07-03 NOTE — Telephone Encounter (Signed)
Barbara Cower has her records and states that it is not mentioned in her note

## 2014-07-04 ENCOUNTER — Other Ambulatory Visit: Payer: Self-pay | Admitting: Family Medicine

## 2014-07-04 DIAGNOSIS — D649 Anemia, unspecified: Secondary | ICD-10-CM | POA: Insufficient documentation

## 2014-07-04 NOTE — Telephone Encounter (Signed)
A little confused. Sats were 86  On 2 liters with and without walking. . Was not safe to do room air?   Please check into this Thanks.

## 2014-07-05 NOTE — Telephone Encounter (Signed)
Samantha Goodman states that he still needs the pt's O2 sats on room air with exertion even if it's for a brief sec

## 2014-07-06 DIAGNOSIS — E039 Hypothyroidism, unspecified: Secondary | ICD-10-CM | POA: Insufficient documentation

## 2014-07-06 DIAGNOSIS — F419 Anxiety disorder, unspecified: Secondary | ICD-10-CM | POA: Insufficient documentation

## 2014-07-06 DIAGNOSIS — K623 Rectal prolapse: Secondary | ICD-10-CM | POA: Insufficient documentation

## 2014-07-06 DIAGNOSIS — E7800 Pure hypercholesterolemia, unspecified: Secondary | ICD-10-CM | POA: Insufficient documentation

## 2014-07-06 DIAGNOSIS — E876 Hypokalemia: Secondary | ICD-10-CM | POA: Insufficient documentation

## 2014-07-06 DIAGNOSIS — K5909 Other constipation: Secondary | ICD-10-CM | POA: Insufficient documentation

## 2014-07-06 DIAGNOSIS — F17201 Nicotine dependence, unspecified, in remission: Secondary | ICD-10-CM | POA: Insufficient documentation

## 2014-07-06 DIAGNOSIS — J449 Chronic obstructive pulmonary disease, unspecified: Secondary | ICD-10-CM | POA: Insufficient documentation

## 2014-07-06 DIAGNOSIS — I2510 Atherosclerotic heart disease of native coronary artery without angina pectoris: Secondary | ICD-10-CM | POA: Insufficient documentation

## 2014-07-06 DIAGNOSIS — R42 Dizziness and giddiness: Secondary | ICD-10-CM | POA: Insufficient documentation

## 2014-07-06 DIAGNOSIS — J309 Allergic rhinitis, unspecified: Secondary | ICD-10-CM | POA: Insufficient documentation

## 2014-07-06 DIAGNOSIS — R600 Localized edema: Secondary | ICD-10-CM | POA: Insufficient documentation

## 2014-07-06 DIAGNOSIS — M810 Age-related osteoporosis without current pathological fracture: Secondary | ICD-10-CM | POA: Insufficient documentation

## 2014-07-06 DIAGNOSIS — K922 Gastrointestinal hemorrhage, unspecified: Secondary | ICD-10-CM | POA: Insufficient documentation

## 2014-07-06 DIAGNOSIS — Z6823 Body mass index (BMI) 23.0-23.9, adult: Secondary | ICD-10-CM | POA: Insufficient documentation

## 2014-07-06 DIAGNOSIS — I509 Heart failure, unspecified: Secondary | ICD-10-CM | POA: Insufficient documentation

## 2014-07-09 ENCOUNTER — Other Ambulatory Visit: Payer: Self-pay | Admitting: Family Medicine

## 2014-07-09 DIAGNOSIS — K219 Gastro-esophageal reflux disease without esophagitis: Secondary | ICD-10-CM | POA: Insufficient documentation

## 2014-07-09 DIAGNOSIS — E7800 Pure hypercholesterolemia, unspecified: Secondary | ICD-10-CM

## 2014-07-09 DIAGNOSIS — R600 Localized edema: Secondary | ICD-10-CM

## 2014-07-13 ENCOUNTER — Ambulatory Visit (INDEPENDENT_AMBULATORY_CARE_PROVIDER_SITE_OTHER): Payer: Medicare Other | Admitting: Family Medicine

## 2014-07-13 ENCOUNTER — Encounter: Payer: Self-pay | Admitting: Family Medicine

## 2014-07-13 VITALS — BP 142/76 | HR 90 | Temp 97.7°F | Resp 30 | Ht 65.5 in | Wt 150.0 lb

## 2014-07-13 DIAGNOSIS — D692 Other nonthrombocytopenic purpura: Secondary | ICD-10-CM | POA: Diagnosis not present

## 2014-07-13 DIAGNOSIS — I509 Heart failure, unspecified: Secondary | ICD-10-CM | POA: Diagnosis not present

## 2014-07-13 DIAGNOSIS — J449 Chronic obstructive pulmonary disease, unspecified: Secondary | ICD-10-CM

## 2014-07-13 DIAGNOSIS — Z9981 Dependence on supplemental oxygen: Secondary | ICD-10-CM | POA: Diagnosis not present

## 2014-07-13 NOTE — Progress Notes (Signed)
Subjective:    Patient ID: Samantha Goodman, female    DOB: August 16, 1935, 79 y.o.   MRN: [id number]  HPI   COPD, Follow up:  She was last seen for this 2 months ago. Changes made include restarting Prednisone. Oxygen at the time was 86 on 2 liters.    She reports excellent compliance with treatment. She is not having side effects.  she is using rescue inhaler. Typically 3 per days. She IS experiencing dyspnea, cough and wheezing. She is NOT experiencing fatigue, weight increase, weight loss, chills, fever, increased sputum or colored sputum. she report breathing is Unchanged. Has her oxygen with her today.  Has  To change companies.   Really needs her oxygen. Does get very SOB without it.  Resting pulse ox is ok today at 91 percent, but with tachypnea.    Worsens with walking and sats drop to 87 percent. Oxygen restarted with decrease in respiratory rate.    Grandson commented on her arm bruising. Knows related to her steroid use.   ------------------------------------------------------------------------    Review of Systems  Constitutional: Negative.   Respiratory: Positive for cough, chest tightness, shortness of breath and wheezing.   Cardiovascular: Negative for chest pain, palpitations and leg swelling.  Hematological: Bruises/bleeds easily.   BP 142/76 mmHg  Pulse 90  Temp(Src) 97.7 F (36.5 C) (Oral)  Resp 28  Ht 5' 5.5" (1.664 m)  Wt 150 lb (68.04 kg)  BMI 24.57 kg/m2  SpO2 91%  Patient Active Problem List   Diagnosis Date Noted  . Acid reflux 07/09/2014  . Allergic rhinitis 07/06/2014  . Anxiety 07/06/2014  . Body mass index (BMI) of 23.0-23.9 in adult 07/06/2014  . CCF (congestive cardiac failure) 07/06/2014  . CAFL (chronic airflow limitation) 07/06/2014  . Chronic constipation 07/06/2014  . Atherosclerosis of coronary artery 07/06/2014  . Dizziness 07/06/2014  . Edema extremities 07/06/2014  . Bleeding gastrointestinal 07/06/2014  .  Hypercholesteremia 07/06/2014  . Decreased potassium in the blood 07/06/2014  . Adult hypothyroidism 07/06/2014  . OP (osteoporosis) 07/06/2014  . Procidentia of rectum 07/06/2014  . Compulsive tobacco user syndrome 07/06/2014  . Anemia 07/04/2014  . Acute blood loss anemia 05/13/2013  . Agitated 04/26/2013  . Acute exacerbation of chronic obstructive airways disease 04/23/2013  . Arteriosclerosis of coronary artery 03/09/2013  . Accelerating angina 03/09/2013   No past medical history on file. Current Outpatient Prescriptions on File Prior to Visit  Medication Sig  . acetylcysteine (MUCOMYST) 20 % nebulizer solution   . atorvastatin (LIPITOR) 40 MG tablet take 1 tablet by mouth every evening  . citalopram (CELEXA) 20 MG tablet   . clopidogrel (PLAVIX) 75 MG tablet Take 75 mg by mouth at bedtime.  . ferrous sulfate 325 (65 FE) MG tablet take 1 tablet by mouth every morning  . fluticasone (FLONASE) 50 MCG/ACT nasal spray Place into the nose.  . furosemide (LASIX) 20 MG tablet   . ipratropium-albuterol (DUONEB) 0.5-2.5 (3) MG/3ML SOLN   . levothyroxine (SYNTHROID, LEVOTHROID) 88 MCG tablet   . loratadine (CLARITIN) 10 MG tablet   . LORazepam (ATIVAN) 0.5 MG tablet Take by mouth.  . metoprolol succinate (TOPROL-XL) 25 MG 24 hr tablet Take 12.5 mg by mouth daily.  . montelukast (SINGULAIR) 10 MG tablet   . pantoprazole (PROTONIX) 40 MG tablet take 1 tablet by mouth every morning  . polyethylene glycol (MIRALAX / GLYCOLAX) packet   . PROAIR HFA 108 (90 BASE) MCG/ACT inhaler   . SYMBICORT 160-4.5 MCG/ACT  inhaler   . THEO-24 200 MG 24 hr capsule    No current facility-administered medications on file prior to visit.   Allergies  Allergen Reactions  . Codeine Nausea Only and Nausea And Vomiting  . Fluticasone-Salmeterol     Caused Pneumonia  . Levofloxacin     confusion, hallucinations  . Tiotropium Bromide Monohydrate Rash   No past surgical history on file. History   Social  History  . Marital Status: Widowed    Spouse Name: N/A  . Number of Children: N/A  . Years of Education: N/A   Occupational History  . Not on file.   Social History Main Topics  . Smoking status: Former Smoker -- 0.25 packs/day for 65 years    Quit date: 11/11/2013  . Smokeless tobacco: Never Used  . Alcohol Use: No  . Drug Use: No  . Sexual Activity: Not on file   Other Topics Concern  . Not on file   Social History Narrative  . No narrative on file   No family history on file.     Objective:   Physical Exam  Constitutional: She appears well-developed and well-nourished.  Cardiovascular: Normal rate and regular rhythm.   Pulmonary/Chest: Accessory muscle usage (when off oxygen) present. Tachypnea noted. She has wheezes (very coarse breath sounds.). She has rales.  Skin: Skin is warm and dry. Bruising (on forearms.) and ecchymosis noted.   BP 142/76 mmHg  Pulse 90  Temp(Src) 97.7 F (36.5 C) (Oral)  Resp 30  Ht 5' 5.5" (1.664 m)  Wt 150 lb (68.04 kg)  BMI 24.57 kg/m2  SpO2 87%         Assessment & Plan:  1. Senile purpura Noted at today's visit. Related to prednisone.   2. Chronic congestive heart failure, unspecified congestive heart failure type Stable. Continue medication and oxygen.  3. COPD mixed type Stable. Still does not sound good, with tachypnea off oxygen. At baseline.   4. Oxygen dependent Improved some from last visit, but still drops to 87 percent  when walking. Did not walk for long secondary to congestion and high respiratory rate with use of accessory muscles. Continue oxygen therapy.    Lorie PhenixNancy Bret Vanessen, MD

## 2014-07-18 ENCOUNTER — Other Ambulatory Visit: Payer: Self-pay | Admitting: Family Medicine

## 2014-07-18 DIAGNOSIS — J449 Chronic obstructive pulmonary disease, unspecified: Secondary | ICD-10-CM

## 2014-07-24 ENCOUNTER — Telehealth: Payer: Self-pay | Admitting: Family Medicine

## 2014-07-24 NOTE — Telephone Encounter (Signed)
Pt states she did get her oxygen today but they only left the big tanks.  Pt states she is needing a small portable oxygen tank to use if possible.   Pt states we need to fax a Rx to [facility].  EX#[phone]/NUCB#[phone]/MJ

## 2014-07-24 NOTE — Telephone Encounter (Signed)
Danyell from [facility] states pt is supposed to get portable tanks delivered 07-25-14

## 2014-08-12 ENCOUNTER — Other Ambulatory Visit: Payer: Self-pay | Admitting: Family Medicine

## 2014-08-13 NOTE — Telephone Encounter (Signed)
Future ov appointment is on 11/19/2014.

## 2014-08-22 ENCOUNTER — Telehealth: Payer: Self-pay | Admitting: Family Medicine

## 2014-08-22 DIAGNOSIS — J441 Chronic obstructive pulmonary disease with (acute) exacerbation: Secondary | ICD-10-CM | POA: Insufficient documentation

## 2014-08-22 MED ORDER — AZITHROMYCIN 250 MG PO TABS: ORAL_TABLET | ORAL | Status: DC

## 2014-08-22 MED ORDER — MONTELUKAST SODIUM 10 MG PO TABS: 10.0000 mg | ORAL_TABLET | Freq: Every day | ORAL | Status: DC

## 2014-08-22 MED ORDER — ONDANSETRON HCL 4 MG PO TABS: 4.0000 mg | ORAL_TABLET | Freq: Three times a day (TID) | ORAL | Status: DC | PRN

## 2014-08-22 NOTE — Telephone Encounter (Signed)
Yes. Thanks 

## 2014-08-22 NOTE — Telephone Encounter (Signed)
Please call Samantha Goodman back regarding Samantha Goodman has stopped using her inhaler.  It makes her nauseated  State dropping to 87.  94 at rest with deep breathing.  Can she adjust oxygen.  Thanks Barth Kirks

## 2014-08-22 NOTE — Telephone Encounter (Signed)
Bonita Quin, reports that pt has been wheezing and pt started Prednisone 10 mg  taper last night that she had left over. Per pt this is a full prescription that she never used. Bonita Quin would like to know if pt can continue prednisone, increase oxygen from 2L for a few days. Bonita Quin is also requesting refill on Singulair, antibiotic to be called into [facility]. Per Bonita Quin pt also has stopped taking her Mucomyst neb solution which makes her nauseated. Bonita Quin reports that Mucomyst has helped and would like something called into [facility] to help with nausea so that she can continue using Mucomyst. Please advise.

## 2014-08-22 NOTE — Telephone Encounter (Signed)
Sent antibiotic, nausea medication and Singulair to pharmacy.  Also, ok to increase oxygen as needed. Also have her cut the Citalopram in half while on Zpak.  Thanks.

## 2014-08-22 NOTE — Telephone Encounter (Signed)
Samantha Goodman as below. She was wanting to know if she can continue taking to prednisone taper? Please advise. Thanks!

## 2014-08-23 NOTE — Telephone Encounter (Signed)
Linda advised as directed below.   Thanks,   -Ane Conerly  

## 2014-09-05 ENCOUNTER — Other Ambulatory Visit: Payer: Self-pay | Admitting: Family Medicine

## 2014-09-05 DIAGNOSIS — J441 Chronic obstructive pulmonary disease with (acute) exacerbation: Secondary | ICD-10-CM | POA: Diagnosis not present

## 2014-09-05 DIAGNOSIS — I509 Heart failure, unspecified: Secondary | ICD-10-CM | POA: Diagnosis not present

## 2014-09-05 DIAGNOSIS — D649 Anemia, unspecified: Secondary | ICD-10-CM | POA: Diagnosis not present

## 2014-09-05 DIAGNOSIS — G4733 Obstructive sleep apnea (adult) (pediatric): Secondary | ICD-10-CM | POA: Diagnosis not present

## 2014-09-18 ENCOUNTER — Ambulatory Visit: Payer: Self-pay | Admitting: Family Medicine

## 2014-10-16 ENCOUNTER — Ambulatory Visit (INDEPENDENT_AMBULATORY_CARE_PROVIDER_SITE_OTHER): Payer: Medicare Other | Admitting: Family Medicine

## 2014-10-16 ENCOUNTER — Encounter: Payer: Self-pay | Admitting: Family Medicine

## 2014-10-16 VITALS — BP 110/60 | HR 72 | Temp 97.9°F | Resp 16 | Wt 150.0 lb

## 2014-10-16 DIAGNOSIS — E039 Hypothyroidism, unspecified: Secondary | ICD-10-CM | POA: Diagnosis not present

## 2014-10-16 DIAGNOSIS — J449 Chronic obstructive pulmonary disease, unspecified: Secondary | ICD-10-CM

## 2014-10-16 DIAGNOSIS — I509 Heart failure, unspecified: Secondary | ICD-10-CM | POA: Diagnosis not present

## 2014-10-16 DIAGNOSIS — D649 Anemia, unspecified: Secondary | ICD-10-CM

## 2014-10-16 DIAGNOSIS — E7800 Pure hypercholesterolemia, unspecified: Secondary | ICD-10-CM

## 2014-10-16 DIAGNOSIS — F419 Anxiety disorder, unspecified: Secondary | ICD-10-CM

## 2014-10-16 DIAGNOSIS — Z6823 Body mass index (BMI) 23.0-23.9, adult: Secondary | ICD-10-CM

## 2014-10-16 MED ORDER — FUROSEMIDE 20 MG PO TABS: 20.0000 mg | ORAL_TABLET | Freq: Every day | ORAL | Status: AC

## 2014-10-16 MED ORDER — CITALOPRAM HYDROBROMIDE 20 MG PO TABS: 20.0000 mg | ORAL_TABLET | Freq: Every day | ORAL | Status: DC

## 2014-10-16 NOTE — Progress Notes (Signed)
Patient ID: Samantha Goodman, female   DOB: May 25, 1935, 79 y.o.   MRN: [id number]         Patient: Samantha Goodman Female    DOB: [birth year]   79 y.o.   MRN: [id number] Visit Date: 10/16/2014  Today's Provider: Lorie Phenix, MD   Chief Complaint  Patient presents with  . COPD  . Hypertension  . Hyperlipidemia   Subjective:    Breathing Problem She complains of cough, difficulty breathing and wheezing. There is no hoarse voice or shortness of breath. This is a chronic problem. The cough is productive of sputum. Pertinent negatives include no chest pain, headaches or weight loss. Her symptoms are alleviated by beta-agonist, OTC cough suppressant, leukotriene antagonist and steroid inhaler (and theophylline. ). She reports significant improvement on treatment.  Hyperlipidemia This is a chronic problem. The problem is controlled. Recent lipid tests were reviewed and are normal. There are no known factors aggravating her hyperlipidemia. Pertinent negatives include no chest pain or shortness of breath. Current antihyperlipidemic treatment includes statins. The current treatment provides moderate improvement of lipids. There are no compliance problems.  Risk factors for coronary artery disease include dyslipidemia.  Thyroid Problem Presents for follow-up visit. Patient reports no anxiety, cold intolerance, constipation, depressed mood, diaphoresis, diarrhea, dry skin, fatigue, hair loss, heat intolerance, hoarse voice, leg swelling, palpitations, tremors, visual change, weight gain or weight loss. The symptoms have been stable. Past treatments include levothyroxine. Her past medical history is significant for hyperlipidemia.  Anxiety Presents for follow-up visit. Patient reports no chest pain, depressed mood, dizziness, excessive worry, insomnia, irritability, malaise, nervous/anxious behavior, palpitations, panic, restlessness, shortness of breath or suicidal ideas. The quality of sleep is good.   Past  treatments include SSRIs. The treatment provided significant relief. Compliance with prior treatments has been good.   Really has been stable on current treatment.        Allergies  Allergen Reactions  . Codeine Nausea Only and Nausea And Vomiting  . Fluticasone-Salmeterol     Caused Pneumonia  . Levofloxacin     confusion, hallucinations  . Tiotropium Bromide Monohydrate Rash   Previous Medications   ACETAMINOPHEN (TYLENOL) 500 MG TABLET    Take 500 mg by mouth every 6 (six) hours as needed.   ACETYLCYSTEINE (MUCOMYST) 20 % NEBULIZER SOLUTION       ASPIRIN 81 MG TABLET    Take 81 mg by mouth daily.   ATORVASTATIN (LIPITOR) 40 MG TABLET    take 1 tablet by mouth every evening   AZITHROMYCIN (ZITHROMAX) 250 MG TABLET    2 today and then one a day for 4 more days.   CHOLECALCIFEROL (VITAMIN D) 1000 UNITS TABLET    Take 1,000 Units by mouth daily.   CITALOPRAM (CELEXA) 20 MG TABLET       CLOPIDOGREL (PLAVIX) 75 MG TABLET    Take 75 mg by mouth at bedtime.   FERROUS SULFATE 325 (65 FE) MG TABLET    take 1 tablet by mouth every morning   FLUTICASONE (FLONASE) 50 MCG/ACT NASAL SPRAY    Place into the nose.   FUROSEMIDE (LASIX) 20 MG TABLET       IPRATROPIUM-ALBUTEROL (DUONEB) 0.5-2.5 (3) MG/3ML SOLN       LEVOTHYROXINE (SYNTHROID, LEVOTHROID) 88 MCG TABLET       LORATADINE (CLARITIN) 10 MG TABLET       LORAZEPAM (ATIVAN) 0.5 MG TABLET    Take by mouth.   METOPROLOL SUCCINATE (TOPROL-XL) 25 MG 24  HR TABLET    Take 12.5 mg by mouth daily.   MONTELUKAST (SINGULAIR) 10 MG TABLET    Take 1 tablet (10 mg total) by mouth daily.   ONDANSETRON (ZOFRAN) 4 MG TABLET    Take 1 tablet (4 mg total) by mouth every 8 (eight) hours as needed for nausea or vomiting.   PANTOPRAZOLE (PROTONIX) 40 MG TABLET    take 1 tablet by mouth every morning   POLYETHYLENE GLYCOL (MIRALAX / GLYCOLAX) PACKET       POTASSIUM 99 MG TABS    Take by mouth.   PROAIR HFA 108 (90 BASE) MCG/ACT INHALER       SYMBICORT  160-4.5 MCG/ACT INHALER       THEO-24 200 MG 24 HR CAPSULE    take 1 capsule by mouth once daily    Review of Systems  Constitutional: Negative.  Negative for weight loss, weight gain, diaphoresis, irritability and fatigue.  HENT: Negative for hoarse voice.   Respiratory: Positive for cough and wheezing. Negative for apnea, choking, chest tightness, shortness of breath and stridor.        History of COPD; pt reports it is under good control.   Cardiovascular: Negative.  Negative for chest pain and palpitations.  Gastrointestinal: Negative.  Negative for diarrhea and constipation.  Endocrine: Negative for cold intolerance and heat intolerance.  Neurological: Negative for dizziness, tremors, weakness, light-headedness and headaches.  Hematological: Bruises/bleeds easily (On blood thinners.).  Psychiatric/Behavioral: Negative for suicidal ideas, sleep disturbance and dysphoric mood. The patient is not nervous/anxious and does not have insomnia.        Under good control with current medications; pt only needs her Lorazepam occasionally.    Social History  Substance Use Topics  . Smoking status: Former Smoker -- 0.25 packs/day for 65 years    Quit date: 11/11/2013  . Smokeless tobacco: Never Used  . Alcohol Use: No   Objective:   BP 110/60 mmHg  Pulse 72  Temp(Src) 97.9 F (36.6 C) (Oral)  Resp 16  Wt 150 lb (68.04 kg)  Physical Exam  Constitutional: She is oriented to person, place, and time. She appears well-developed and well-nourished.  HENT:  Oxygen per [place].   Pulmonary/Chest: Effort normal. She has wheezes.  Inspiratory and expiratory but with good air movement today.    Neurological: She is alert and oriented to person, place, and time.  Psychiatric: She has a normal mood and affect. Her behavior is normal. Judgment and thought content normal.        Assessment & Plan:     1. Chronic obstructive pulmonary disease, unspecified COPD type (HCC) Stable. Continue current  medication.    2. Hypothyroidism, unspecified hypothyroidism type Stable. Check labs.  - TSH  3. Body mass index (BMI) of 23.0-23.9 in adult  4. Anxiety Stable. Continue medication. Does not take Lorazepam.  - citalopram (CELEXA) 20 MG tablet; Take 1 tablet (20 mg total) by mouth daily.  Dispense: 90 tablet; Refill: 3  5. Hypercholesteremia Condition is stable. Please continue current medication and  plan of care as noted.  Will check labs.   - Lipid panel - Comprehensive metabolic panel  6. Chronic congestive heart failure, unspecified congestive heart failure type (HCC) Stable also. Continue medication.   - furosemide (LASIX) 20 MG tablet; Take 1 tablet (20 mg total) by mouth daily.  Dispense: 90 tablet; Refill: 3  7. Anemia, unspecified anemia type Will check labs.   - CBC with Differential/Platelet  Margarita Rana, MD  [facility]

## 2014-10-17 DIAGNOSIS — E7800 Pure hypercholesterolemia, unspecified: Secondary | ICD-10-CM | POA: Diagnosis not present

## 2014-10-17 DIAGNOSIS — D649 Anemia, unspecified: Secondary | ICD-10-CM | POA: Diagnosis not present

## 2014-10-17 DIAGNOSIS — E039 Hypothyroidism, unspecified: Secondary | ICD-10-CM | POA: Diagnosis not present

## 2014-10-18 ENCOUNTER — Telehealth: Payer: Self-pay

## 2014-10-18 LAB — COMPREHENSIVE METABOLIC PANEL
A/G RATIO: 1.8 (ref 1.1–2.5)
ALT: 11 IU/L (ref 0–32)
AST: 16 IU/L (ref 0–40)
Albumin: 4.1 g/dL (ref 3.5–4.8)
Alkaline Phosphatase: 88 IU/L (ref 39–117)
BILIRUBIN TOTAL: 0.4 mg/dL (ref 0.0–1.2)
BUN/Creatinine Ratio: 15 (ref 11–26)
BUN: 11 mg/dL (ref 8–27)
CALCIUM: 9.2 mg/dL (ref 8.7–10.3)
CHLORIDE: 103 mmol/L (ref 97–108)
CO2: 29 mmol/L (ref 18–29)
Creatinine, Ser: 0.73 mg/dL (ref 0.57–1.00)
GFR calc Af Amer: 92 mL/min/{1.73_m2} (ref >59)
GFR, EST NON AFRICAN AMERICAN: 80 mL/min/{1.73_m2} (ref >59)
GLOBULIN, TOTAL: 2.3 g/dL (ref 1.5–4.5)
Glucose: 80 mg/dL (ref 65–99)
POTASSIUM: 4.3 mmol/L (ref 3.5–5.2)
SODIUM: 147 mmol/L — AB (ref 134–144)
Total Protein: 6.4 g/dL (ref 6.0–8.5)

## 2014-10-18 LAB — LIPID PANEL
CHOLESTEROL TOTAL: 137 mg/dL (ref 100–199)
Chol/HDL Ratio: 2.8 ratio units (ref 0.0–4.4)
HDL: 49 mg/dL (ref >39)
LDL CALC: 69 mg/dL (ref 0–99)
TRIGLYCERIDES: 93 mg/dL (ref 0–149)
VLDL Cholesterol Cal: 19 mg/dL (ref 5–40)

## 2014-10-18 LAB — CBC WITH DIFFERENTIAL/PLATELET
BASOS: 0 %
Basophils Absolute: 0 10*3/uL (ref 0.0–0.2)
EOS (ABSOLUTE): 0.2 10*3/uL (ref 0.0–0.4)
Eos: 3 %
HEMATOCRIT: 37.7 % (ref 34.0–46.6)
Hemoglobin: 12 g/dL (ref 11.1–15.9)
Immature Grans (Abs): 0 10*3/uL (ref 0.0–0.1)
Immature Granulocytes: 0 %
Lymphocytes Absolute: 1.8 10*3/uL (ref 0.7–3.1)
Lymphs: 25 %
MCH: 26.8 pg (ref 26.6–33.0)
MCHC: 31.8 g/dL (ref 31.5–35.7)
MCV: 84 fL (ref 79–97)
MONOS ABS: 0.5 10*3/uL (ref 0.1–0.9)
Monocytes: 8 %
Neutrophils Absolute: 4.6 10*3/uL (ref 1.4–7.0)
Neutrophils: 64 %
PLATELETS: 330 10*3/uL (ref 150–379)
RBC: 4.48 x10E6/uL (ref 3.77–5.28)
RDW: 15.3 % (ref 12.3–15.4)
WBC: 7.2 10*3/uL (ref 3.4–10.8)

## 2014-10-18 LAB — TSH: TSH: 1.23 u[IU]/mL (ref 0.450–4.500)

## 2014-10-18 NOTE — Telephone Encounter (Signed)
Pt advised.   Thanks,   -Jomarion Mish  

## 2014-10-18 NOTE — Telephone Encounter (Signed)
-----   Message from Lorie Phenix, MD sent at 10/18/2014  6:31 AM EDT ----- Labs within normal limits. Thanks.

## 2014-10-18 NOTE — Telephone Encounter (Signed)
LMTCB 10/18/14  Thanks,  -Joseline 

## 2014-10-22 DIAGNOSIS — F329 Major depressive disorder, single episode, unspecified: Secondary | ICD-10-CM | POA: Diagnosis not present

## 2014-10-22 DIAGNOSIS — I2510 Atherosclerotic heart disease of native coronary artery without angina pectoris: Secondary | ICD-10-CM | POA: Diagnosis not present

## 2014-10-22 DIAGNOSIS — F17210 Nicotine dependence, cigarettes, uncomplicated: Secondary | ICD-10-CM | POA: Diagnosis not present

## 2014-10-22 DIAGNOSIS — Z9981 Dependence on supplemental oxygen: Secondary | ICD-10-CM | POA: Diagnosis not present

## 2014-10-22 DIAGNOSIS — J441 Chronic obstructive pulmonary disease with (acute) exacerbation: Secondary | ICD-10-CM | POA: Diagnosis not present

## 2014-10-22 DIAGNOSIS — D649 Anemia, unspecified: Secondary | ICD-10-CM | POA: Diagnosis not present

## 2014-10-22 DIAGNOSIS — J209 Acute bronchitis, unspecified: Secondary | ICD-10-CM | POA: Diagnosis not present

## 2014-10-22 DIAGNOSIS — I509 Heart failure, unspecified: Secondary | ICD-10-CM | POA: Diagnosis not present

## 2014-10-22 DIAGNOSIS — G4733 Obstructive sleep apnea (adult) (pediatric): Secondary | ICD-10-CM | POA: Diagnosis not present

## 2014-10-22 DIAGNOSIS — F418 Other specified anxiety disorders: Secondary | ICD-10-CM | POA: Diagnosis not present

## 2014-10-24 DIAGNOSIS — I509 Heart failure, unspecified: Secondary | ICD-10-CM | POA: Diagnosis not present

## 2014-10-24 DIAGNOSIS — D692 Other nonthrombocytopenic purpura: Secondary | ICD-10-CM | POA: Diagnosis not present

## 2014-10-24 DIAGNOSIS — Z9981 Dependence on supplemental oxygen: Secondary | ICD-10-CM | POA: Diagnosis not present

## 2014-10-24 DIAGNOSIS — J449 Chronic obstructive pulmonary disease, unspecified: Secondary | ICD-10-CM | POA: Diagnosis not present

## 2014-10-28 ENCOUNTER — Other Ambulatory Visit: Payer: Self-pay | Admitting: Family Medicine

## 2014-10-28 DIAGNOSIS — J441 Chronic obstructive pulmonary disease with (acute) exacerbation: Secondary | ICD-10-CM

## 2014-10-29 ENCOUNTER — Other Ambulatory Visit: Payer: Self-pay | Admitting: Family Medicine

## 2014-10-29 DIAGNOSIS — J441 Chronic obstructive pulmonary disease with (acute) exacerbation: Secondary | ICD-10-CM

## 2014-10-29 MED ORDER — PROAIR HFA 108 (90 BASE) MCG/ACT IN AERS: 2.0000 | INHALATION_SPRAY | RESPIRATORY_TRACT | Status: DC | PRN

## 2014-10-29 NOTE — Telephone Encounter (Signed)
Pt contacted office for refill request on the following medications: PROAIR HFA 108 (90 BASE) MCG/ACT inhaler to [facility] [address] Thanks TNP

## 2014-11-06 ENCOUNTER — Other Ambulatory Visit: Payer: Self-pay | Admitting: Family Medicine

## 2014-11-06 DIAGNOSIS — D649 Anemia, unspecified: Secondary | ICD-10-CM

## 2014-11-06 NOTE — Telephone Encounter (Signed)
LOV 10/16/2014, last CBC 10/17/2014- WNL> Allene DillonEmily Drozdowski, CMA

## 2014-11-14 ENCOUNTER — Inpatient Hospital Stay
Admission: EM | Admit: 2014-11-14 | Discharge: 2014-11-17 | DRG: 190 | Disposition: A | Discharge status: Home Health | Payer: Medicare Other | Attending: Internal Medicine | Admitting: Internal Medicine

## 2014-11-14 ENCOUNTER — Emergency Department: Payer: Medicare Other

## 2014-11-14 DIAGNOSIS — J440 Chronic obstructive pulmonary disease with acute lower respiratory infection: Principal | ICD-10-CM | POA: Diagnosis present

## 2014-11-14 DIAGNOSIS — Z79899 Other long term (current) drug therapy: Secondary | ICD-10-CM | POA: Diagnosis not present

## 2014-11-14 DIAGNOSIS — Z881 Allergy status to other antibiotic agents status: Secondary | ICD-10-CM | POA: Diagnosis not present

## 2014-11-14 DIAGNOSIS — Z888 Allergy status to other drugs, medicaments and biological substances status: Secondary | ICD-10-CM

## 2014-11-14 DIAGNOSIS — J45909 Unspecified asthma, uncomplicated: Secondary | ICD-10-CM | POA: Diagnosis not present

## 2014-11-14 DIAGNOSIS — R0902 Hypoxemia: Secondary | ICD-10-CM

## 2014-11-14 DIAGNOSIS — J441 Chronic obstructive pulmonary disease with (acute) exacerbation: Secondary | ICD-10-CM | POA: Diagnosis not present

## 2014-11-14 DIAGNOSIS — Z885 Allergy status to narcotic agent status: Secondary | ICD-10-CM | POA: Diagnosis not present

## 2014-11-14 DIAGNOSIS — R05 Cough: Secondary | ICD-10-CM | POA: Diagnosis not present

## 2014-11-14 DIAGNOSIS — R0602 Shortness of breath: Secondary | ICD-10-CM | POA: Diagnosis not present

## 2014-11-14 DIAGNOSIS — E7800 Pure hypercholesterolemia, unspecified: Secondary | ICD-10-CM | POA: Diagnosis not present

## 2014-11-14 DIAGNOSIS — Z9981 Dependence on supplemental oxygen: Secondary | ICD-10-CM | POA: Diagnosis not present

## 2014-11-14 DIAGNOSIS — Z7902 Long term (current) use of antithrombotics/antiplatelets: Secondary | ICD-10-CM

## 2014-11-14 DIAGNOSIS — E039 Hypothyroidism, unspecified: Secondary | ICD-10-CM | POA: Diagnosis present

## 2014-11-14 DIAGNOSIS — I2510 Atherosclerotic heart disease of native coronary artery without angina pectoris: Secondary | ICD-10-CM | POA: Diagnosis not present

## 2014-11-14 DIAGNOSIS — J9621 Acute and chronic respiratory failure with hypoxia: Secondary | ICD-10-CM | POA: Diagnosis present

## 2014-11-14 DIAGNOSIS — J9620 Acute and chronic respiratory failure, unspecified whether with hypoxia or hypercapnia: Secondary | ICD-10-CM | POA: Diagnosis not present

## 2014-11-14 DIAGNOSIS — F419 Anxiety disorder, unspecified: Secondary | ICD-10-CM | POA: Diagnosis present

## 2014-11-14 DIAGNOSIS — J449 Chronic obstructive pulmonary disease, unspecified: Secondary | ICD-10-CM

## 2014-11-14 DIAGNOSIS — J209 Acute bronchitis, unspecified: Secondary | ICD-10-CM | POA: Diagnosis present

## 2014-11-14 DIAGNOSIS — E876 Hypokalemia: Secondary | ICD-10-CM | POA: Diagnosis not present

## 2014-11-14 DIAGNOSIS — Z7982 Long term (current) use of aspirin: Secondary | ICD-10-CM

## 2014-11-14 DIAGNOSIS — F17210 Nicotine dependence, cigarettes, uncomplicated: Secondary | ICD-10-CM | POA: Diagnosis not present

## 2014-11-14 DIAGNOSIS — Z87891 Personal history of nicotine dependence: Secondary | ICD-10-CM | POA: Diagnosis not present

## 2014-11-14 DIAGNOSIS — E785 Hyperlipidemia, unspecified: Secondary | ICD-10-CM | POA: Diagnosis present

## 2014-11-14 HISTORY — DX: Unspecified asthma, uncomplicated: J45.909

## 2014-11-14 HISTORY — DX: Hyperlipidemia, unspecified: E78.5

## 2014-11-14 HISTORY — DX: Unspecified fall, initial encounter: W19.XXXA

## 2014-11-14 HISTORY — DX: Hypothyroidism, unspecified: E03.9

## 2014-11-14 HISTORY — DX: Restlessness and agitation: R45.1

## 2014-11-14 HISTORY — DX: Chronic obstructive pulmonary disease, unspecified: J44.9

## 2014-11-14 HISTORY — DX: Atherosclerotic heart disease of native coronary artery without angina pectoris: I25.10

## 2014-11-14 HISTORY — DX: Unstable angina: I20.0

## 2014-11-14 LAB — BASIC METABOLIC PANEL
ANION GAP: 14 (ref 5–15)
BUN: 18 mg/dL (ref 6–20)
CHLORIDE: 98 mmol/L — AB (ref 101–111)
CO2: 29 mmol/L (ref 22–32)
Calcium: 9.1 mg/dL (ref 8.9–10.3)
Creatinine, Ser: 0.87 mg/dL (ref 0.44–1.00)
GFR calc Af Amer: >60 mL/min (ref >60)
Glucose, Bld: 164 mg/dL — ABNORMAL HIGH (ref 65–99)
POTASSIUM: 2.9 mmol/L — AB (ref 3.5–5.1)
SODIUM: 141 mmol/L (ref 135–145)

## 2014-11-14 LAB — CBC WITH DIFFERENTIAL/PLATELET
BASOS ABS: 0 10*3/uL (ref 0–0.1)
BASOS PCT: 0 %
Eosinophils Absolute: 0 10*3/uL (ref 0–0.7)
Eosinophils Relative: 0 %
HEMATOCRIT: 35.7 % (ref 35.0–47.0)
Hemoglobin: 11.7 g/dL — ABNORMAL LOW (ref 12.0–16.0)
LYMPHS PCT: 7 %
Lymphs Abs: 1.1 10*3/uL (ref 1.0–3.6)
MCH: 26.9 pg (ref 26.0–34.0)
MCHC: 32.7 g/dL (ref 32.0–36.0)
MCV: 82.4 fL (ref 80.0–100.0)
Monocytes Absolute: 1.2 10*3/uL — ABNORMAL HIGH (ref 0.2–0.9)
Monocytes Relative: 8 %
NEUTROS ABS: 12.8 10*3/uL — AB (ref 1.4–6.5)
Neutrophils Relative %: 85 %
PLATELETS: 421 10*3/uL (ref 150–440)
RBC: 4.34 MIL/uL (ref 3.80–5.20)
RDW: 14.3 % (ref 11.5–14.5)
WBC: 15.1 10*3/uL — AB (ref 3.6–11.0)

## 2014-11-14 LAB — CREATININE, SERUM: CREATININE: 0.85 mg/dL (ref 0.44–1.00)

## 2014-11-14 LAB — TSH: TSH: 0.333 u[IU]/mL — AB (ref 0.350–4.500)

## 2014-11-14 LAB — TROPONIN I

## 2014-11-14 MED ORDER — MONTELUKAST SODIUM 10 MG PO TABS
10.0000 mg | ORAL_TABLET | Freq: Every day | ORAL | Status: DC
  Administered 2014-11-15 – 2014-11-17 (×3): 10 mg via ORAL
  Filled 2014-11-14 (×3): qty 1

## 2014-11-14 MED ORDER — IPRATROPIUM-ALBUTEROL 0.5-2.5 (3) MG/3ML IN SOLN
3.0000 mL | Freq: Once | RESPIRATORY_TRACT | Status: AC
  Administered 2014-11-14: 3 mL via RESPIRATORY_TRACT
  Filled 2014-11-14: qty 3

## 2014-11-14 MED ORDER — PANTOPRAZOLE SODIUM 40 MG PO TBEC
40.0000 mg | DELAYED_RELEASE_TABLET | Freq: Every day | ORAL | Status: DC
  Administered 2014-11-15 – 2014-11-17 (×3): 40 mg via ORAL
  Filled 2014-11-14 (×3): qty 1

## 2014-11-14 MED ORDER — DEXTROSE 5 % IV SOLN
1.0000 g | INTRAVENOUS | Status: DC
  Administered 2014-11-15 – 2014-11-16 (×3): 1 g via INTRAVENOUS
  Filled 2014-11-14 (×4): qty 10

## 2014-11-14 MED ORDER — ALBUTEROL SULFATE HFA 108 (90 BASE) MCG/ACT IN AERS: 2.0000 | INHALATION_SPRAY | RESPIRATORY_TRACT | Status: DC | PRN

## 2014-11-14 MED ORDER — LEVALBUTEROL HCL 0.63 MG/3ML IN NEBU
0.6300 mg | INHALATION_SOLUTION | Freq: Four times a day (QID) | RESPIRATORY_TRACT | Status: DC
  Administered 2014-11-14 – 2014-11-15 (×3): 0.63 mg via RESPIRATORY_TRACT
  Filled 2014-11-14 (×5): qty 3

## 2014-11-14 MED ORDER — VITAMIN D3 25 MCG (1000 UNIT) PO TABS
1000.0000 [IU] | ORAL_TABLET | Freq: Every day | ORAL | Status: DC
  Administered 2014-11-15 – 2014-11-17 (×3): 1000 [IU] via ORAL
  Filled 2014-11-14 (×6): qty 1

## 2014-11-14 MED ORDER — ONDANSETRON HCL 4 MG/2ML IJ SOLN: 4.0000 mg | Freq: Four times a day (QID) | INTRAMUSCULAR | Status: DC | PRN

## 2014-11-14 MED ORDER — ENOXAPARIN SODIUM 40 MG/0.4ML ~~LOC~~ SOLN
40.0000 mg | SUBCUTANEOUS | Status: DC
  Administered 2014-11-14 – 2014-11-16 (×3): 40 mg via SUBCUTANEOUS
  Filled 2014-11-14 (×3): qty 0.4

## 2014-11-14 MED ORDER — LORATADINE 10 MG PO TABS
10.0000 mg | ORAL_TABLET | Freq: Every day | ORAL | Status: DC
Start: 2014-11-15 — End: 2014-11-17
  Administered 2014-11-15 – 2014-11-17 (×3): 10 mg via ORAL
  Filled 2014-11-14 (×3): qty 1

## 2014-11-14 MED ORDER — METOPROLOL SUCCINATE 12.5 MG HALF TABLET
12.5000 mg | ORAL_TABLET | Freq: Every day | ORAL | Status: DC
  Administered 2014-11-16: 12.5 mg via ORAL
  Filled 2014-11-14 (×5): qty 1

## 2014-11-14 MED ORDER — GUAIFENESIN 100 MG/5ML PO SOLN
10.0000 mL | ORAL | Status: DC | PRN
  Administered 2014-11-14: 200 mg via ORAL
  Filled 2014-11-14: qty 10

## 2014-11-14 MED ORDER — METHYLPREDNISOLONE SODIUM SUCC 125 MG IJ SOLR
60.0000 mg | Freq: Four times a day (QID) | INTRAMUSCULAR | Status: DC
  Administered 2014-11-14 – 2014-11-16 (×7): 60 mg via INTRAVENOUS
  Filled 2014-11-14 (×7): qty 2

## 2014-11-14 MED ORDER — AZITHROMYCIN 250 MG PO TABS
500.0000 mg | ORAL_TABLET | Freq: Every day | ORAL | Status: DC
  Administered 2014-11-14 – 2014-11-17 (×4): 500 mg via ORAL
  Filled 2014-11-14 (×4): qty 2

## 2014-11-14 MED ORDER — ACETAMINOPHEN 500 MG PO TABS
1000.0000 mg | ORAL_TABLET | Freq: Four times a day (QID) | ORAL | Status: DC | PRN
Start: 2014-11-14 — End: 2014-11-17

## 2014-11-14 MED ORDER — FLUTICASONE PROPIONATE 50 MCG/ACT NA SUSP: 2.0000 | Freq: Every day | NASAL | Status: DC | PRN

## 2014-11-14 MED ORDER — CITALOPRAM HYDROBROMIDE 20 MG PO TABS
20.0000 mg | ORAL_TABLET | Freq: Every day | ORAL | Status: DC
  Administered 2014-11-15 – 2014-11-17 (×3): 20 mg via ORAL
  Filled 2014-11-14 (×3): qty 1

## 2014-11-14 MED ORDER — CLOPIDOGREL BISULFATE 75 MG PO TABS
75.0000 mg | ORAL_TABLET | Freq: Every day | ORAL | Status: DC
  Administered 2014-11-14 – 2014-11-16 (×3): 75 mg via ORAL
  Filled 2014-11-14 (×3): qty 1

## 2014-11-14 MED ORDER — POTASSIUM CHLORIDE 10 MEQ/100ML IV SOLN
10.0000 meq | Freq: Once | INTRAVENOUS | Status: AC
  Administered 2014-11-14: 10 meq via INTRAVENOUS
  Filled 2014-11-14: qty 100

## 2014-11-14 MED ORDER — ALBUTEROL SULFATE (2.5 MG/3ML) 0.083% IN NEBU
7.5000 mg | INHALATION_SOLUTION | Freq: Once | RESPIRATORY_TRACT | Status: DC
  Filled 2014-11-14: qty 9

## 2014-11-14 MED ORDER — ONDANSETRON HCL 4 MG PO TABS: 4.0000 mg | ORAL_TABLET | Freq: Three times a day (TID) | ORAL | Status: DC | PRN

## 2014-11-14 MED ORDER — BUDESONIDE-FORMOTEROL FUMARATE 160-4.5 MCG/ACT IN AERO
2.0000 | INHALATION_SPRAY | Freq: Two times a day (BID) | RESPIRATORY_TRACT | Status: DC
  Administered 2014-11-14 – 2014-11-16 (×5): 2 via RESPIRATORY_TRACT
  Filled 2014-11-14: qty 6

## 2014-11-14 MED ORDER — ATORVASTATIN CALCIUM 20 MG PO TABS
40.0000 mg | ORAL_TABLET | Freq: Every day | ORAL | Status: DC
  Administered 2014-11-14 – 2014-11-16 (×3): 40 mg via ORAL
  Filled 2014-11-14 (×3): qty 2

## 2014-11-14 MED ORDER — ASPIRIN EC 81 MG PO TBEC
81.0000 mg | DELAYED_RELEASE_TABLET | Freq: Every day | ORAL | Status: DC
  Administered 2014-11-15 – 2014-11-17 (×3): 81 mg via ORAL
  Filled 2014-11-14 (×3): qty 1

## 2014-11-14 MED ORDER — METHYLPREDNISOLONE SODIUM SUCC 125 MG IJ SOLR
125.0000 mg | Freq: Once | INTRAMUSCULAR | Status: AC
  Administered 2014-11-14: 125 mg via INTRAVENOUS
  Filled 2014-11-14: qty 2

## 2014-11-14 MED ORDER — IPRATROPIUM-ALBUTEROL 0.5-2.5 (3) MG/3ML IN SOLN: 3.0000 mL | Freq: Once | RESPIRATORY_TRACT | Status: DC

## 2014-11-14 MED ORDER — ACETAMINOPHEN 650 MG RE SUPP: 650.0000 mg | Freq: Four times a day (QID) | RECTAL | Status: DC | PRN

## 2014-11-14 MED ORDER — ACETAMINOPHEN 325 MG PO TABS: 650.0000 mg | ORAL_TABLET | Freq: Four times a day (QID) | ORAL | Status: DC | PRN

## 2014-11-14 MED ORDER — POTASSIUM CHLORIDE CRYS ER 20 MEQ PO TBCR
40.0000 meq | EXTENDED_RELEASE_TABLET | ORAL | Status: AC
  Administered 2014-11-14: 40 meq via ORAL
  Filled 2014-11-14: qty 2

## 2014-11-14 MED ORDER — FERROUS SULFATE 325 (65 FE) MG PO TABS
325.0000 mg | ORAL_TABLET | Freq: Every day | ORAL | Status: DC
  Administered 2014-11-14 – 2014-11-16 (×3): 325 mg via ORAL
  Filled 2014-11-14 (×3): qty 1

## 2014-11-14 MED ORDER — POTASSIUM CHLORIDE CRYS ER 20 MEQ PO TBCR
40.0000 meq | EXTENDED_RELEASE_TABLET | Freq: Once | ORAL | Status: AC
  Administered 2014-11-14: 40 meq via ORAL
  Filled 2014-11-14: qty 2

## 2014-11-14 MED ORDER — FUROSEMIDE 20 MG PO TABS
20.0000 mg | ORAL_TABLET | Freq: Every day | ORAL | Status: DC
  Administered 2014-11-15 – 2014-11-17 (×3): 20 mg via ORAL
  Filled 2014-11-14 (×3): qty 1

## 2014-11-14 MED ORDER — LEVOTHYROXINE SODIUM 88 MCG PO TABS
88.0000 ug | ORAL_TABLET | Freq: Every day | ORAL | Status: DC
  Administered 2014-11-15 – 2014-11-16 (×2): 88 ug via ORAL
  Filled 2014-11-14 (×2): qty 1

## 2014-11-14 MED ORDER — SODIUM CHLORIDE 0.9 % IV SOLN
INTRAVENOUS | Status: DC
  Administered 2014-11-14: 23:00:00 via INTRAVENOUS

## 2014-11-14 MED ORDER — THEOPHYLLINE ER 200 MG PO CP24
200.0000 mg | ORAL_CAPSULE | Freq: Every day | ORAL | Status: DC
  Administered 2014-11-15 – 2014-11-16 (×2): 200 mg via ORAL
  Filled 2014-11-14 (×3): qty 1

## 2014-11-14 MED ORDER — ONDANSETRON HCL 4 MG PO TABS: 4.0000 mg | ORAL_TABLET | Freq: Four times a day (QID) | ORAL | Status: DC | PRN

## 2014-11-14 NOTE — H&P (Signed)
[facility] - [place] at [facility]   PATIENT NAME: Samantha Goodman    MR#:  [id number]  DATE OF BIRTH:  1935/10/26  DATE OF ADMISSION:  11/14/2014  PRIMARY CARE PHYSICIAN: Lorie Phenix, MD   REQUESTING/REFERRING PHYSICIAN: Governor Rooks M.D.  CHIEF COMPLAINT:   Chief Complaint  Patient presents with  . Shortness of Breath    HISTORY OF PRESENT ILLNESS: Samantha Goodman  is a 79 y.o. female with a known history of COPD on chronic oxygen at 2 L, coronary artery disease, hyperlipidemia who states that she has chronic shortness of breath but since last night her breathing has gotten worse. She had to increase her oxygen to 2-1/2 L and now to 3 L. She has been having significant amount of coughing which is productive of whitish sputum. She has not checked her temperature but has not felt feverish. She denies any chest pain or palpitations. Denies any lower extremity swelling. PAST MEDICAL HISTORY:   Past Medical History  Diagnosis Date  . COPD (chronic obstructive pulmonary disease) (HCC)   . Asthma   . CAD (coronary artery disease)   . Unstable angina pectoris (HCC)   . Hyperlipemia   . Agitation   . Fall   . Hypothyroid     PAST SURGICAL HISTORY:  Past Surgical History  Procedure Laterality Date  . Eye surgery    . Abdominal hysterectomy    . Appendectomy    . Ankle surgery Right     SOCIAL HISTORY:  Social History  Substance Use Topics  . Smoking status: Former Smoker -- 0.25 packs/day for 65 years    Quit date: 11/11/2013  . Smokeless tobacco: Never Used  . Alcohol Use: No    FAMILY HISTORY:  Family History  Problem Relation Age of Onset  . Stroke Mother   . Prostate cancer Father   . CAD Brother   . Ovarian cancer Sister   . Breast cancer Sister   . Lung cancer Sister     DRUG ALLERGIES:  Allergies  Allergen Reactions  . Codeine Nausea And Vomiting  . Fluticasone-Salmeterol Other (See Comments)    Reaction:  Unknown   . Levofloxacin  Other (See Comments)    Reaction:  Confusion and hallucinations   . Tiotropium Bromide Monohydrate Rash    REVIEW OF SYSTEMS:   CONSTITUTIONAL: No fever, fatigue or weakness.  EYES: No blurred or double vision.  EARS, NOSE, AND THROAT: No tinnitus or ear pain.  RESPIRATORY: Productive cough, positive shortness of breath ,positive wheezing no hemoptysis.  CARDIOVASCULAR: No chest pain, orthopnea, edema.  GASTROINTESTINAL: No nausea, vomiting, diarrhea or abdominal pain.  GENITOURINARY: No dysuria, hematuria.  ENDOCRINE: No polyuria, nocturia,  HEMATOLOGY: No anemia, easy bruising or bleeding SKIN: No rash or lesion. MUSCULOSKELETAL: No joint pain or arthritis.   NEUROLOGIC: No tingling, numbness, weakness.  PSYCHIATRY: No anxiety or depression.   MEDICATIONS AT HOME:  Prior to Admission medications   Medication Sig Start Date End Date Taking? Authorizing Provider  atorvastatin (LIPITOR) 40 MG tablet Take 40 mg by mouth at bedtime.   Yes Historical Provider, MD  citalopram (CELEXA) 20 MG tablet Take 1 tablet (20 mg total) by mouth daily. 10/16/14  Yes Lorie Phenix, MD  ferrous sulfate 325 (65 FE) MG tablet Take 325 mg by mouth at bedtime.   Yes Historical Provider, MD  acetaminophen (TYLENOL) 500 MG tablet Take 500 mg by mouth every 6 (six) hours as needed.    Historical Provider, MD  acetylcysteine (MUCOMYST) 20 % nebulizer solution  04/05/14   Historical Provider, MD  aspirin 81 MG tablet Take 81 mg by mouth daily.    Historical Provider, MD  cholecalciferol (VITAMIN D) 1000 UNITS tablet Take 1,000 Units by mouth daily.    Historical Provider, MD  clopidogrel (PLAVIX) 75 MG tablet Take 75 mg by mouth at bedtime. 04/05/14   Historical Provider, MD  fluticasone (FLONASE) 50 MCG/ACT nasal spray Place into the nose. 10/26/13   Historical Provider, MD  furosemide (LASIX) 20 MG tablet Take 1 tablet (20 mg total) by mouth daily. 10/16/14   Lorie PhenixNancy Maloney, MD  levothyroxine (SYNTHROID,  LEVOTHROID) 88 MCG tablet  07/02/14   Historical Provider, MD  loratadine (CLARITIN) 10 MG tablet  06/30/14   Historical Provider, MD  LORazepam (ATIVAN) 0.5 MG tablet Take by mouth. 01/08/14   Historical Provider, MD  metoprolol succinate (TOPROL-XL) 25 MG 24 hr tablet Take 12.5 mg by mouth daily. 07/02/14   Historical Provider, MD  montelukast (SINGULAIR) 10 MG tablet Take 1 tablet (10 mg total) by mouth daily. 08/22/14   Lorie PhenixNancy Maloney, MD  ondansetron (ZOFRAN) 4 MG tablet Take 1 tablet (4 mg total) by mouth every 8 (eight) hours as needed for nausea or vomiting. 08/22/14   Lorie PhenixNancy Maloney, MD  polyethylene glycol [facility](MIRALAX / Ethelene HalGLYCOLAX) packet  07/02/14   Historical Provider, MD  Potassium 99 MG TABS Take by mouth.    Historical Provider, MD  PROAIR HFA 108 (90 BASE) MCG/ACT inhaler Inhale 2 puffs into the lungs every 4 (four) hours as needed for wheezing or shortness of breath. 10/29/14   Lorie PhenixNancy Maloney, MD  SYMBICORT 160-4.5 MCG/ACT inhaler  03/30/14   Historical Provider, MD      PHYSICAL EXAMINATION:   VITAL SIGNS: Blood pressure 132/67, pulse 97, temperature 98 F (36.7 C), resp. rate 26, height 5\' 4"  (1.626 m), weight 63.504 kg (140 lb), SpO2 96 %.  GENERAL:  [age] y.o.-year-old patient lying in the bed mild respiratory distress  EYES: Pupils equal, round, reactive to light and accommodation. No scleral icterus. Extraocular muscles intact.  HEENT: Head atraumatic, normocephalic. Oropharynx and nasopharynx clear.  NECK:  Supple, no jugular venous distention. No thyroid enlargement, no tenderness.  LUNGS: Patient with some associated muscle usage, bilateral wheezing CARDIOVASCULAR: S1, S2 normal. No murmurs, rubs, or gallops.  ABDOMEN: Soft, nontender, nondistended. Bowel sounds present. No organomegaly or mass.  EXTREMITIES: No pedal edema, cyanosis, or clubbing.  NEUROLOGIC: Cranial nerves II through XII are intact. Muscle strength 5/5 in all extremities. Sensation intact. Gait not checked.   PSYCHIATRIC: The patient is alert and oriented x 3.  SKIN: No obvious rash, lesion, or ulcer.   LABORATORY PANEL:   CBC  Recent Labs Lab 11/14/14 1723  WBC 15.1*  HGB 11.7*  HCT 35.7  PLT 421  MCV 82.4  MCH 26.9  MCHC 32.7  RDW 14.3  LYMPHSABS 1.1  MONOABS 1.2*  EOSABS 0.0  BASOSABS 0.0   ------------------------------------------------------------------------------------------------------------------  Chemistries   Recent Labs Lab 11/14/14 1723  NA 141  K 2.9*  CL 98*  CO2 29  GLUCOSE 164*  BUN 18  CREATININE 0.87  CALCIUM 9.1   ------------------------------------------------------------------------------------------------------------------ estimated creatinine clearance is 46.8 mL/min (by C-G formula based on Cr of 0.87). ------------------------------------------------------------------------------------------------------------------ No results for input(s): TSH, T4TOTAL, T3FREE, THYROIDAB in the last 72 hours.  Invalid input(s): FREET3   Coagulation profile No results for input(s): INR, PROTIME in the last 168 hours. ------------------------------------------------------------------------------------------------------------------- No results for input(s): DDIMER  in the last 72 hours. -------------------------------------------------------------------------------------------------------------------  Cardiac Enzymes  Recent Labs Lab 11/14/14 1723  TROPONINI <0.03   ------------------------------------------------------------------------------------------------------------------ Invalid input(s): POCBNP  ---------------------------------------------------------------------------------------------------------------  Urinalysis    Component Value Date/Time   COLORURINE Straw 02/11/2014 1312   APPEARANCEUR Clear 02/11/2014 1312   LABSPEC 1.008 02/11/2014 1312   PHURINE 7.0 02/11/2014 1312   GLUCOSEU Negative 02/11/2014 1312   HGBUR 1+  02/11/2014 1312   BILIRUBINUR Negative 02/11/2014 1312   KETONESUR Negative 02/11/2014 1312   PROTEINUR Negative 02/11/2014 1312   NITRITE Negative 02/11/2014 1312   LEUKOCYTESUR Negative 02/11/2014 1312     RADIOLOGY: Dg Chest Port 1 View  11/14/2014  CLINICAL DATA:  79 year old with acute onset of severe shortness of breath shortness of breath (COPD exacerbation). Hypokalemia on laboratory analysis. EXAM: PORTABLE CHEST 1 VIEW COMPARISON:  02/23/2014 and earlier. FINDINGS: Cardiac silhouette mildly enlarged, unchanged. Thoracic aorta atherosclerotic, unchanged. Hilar and mediastinal contours otherwise unremarkable. Scarring in the lung bases, unchanged. Biapical pleuroparenchymal scarring, unchanged. Prominent bronchovascular markings diffusely and mild to moderate central peribronchial thickening, unchanged. No new pulmonary parenchymal abnormalities. IMPRESSION: 1.  No acute cardiopulmonary disease. 2. Stable mild cardiomegaly without pulmonary edema. 3. Stable mild to moderate changes of chronic bronchitis and/or asthma. Electronically Signed   By: Hulan Saas M.D.   On: 11/14/2014 18:19    EKG: Orders placed or performed during the hospital encounter of 11/14/14  . ED EKG  . ED EKG  . EKG 12-Lead  . EKG 12-Lead    IMPRESSION AND PLAN: Patient is a 79 year old white female female with chronic respiratory failure on chronic oxygen therapy presents with shortness of breath and cough  1. Acute on chronic hypoxic respiratory failure: Due to acute on chronic COPD exasperation at this time will treat patient with nebulizers every 6 hours with Xopenex due to elevated heart rate, as well as IV Solu-Medrol, for acute bronchitis I will treat with IV ceftriaxone azithromycin, continue Symbicort, and theophylline  2. Coronary artery disease continue Plavix and metoprolol as taking at home  3. Hypothyroidism continue Synthroid  4. Hyperlipidemia continue atorvastatin  5. Nicotine  addiction smoking cessation provided strongly recommended patient stop smoking she reports that she's been on a nicotine patch but that causes irritation to her skin is nicotine inhaler as needed  6. Lovenox for DVT prophylaxis    All the records are reviewed and case discussed with ED provider. Management plans discussed with the patient, family and they are in agreement.  CODE STATUS: Full    TOTAL TIME TAKING CARE OF THIS PATIENT: 55 minutes.    Auburn Bilberry M.D on 11/14/2014 at 7:00 PM  Between 7am to 6pm - Pager - [phone]  After 6pm go to [email or web address] - password [email or web address] [facility]  [facility]  Office  [phone]  CC: Primary care physician; Lorie Phenix, MD

## 2014-11-14 NOTE — ED Notes (Addendum)
Neb treatments complete. Pt 96% on 2L

## 2014-11-14 NOTE — ED Notes (Signed)
CRITICAL VALUE ALERT  Critical value received:  2.9 Potassium   Date of notification:  11/14/14  Time of notification:  1810  Critical value read back:Yes.   Candida Peelingara Baker  Nurse who received alert:  [place] , RN  MD notified (1st page):  Dr Shaune PollackLord  Time of first page:  1810  MD notified (2nd page):  Time of second page:  Responding MD:  Dr Shaune Pollacklord   Time MD responded:  [phone]

## 2014-11-14 NOTE — ED Notes (Signed)
Critical value called from lab Potassium 2.9. Dr. Shaune PollackLord notified

## 2014-11-14 NOTE — ED Notes (Signed)
Patient unable to verbalize anything due to shortness of breath.  Patient roomed to room 17.

## 2014-11-14 NOTE — ED Provider Notes (Signed)
[facility] Emergency Department Provider Note   ____________________________________________  Time seen: Upon arrival to ED room I have reviewed the triage vital signs and the triage nursing note.  HISTORY  Chief Complaint Shortness of Breath   Historian Patient  HPI Samantha Goodman is a 78 y.o. female who arrived by EMS for shortness of breath and trouble breathing. She does wear home oxygen at 2 L, and today she was having increased trouble breathing and wheezing and increased O2 due to 3 L. She tried her breathing nebulizer at home, and had no improvement and so came to the emergency department for evaluation.Chest tightness but no chest pain. No nausea or vomiting or sweating. No fevers. Positive for cough. No productive cough. No lower extremity edema.    Past Medical History  Diagnosis Date  . COPD (chronic obstructive pulmonary disease) (HCC)   . Asthma     Patient Active Problem List   Diagnosis Date Noted  . COPD exacerbation (HCC) 08/22/2014  . Oxygen dependent 07/13/2014  . Acid reflux 07/09/2014  . Allergic rhinitis 07/06/2014  . Anxiety 07/06/2014  . Body mass index (BMI) of 23.0-23.9 in adult 07/06/2014  . CCF (congestive cardiac failure) (HCC) 07/06/2014  . Chronic constipation 07/06/2014  . Atherosclerosis of coronary artery 07/06/2014  . Dizziness 07/06/2014  . Edema extremities 07/06/2014  . Bleeding gastrointestinal 07/06/2014  . Hypercholesteremia 07/06/2014  . Decreased potassium in the blood 07/06/2014  . Adult hypothyroidism 07/06/2014  . OP (osteoporosis) 07/06/2014  . Procidentia of rectum 07/06/2014  . Compulsive tobacco user syndrome 07/06/2014  . Anemia 07/04/2014  . Acute blood loss anemia 05/13/2013  . Agitated 04/26/2013  . Arteriosclerosis of coronary artery 03/09/2013  . Accelerating angina (HCC) 03/09/2013  . Unstable angina pectoris (HCC) 03/09/2013    Past Surgical History  Procedure Laterality Date  .  Eye surgery    . Abdominal hysterectomy    . Appendectomy    . Ankle surgery Right     Current Outpatient Rx  Name  Route  Sig  Dispense  Refill  . acetaminophen (TYLENOL) 500 MG tablet   Oral   Take 500 mg by mouth every 6 (six) hours as needed.         Marland Kitchen acetylcysteine (MUCOMYST) 20 % nebulizer solution            0   . aspirin 81 MG tablet   Oral   Take 81 mg by mouth daily.         Marland Kitchen atorvastatin (LIPITOR) 40 MG tablet      take 1 tablet by mouth every evening   90 tablet   3   . azithromycin (ZITHROMAX) 250 MG tablet      2 today and then one a day for 4 more days.   6 tablet   0   . cholecalciferol (VITAMIN D) 1000 UNITS tablet   Oral   Take 1,000 Units by mouth daily.         . citalopram (CELEXA) 20 MG tablet   Oral   Take 1 tablet (20 mg total) by mouth daily.   90 tablet   3   . clopidogrel (PLAVIX) 75 MG tablet   Oral   Take 75 mg by mouth at bedtime.      0   . ferrous sulfate 325 (65 FE) MG tablet      take 1 tablet by mouth every evening   30 tablet   1   .  fluticasone (FLONASE) 50 MCG/ACT nasal spray   Nasal   Place into the nose.         . furosemide (LASIX) 20 MG tablet   Oral   Take 1 tablet (20 mg total) by mouth daily.   90 tablet   3   . ipratropium-albuterol (DUONEB) 0.5-2.5 (3) MG/3ML SOLN      inhale contents of 1 vial four times a day in nebulizer   360 mL   3   . levothyroxine (SYNTHROID, LEVOTHROID) 88 MCG tablet            0   . loratadine (CLARITIN) 10 MG tablet            1   . LORazepam (ATIVAN) 0.5 MG tablet   Oral   Take by mouth.         . metoprolol succinate (TOPROL-XL) 25 MG 24 hr tablet   Oral   Take 12.5 mg by mouth daily.      0   . montelukast (SINGULAIR) 10 MG tablet   Oral   Take 1 tablet (10 mg total) by mouth daily.   90 tablet   3   . ondansetron (ZOFRAN) 4 MG tablet   Oral   Take 1 tablet (4 mg total) by mouth every 8 (eight) hours as needed for nausea or  vomiting.   90 tablet   3   . pantoprazole (PROTONIX) 40 MG tablet      take 1 tablet by mouth every morning   90 tablet   3   . polyethylene glycol (MIRALAX / GLYCOLAX) packet            0   . Potassium 99 MG TABS   Oral   Take by mouth.         Marland Kitchen PROAIR HFA 108 (90 BASE) MCG/ACT inhaler   Inhalation   Inhale 2 puffs into the lungs every 4 (four) hours as needed for wheezing or shortness of breath.   18 g   5     Dispense as written.   . SYMBICORT 160-4.5 MCG/ACT inhaler            0     Dispense as written.   Marland Kitchen THEO-24 200 MG 24 hr capsule      take 1 capsule by mouth once daily   30 capsule   2     Allergies Codeine; Fluticasone-salmeterol; Levofloxacin; and Tiotropium bromide monohydrate  Family History  Problem Relation Age of Onset  . Stroke Mother   . Prostate cancer Father   . CAD Brother   . Ovarian cancer Sister   . Breast cancer Sister   . Lung cancer Sister     Social History Social History  Substance Use Topics  . Smoking status: Former Smoker -- 0.25 packs/day for 65 years    Quit date: 11/11/2013  . Smokeless tobacco: Never Used  . Alcohol Use: No    Review of Systems  Constitutional: Negative for fever. Eyes: Negative for visual changes. ENT: Negative for sore throat. Cardiovascular: Negative for palpitations. Respiratory: Positive for shortness of breath. Gastrointestinal: Negative for abdominal pain, vomiting and diarrhea. Genitourinary: Negative for dysuria. Musculoskeletal: Negative for back pain. Skin: Negative for rash. Neurological: Negative for headache. 10 point Review of Systems otherwise negative ____________________________________________   PHYSICAL EXAM:  VITAL SIGNS: ED Triage Vitals  Enc Vitals Group     BP 11/14/14 1731 132/67 mmHg     Pulse Rate 11/14/14  1718 108     Resp 11/14/14 1718 30     Temp 11/14/14 1718 98 F (36.7 C)     Temp src --      SpO2 11/14/14 1718 84 %     Weight  11/14/14 1718 140 lb (63.504 kg)     Height 11/14/14 1718 5\' 4"  (1.626 m)     Head Cir --      Peak Flow --      Pain Score --      Pain Loc --      Pain Edu? --      Excl. in GC? --      Constitutional: Alert and oriented. Well appearing and in moderate respiratory distress. Eyes: Conjunctivae are normal. PERRL. Normal extraocular movements. ENT   Head: Normocephalic and atraumatic.   Nose: No congestion/rhinnorhea.   Mouth/Throat: Mucous membranes are moist.   Neck: No stridor. Cardiovascular/Chest: Tachycardic, regular rhythm.  No murmurs, rubs, or gallops. Respiratory: Tachypneic, Moderate wheezing throughout all fields with decreased air movement throughout all fields. No rhonchi or rales. Gastrointestinal: Soft. No distention, no guarding, no rebound. Nontender   Genitourinary/rectal:Deferred Musculoskeletal: Nontender with normal range of motion in all extremities. No joint effusions.  No lower extremity tenderness.  No edema. Neurologic:  Normal speech and language. No gross or focal neurologic deficits are appreciated. Skin:  Skin is warm, dry and intact. No rash noted. Psychiatric: Mood and affect are normal. Speech and behavior are normal. Patient exhibits appropriate insight and judgment.  ____________________________________________   EKG I, Governor Rooksebecca Jasline Buskirk, MD, the attending physician have personally viewed and interpreted all ECGs.  112 bpm. Sinus tachycardia. Narrow QRS. Normal axis. Nonspecific ST and T-wave. ____________________________________________  LABS (pertinent positives/negatives)  White blood count 15.1 with left shift. Hemoglobin 11.7 and platelet count 421 Basic metabolic panel significant for potassium 2.9, chloride 98 Troponin less than 0.03  ____________________________________________  RADIOLOGY All Xrays were viewed by me. Imaging interpreted by Radiologist.  Chest x-ray portable:  IMPRESSION: 1. No acute cardiopulmonary  disease. 2. Stable mild cardiomegaly without pulmonary edema. 3. Stable mild to moderate changes of chronic bronchitis and/or asthma. __________________________________________  PROCEDURES  Procedure(s) performed: None  Critical Care performed: CRITICAL CARE Performed by: Governor RooksLORD, Melissaann Dizdarevic   Total critical care time: 30 minutes  Critical care time was exclusive of separately billable procedures and treating other patients.  Critical care was necessary to treat or prevent imminent or life-threatening deterioration.  Critical care was time spent personally by me on the following activities: development of treatment plan with patient and/or surrogate as well as nursing, discussions with consultants, evaluation of patient's response to treatment, examination of patient, obtaining history from patient or surrogate, ordering and performing treatments and interventions, ordering and review of laboratory studies, ordering and review of radiographic studies, pulse oximetry and re-evaluation of patient's condition.   ____________________________________________   ED COURSE / ASSESSMENT AND PLAN  CONSULTATIONS: Face-to-face with hospitalist for admission  Pertinent labs & imaging results that were available during my care of the patient were reviewed by me and considered in my medical decision making (see chart for details).  Patient arrived in moderate respiratory distress due to wheezing felt to be due to COPD exacerbation with hypoxia at baseline. Patient was given 3 DuoNeb treatments with some improvement, however still significantly wheezy. Patient be admitted for hospitalization for treatment of COPD exacerbation with hypoxia. Chest x-ray showing no new infiltrate. No hypotension.   Patient / Family / Caregiver informed of clinical  course, medical decision-making process, and agree with plan.   ___________________________________________   FINAL CLINICAL IMPRESSION(S) / ED  DIAGNOSES   Final diagnoses:  COPD exacerbation (HCC)  Hypoxia       Governor Rooks, MD 11/14/14 1825

## 2014-11-15 ENCOUNTER — Inpatient Hospital Stay: Payer: Medicare Other

## 2014-11-15 LAB — BASIC METABOLIC PANEL
ANION GAP: 10 (ref 5–15)
BUN: 17 mg/dL (ref 6–20)
CHLORIDE: 107 mmol/L (ref 101–111)
CO2: 28 mmol/L (ref 22–32)
Calcium: 9 mg/dL (ref 8.9–10.3)
Creatinine, Ser: 0.86 mg/dL (ref 0.44–1.00)
GFR calc non Af Amer: >60 mL/min (ref >60)
GLUCOSE: 153 mg/dL — AB (ref 65–99)
Potassium: 3.3 mmol/L — ABNORMAL LOW (ref 3.5–5.1)
Sodium: 145 mmol/L (ref 135–145)

## 2014-11-15 LAB — CBC
HEMATOCRIT: 34.8 % — AB (ref 35.0–47.0)
HEMOGLOBIN: 11.3 g/dL — AB (ref 12.0–16.0)
MCH: 26.9 pg (ref 26.0–34.0)
MCHC: 32.4 g/dL (ref 32.0–36.0)
MCV: 83.1 fL (ref 80.0–100.0)
Platelets: 390 10*3/uL (ref 150–440)
RBC: 4.18 MIL/uL (ref 3.80–5.20)
RDW: 14.6 % — ABNORMAL HIGH (ref 11.5–14.5)
WBC: 15.9 10*3/uL — AB (ref 3.6–11.0)

## 2014-11-15 MED ORDER — BENZONATATE 100 MG PO CAPS
200.0000 mg | ORAL_CAPSULE | Freq: Two times a day (BID) | ORAL | Status: DC | PRN
  Administered 2014-11-15 – 2014-11-17 (×2): 200 mg via ORAL
  Filled 2014-11-15 (×3): qty 2

## 2014-11-15 MED ORDER — GLYCOPYRROLATE 1 MG PO TABS
1.0000 mg | ORAL_TABLET | Freq: Two times a day (BID) | ORAL | Status: AC
  Administered 2014-11-15 (×2): 1 mg via ORAL
  Filled 2014-11-15 (×3): qty 1

## 2014-11-15 MED ORDER — GUAIFENESIN ER 600 MG PO TB12
600.0000 mg | ORAL_TABLET | Freq: Two times a day (BID) | ORAL | Status: DC
  Administered 2014-11-15 – 2014-11-17 (×4): 600 mg via ORAL
  Filled 2014-11-15 (×4): qty 1

## 2014-11-15 MED ORDER — LEVALBUTEROL HCL 0.63 MG/3ML IN NEBU
0.6300 mg | INHALATION_SOLUTION | RESPIRATORY_TRACT | Status: DC
  Administered 2014-11-15 – 2014-11-17 (×13): 0.63 mg via RESPIRATORY_TRACT
  Filled 2014-11-15 (×13): qty 3

## 2014-11-15 MED ORDER — LORAZEPAM 2 MG/ML IJ SOLN
1.0000 mg | Freq: Once | INTRAMUSCULAR | Status: AC
  Administered 2014-11-15: 1 mg via INTRAVENOUS
  Filled 2014-11-15: qty 1

## 2014-11-15 NOTE — Progress Notes (Signed)
Paged Dr. Winona LegatoVaickute @ 2140 to ask for mucinex per patient request.  Patient takes it at home.  Doctor agreed with request.  Arturo MortonClay, Ayaansh Smail N  11/15/2014  10:19 PM

## 2014-11-15 NOTE — Progress Notes (Deleted)
[facility] [place] [facility]   PATIENT NAME: Samantha Goodman    MR#:  [id number]  DATE OF BIRTH:  10/17/1935  SUBJECTIVE:  CHIEF COMPLAINT:   Chief Complaint  Patient presents with  . Shortness of Breath    REVIEW OF SYSTEMS:   ROS CONSTITUTIONAL: No fever, fatigue or weakness.  EYES: No blurred or double vision.  EARS, NOSE, AND THROAT: No tinnitus or ear pain.  RESPIRATORY: No cough, shortness of breath, wheezing or hemoptysis.  CARDIOVASCULAR: No chest pain, orthopnea, edema.  GASTROINTESTINAL: No nausea, vomiting, diarrhea or abdominal pain.  GENITOURINARY: No dysuria, hematuria.  ENDOCRINE: No polyuria, nocturia,  HEMATOLOGY: No anemia, easy bruising or bleeding SKIN: No rash or lesion. MUSCULOSKELETAL: No joint pain or arthritis.   NEUROLOGIC: No tingling, numbness, weakness.  PSYCHIATRY: No anxiety or depression.   DRUG ALLERGIES:   Allergies  Allergen Reactions  . Codeine Nausea And Vomiting  . Fluticasone-Salmeterol Other (See Comments)    Reaction:  Unknown   . Levofloxacin Other (See Comments)    Reaction:  Confusion and hallucinations   . Tiotropium Bromide Monohydrate Rash    VITALS:  Blood pressure 103/43, pulse 89, temperature 98.2 F (36.8 C), temperature source Oral, resp. rate 20, height 5\' 4"  (1.626 m), weight 66.271 kg (146 lb 1.6 oz), SpO2 97 %.  PHYSICAL EXAMINATION:  GENERAL:  79 y.o.-year-old patient lying in the bed with no acute distress.  EYES: Pupils equal, round, reactive to light and accommodation. No scleral icterus. Extraocular muscles intact.  HEENT: Head atraumatic, normocephalic. Oropharynx and nasopharynx clear.  NECK:  Supple, no jugular venous distention. No thyroid enlargement, no tenderness.  LUNGS: Normal breath sounds bilaterally, no wheezing, rales,rhonchi or crepitation. No use of accessory muscles of respiration.  CARDIOVASCULAR: S1, S2 normal. No murmurs, rubs, or gallops.  ABDOMEN: Soft,  nontender, nondistended. Bowel sounds present. No organomegaly or mass.  EXTREMITIES: No pedal edema, cyanosis, or clubbing.  NEUROLOGIC: Cranial nerves II through XII are intact. Muscle strength 5/5 in all extremities. Sensation intact. Gait not checked.  PSYCHIATRIC: The patient is alert and oriented x 3.  SKIN: No obvious rash, lesion, or ulcer.    LABORATORY PANEL:   CBC  Recent Labs Lab 11/15/14 0358  WBC 15.9*  HGB 11.3*  HCT 34.8*  PLT 390   ------------------------------------------------------------------------------------------------------------------  Chemistries   Recent Labs Lab 11/15/14 0358  NA 145  K 3.3*  CL 107  CO2 28  GLUCOSE 153*  BUN 17  CREATININE 0.86  CALCIUM 9.0   ------------------------------------------------------------------------------------------------------------------  Cardiac Enzymes  Recent Labs Lab 11/14/14 1723  TROPONINI <0.03   ------------------------------------------------------------------------------------------------------------------  RADIOLOGY:  Dg Chest Port 1 View  11/14/2014  CLINICAL DATA:  [age] year old with acute onset of severe shortness of breath shortness of breath (COPD exacerbation). Hypokalemia on laboratory analysis. EXAM: PORTABLE CHEST 1 VIEW COMPARISON:  02/23/2014 and earlier. FINDINGS: Cardiac silhouette mildly enlarged, unchanged. Thoracic aorta atherosclerotic, unchanged. Hilar and mediastinal contours otherwise unremarkable. Scarring in the lung bases, unchanged. Biapical pleuroparenchymal scarring, unchanged. Prominent bronchovascular markings diffusely and mild to moderate central peribronchial thickening, unchanged. No new pulmonary parenchymal abnormalities. IMPRESSION: 1.  No acute cardiopulmonary disease. 2. Stable mild cardiomegaly without pulmonary edema. 3. Stable mild to moderate changes of chronic bronchitis and/or asthma. Electronically Signed   By: Hulan Saashomas  Lawrence M.D.   On: 11/14/2014  18:19    EKG:   Orders placed or performed during the hospital encounter of 11/14/14  . ED EKG  .  ED EKG  . EKG 12-Lead  . EKG 12-Lead    ASSESSMENT AND PLAN:    79 year old female patient came in because of shortness of breath found to have acute on chronic respiratory failure secondary to COPD exacerbation. Patient still has shortness of breath, severe wheezing, cough. I have repeated chest x-ray, ordered nebulizers every 4 hours, continue IV Solu-Medrol at a higher dose, continue theophylline, continue Rocephin, Zithromax, obtain pulmonary consult with Dr. Nickolas Madrid.   #2 hyperlipidemia continue lovastatin 40 mg by mouth daily #3 coronary artery disease) stable continue aspirin, Plavix, statins. 4.Hypothyroidism continue Synthroid. 5 hypokalemia replace the potassium and repeat potassium is still low at 3.3, continue supplementation.   All the records are reviewed and case discussed with Care Management/Social Workerr. Management plans discussed with the patient, family and they are in agreement.  CODE STATUS: full TOTAL TIME TAKING CARE OF THIS PATIENT: 35 minutes.   POSSIBLE D/C IN 1-2DAYS, DEPENDING ON CLINICAL CONDITION.   Katha Hamming M.D on 11/15/2014 at 11:24 AM  Between 7am to 6pm - Pager - [phone]  After 6pm go to [email or web address] - password [email or web address] [facility]  [place]  [facility]  Office  [phone]  CC: Primary care physician; Lorie Phenix, MD   Note: This dictation was prepared with Dragon dictation along with smaller phrase technology. Any transcriptional errors that result from this process are unintentional.

## 2014-11-15 NOTE — Care Management (Signed)
Spoke with patient for discharge planning. Patient is from home alone with local family support. Has o2 with [employer], and nebulizer machine. Does not use a walker or cane at home and stated that she still drives herself to appointments. PCP is Dr Elease HashimotoMaloney. Continue to follow for discharge needs.

## 2014-11-16 MED ORDER — METHYLPREDNISOLONE SODIUM SUCC 125 MG IJ SOLR
60.0000 mg | Freq: Two times a day (BID) | INTRAMUSCULAR | Status: DC
  Administered 2014-11-17 (×2): 60 mg via INTRAVENOUS
  Filled 2014-11-16 (×2): qty 2

## 2014-11-16 NOTE — Progress Notes (Signed)
[facility] - [place] at [facility]   PATIENT NAME: Kelina Beauchamp    MR#:  [id number]  DATE OF BIRTH:  08-Jul-1935  SUBJECTIVE: Admitted for COPD exacerbation.had shortness of breath, cough yesterday but now she feels better today. No shortness of breath, minimal cough.   CHIEF COMPLAINT:   Chief Complaint  Patient presents with  . Shortness of Breath    REVIEW OF SYSTEMS:   ROS CONSTITUTIONAL: No fever, fatigue or weakness.  EYES: No blurred or double vision.  EARS, NOSE, AND THROAT: No tinnitus or ear pain.  RESPIRATORY: No cough, shortness of breath, wheezing or hemoptysis.  CARDIOVASCULAR: No chest pain, orthopnea, edema.  GASTROINTESTINAL: No nausea, vomiting, diarrhea or abdominal pain.  GENITOURINARY: No dysuria, hematuria.  ENDOCRINE: No polyuria, nocturia,  HEMATOLOGY: No anemia, easy bruising or bleeding SKIN: No rash or lesion. MUSCULOSKELETAL: No joint pain or arthritis.   NEUROLOGIC: No tingling, numbness, weakness.  PSYCHIATRY: No anxiety or depression.   DRUG ALLERGIES:   Allergies  Allergen Reactions  . Codeine Nausea And Vomiting  . Fluticasone-Salmeterol Other (See Comments)    Reaction:  Unknown   . Levofloxacin Other (See Comments)    Reaction:  Confusion and hallucinations   . Tiotropium Bromide Monohydrate Rash    VITALS:  Blood pressure 140/102, pulse 75, temperature 97.9 F (36.6 C), temperature source Oral, resp. rate 16, height  (1.626 m), weight 66.271 kg (146 lb 1.6 oz), SpO2 96 %.  PHYSICAL EXAMINATION:  GENERAL:  79 y.o.-year-old patient lying in the bed with no acute distress.  EYES: Pupils equal, round, reactive to light and accommodation. No scleral icterus. Extraocular muscles intact.  HEENT: Head atraumatic, normocephalic. Oropharynx and nasopharynx clear.  NECK:  Supple, no jugular venous distention. No thyroid enlargement, no tenderness.  LUNGS: Bilateral expiratory wheeze in all lung fields   CARDIOVASCULAR: S1, S2 normal. No murmurs, rubs, or gallops.  ABDOMEN: Soft, nontender, nondistended. Bowel sounds present. No organomegaly or mass.  EXTREMITIES: No pedal edema, cyanosis, or clubbing.  NEUROLOGIC: Cranial nerves II through XII are intact. Muscle strength 5/5 in all extremities. Sensation intact. Gait not checked.  PSYCHIATRIC: The patient is alert and oriented x 3.  SKIN: No obvious rash, lesion, or ulcer.    LABORATORY PANEL:   CBC  Recent Labs Lab 11/15/14 0358  WBC 15.9*  HGB 11.3*  HCT 34.8*  PLT 390   ------------------------------------------------------------------------------------------------------------------  Chemistries   Recent Labs Lab 11/15/14 0358  NA 145  K 3.3*  CL 107  CO2 28  GLUCOSE 153*  BUN 17  CREATININE 0.86  CALCIUM 9.0   ------------------------------------------------------------------------------------------------------------------  Cardiac Enzymes  Recent Labs Lab 11/14/14 1723  TROPONINI <0.03   ------------------------------------------------------------------------------------------------------------------  RADIOLOGY:  Dg Chest 1 View  11/15/2014  CLINICAL DATA:  Shortness of breath. Productive cough. COPD. Unstable angina. EXAM: CHEST 1 VIEW COMPARISON:  11/14/2014 FINDINGS: Mild cardiomegaly remains stable. Pulmonary hyperinflation again seen. Mild bibasilar pleural thickening remains stable. No evidence of acute infiltrate or edema. IMPRESSION: Mild cardiomegaly and COPD.  No acute findings. Electronically Signed   By: Myles Rosenthal M.D.   On: 11/15/2014 12:33   Dg Chest Port 1 View  11/14/2014  CLINICAL DATA:  79 year old with acute onset of severe shortness of breath shortness of breath (COPD exacerbation). Hypokalemia on laboratory analysis. EXAM: PORTABLE CHEST 1 VIEW COMPARISON:  02/23/2014 and earlier. FINDINGS: Cardiac silhouette mildly enlarged, unchanged. Thoracic aorta atherosclerotic, unchanged. Hilar  and mediastinal contours otherwise unremarkable. Scarring in the  lung bases, unchanged. Biapical pleuroparenchymal scarring, unchanged. Prominent bronchovascular markings diffusely and mild to moderate central peribronchial thickening, unchanged. No new pulmonary parenchymal abnormalities. IMPRESSION: 1.  No acute cardiopulmonary disease. 2. Stable mild cardiomegaly without pulmonary edema. 3. Stable mild to moderate changes of chronic bronchitis and/or asthma. Electronically Signed   By: Hulan Saashomas  Lawrence M.D.   On: 11/14/2014 18:19    EKG:   Orders placed or performed during the hospital encounter of 11/14/14  . ED EKG  . ED EKG  . EKG 12-Lead  . EKG 12-Lead    ASSESSMENT AND PLAN:    79 year old female patient came in because of shortness of breath found to have acute on chronic respiratory failure secondary to COPD exacerbation. Clinically better today. Continue IV antibiotics, nebulizers, decrease the dose of IV steroids. Repeat chest x-ray did not show any pneumonia yesterday. Patient clinically improving, most likely discharged over the weekend. #2 hyperlipidemia continue lovastatin 40 mg by mouth daily #3 coronary artery disease) stable continue aspirin, Plavix, statins. 4.Hypothyroidism continue Synthroid. 5 hypokalemia; replaced. All the records are reviewed and case discussed with Care Management/Social Workerr. Management plans discussed with the patient, family and they are in agreement.  CODE STATUS: full TOTAL TIME TAKING CARE OF THIS PATIENT: 35 minutes.   POSSIBLE D/C IN 1-2DAYS, DEPENDING ON CLINICAL CONDITION.   Katha HammingKONIDENA,Dewarren Ledbetter M.D on 11/16/2014 at 1:14 PM  Between 7am to 6pm - Pager - [phone]  After 6pm go to [email or web address] - password [email or web address] [facility]  [place] [facility] [place] [facility]  Office  [phone]  CC: Primary care physician; Lorie PhenixNancy Maloney, MD   Note: This dictation was prepared with Dragon dictation along with smaller phrase technology. Any  transcriptional errors that result from this process are unintentional.

## 2014-11-16 NOTE — Care Management Important Message (Signed)
Important Message  Patient Details  Name: Samantha Goodman MRN: [id number] Date of Birth: 01/02/1936   Medicare Important Message Given:  Yes-second notification given    Adonis HugueninBerkhead, Nickie Deren L, RN 11/16/2014, 11:23 AM

## 2014-11-16 NOTE — Progress Notes (Signed)
[facility] - [place] at [facility]   PATIENT NAME: Samantha Goodman    MR#:  [id number]  DATE OF BIRTH:  May 30, 1935  SUBJECTIVE: Nicole Kindred of breath, wheezing today. Admitted for COPD and elevation. Having severe cough, shortness of breath.   CHIEF COMPLAINT:   Chief Complaint  Patient presents with  . Shortness of Breath    REVIEW OF SYSTEMS:   Review of Systems  Respiratory: Positive for cough, shortness of breath and wheezing.    CONSTITUTIONAL: No fever, fatigue or weakness.  EYES: No blurred or double vision.  EARS, NOSE, AND THROAT: No tinnitus or ear pain.  RESPIRATORY ;cough,shortness of breath, wheezing. CARDIOVASCULAR: No chest pain, orthopnea, edema.  GASTROINTESTINAL: No nausea, vomiting, diarrhea or abdominal pain.  GENITOURINARY: No dysuria, hematuria.  ENDOCRINE: No polyuria, nocturia,  HEMATOLOGY: No anemia, easy bruising or bleeding SKIN: No rash or lesion. MUSCULOSKELETAL: No joint pain or arthritis.   NEUROLOGIC: No tingling, numbness, weakness.  PSYCHIATRY: No anxiety or depression.   DRUG ALLERGIES:   Allergies  Allergen Reactions  . Codeine Nausea And Vomiting  . Fluticasone-Salmeterol Other (See Comments)    Reaction:  Unknown   . Levofloxacin Other (See Comments)    Reaction:  Confusion and hallucinations   . Tiotropium Bromide Monohydrate Rash    VITALS:  Blood pressure 140/102, pulse 75, temperature 97.9 F (36.6 C), temperature source Oral, resp. rate 16, height  (1.626 m), weight 66.271 kg (146 lb 1.6 oz), SpO2 96 %.  PHYSICAL EXAMINATION:  GENERAL:  79 y.o.-year-old patient lying in the bed with no acute distress.  EYES: Pupils equal, round, reactive to light and accommodation. No scleral icterus. Extraocular muscles intact.  HEENT: Head atraumatic, normocephalic. Oropharynx and nasopharynx clear.  NECK:  Supple, no jugular venous distention. No thyroid enlargement, no tenderness.  LUNGS: Has audible wheeze,  bilateral expiratory wheeze. CARDIOVASCULAR: S1, S2 normal. No murmurs, rubs, or gallops.  ABDOMEN: Soft, nontender, nondistended. Bowel sounds present. No organomegaly or mass.  EXTREMITIES: No pedal edema, cyanosis, or clubbing.  NEUROLOGIC: Cranial nerves II through XII are intact. Muscle strength 5/5 in all extremities. Sensation intact. Gait not checked.  PSYCHIATRIC: The patient is alert and oriented x 3.  SKIN: No obvious rash, lesion, or ulcer.    LABORATORY PANEL:   CBC  Recent Labs Lab 11/15/14 0358  WBC 15.9*  HGB 11.3*  HCT 34.8*  PLT 390   ------------------------------------------------------------------------------------------------------------------  Chemistries   Recent Labs Lab 11/15/14 0358  NA 145  K 3.3*  CL 107  CO2 28  GLUCOSE 153*  BUN 17  CREATININE 0.86  CALCIUM 9.0   ------------------------------------------------------------------------------------------------------------------  Cardiac Enzymes  Recent Labs Lab 11/14/14 1723  TROPONINI <0.03   ------------------------------------------------------------------------------------------------------------------  RADIOLOGY:  Dg Chest 1 View  11/15/2014  CLINICAL DATA:  Shortness of breath. Productive cough. COPD. Unstable angina. EXAM: CHEST 1 VIEW COMPARISON:  11/14/2014 FINDINGS: Mild cardiomegaly remains stable. Pulmonary hyperinflation again seen. Mild bibasilar pleural thickening remains stable. No evidence of acute infiltrate or edema. IMPRESSION: Mild cardiomegaly and COPD.  No acute findings. Electronically Signed   By: Myles Rosenthal M.D.   On: 11/15/2014 12:33   Dg Chest Port 1 View  11/14/2014  CLINICAL DATA:  79 year old with acute onset of severe shortness of breath shortness of breath (COPD exacerbation). Hypokalemia on laboratory analysis. EXAM: PORTABLE CHEST 1 VIEW COMPARISON:  02/23/2014 and earlier. FINDINGS: Cardiac silhouette mildly enlarged, unchanged. Thoracic aorta  atherosclerotic, unchanged. Hilar and mediastinal contours otherwise unremarkable.  Scarring in the lung bases, unchanged. Biapical pleuroparenchymal scarring, unchanged. Prominent bronchovascular markings diffusely and mild to moderate central peribronchial thickening, unchanged. No new pulmonary parenchymal abnormalities. IMPRESSION: 1.  No acute cardiopulmonary disease. 2. Stable mild cardiomegaly without pulmonary edema. 3. Stable mild to moderate changes of chronic bronchitis and/or asthma. Electronically Signed   By: Hulan Saashomas  Lawrence M.D.   On: 11/14/2014 18:19    EKG:   Orders placed or performed during the hospital encounter of 11/14/14  . ED EKG  . ED EKG  . EKG 12-Lead  . EKG 12-Lead    ASSESSMENT AND PLAN:    79 year old female patient came in because of shortness of breath found to have acute on chronic respiratory failure secondary to COPD exacerbation. Patient still has shortness of breath, severe wheezing, cough. I have repeated chest x-ray, ordered nebulizers every 4 hours, continue IV Solu-Medrol at a higher dose, continue theophylline, continue Rocephin, Zithromax, obtain pulmonary consult with Dr. Nickolas MadridSadat Khan.   #2 hyperlipidemia continue lovastatin 40 mg by mouth daily #3 coronary artery disease) stable continue aspirin, Plavix, statins. 4.Hypothyroidism continue Synthroid. 5 hypokalemia replace the potassium and repeat potassium is still low at 3.3, continue supplementation.   All the records are reviewed and case discussed with Care Management/Social Workerr. Management plans discussed with the patient, family and they are in agreement.  CODE STATUS: full TOTAL TIME TAKING CARE OF THIS PATIENT: 35 minutes.   POSSIBLE D/C IN 1-2DAYS, DEPENDING ON CLINICAL CONDITION.   Katha HammingKONIDENA,Adana Marik M.D on 11/16/2014 at 1:17 PM  Between 7am to 6pm - Pager - [phone]  After 6pm go to [email or web address] - password [email or web address] [facility]  [facility]  Office   [phone]  CC: Primary care physician; Lorie PhenixNancy Maloney, MD   Note: This dictation was prepared with Dragon dictation along with smaller phrase technology. Any transcriptional errors that result from this process are unintentional.

## 2014-11-17 LAB — CBC
HEMATOCRIT: 34.3 % — AB (ref 35.0–47.0)
HEMOGLOBIN: 11.2 g/dL — AB (ref 12.0–16.0)
MCH: 27.2 pg (ref 26.0–34.0)
MCHC: 32.7 g/dL (ref 32.0–36.0)
MCV: 83.1 fL (ref 80.0–100.0)
Platelets: 401 10*3/uL (ref 150–440)
RBC: 4.12 MIL/uL (ref 3.80–5.20)
RDW: 14.3 % (ref 11.5–14.5)
WBC: 10.5 10*3/uL (ref 3.6–11.0)

## 2014-11-17 LAB — BASIC METABOLIC PANEL
Anion gap: 7 (ref 5–15)
BUN: 22 mg/dL — AB (ref 6–20)
CHLORIDE: 106 mmol/L (ref 101–111)
CO2: 30 mmol/L (ref 22–32)
Calcium: 8.7 mg/dL — ABNORMAL LOW (ref 8.9–10.3)
Creatinine, Ser: 0.87 mg/dL (ref 0.44–1.00)
GFR calc Af Amer: >60 mL/min (ref >60)
GFR calc non Af Amer: >60 mL/min (ref >60)
GLUCOSE: 149 mg/dL — AB (ref 65–99)
POTASSIUM: 3.6 mmol/L (ref 3.5–5.1)
Sodium: 143 mmol/L (ref 135–145)

## 2014-11-17 MED ORDER — BENZONATATE 200 MG PO CAPS: 200.0000 mg | ORAL_CAPSULE | Freq: Two times a day (BID) | ORAL | Status: DC | PRN

## 2014-11-17 MED ORDER — GUAIFENESIN ER 600 MG PO TB12: 600.0000 mg | ORAL_TABLET | Freq: Two times a day (BID) | ORAL | Status: AC

## 2014-11-17 MED ORDER — AZITHROMYCIN 250 MG PO TABS: ORAL_TABLET | ORAL | Status: DC

## 2014-11-17 MED ORDER — PREDNISONE 50 MG PO TABS: ORAL_TABLET | ORAL | Status: DC

## 2014-11-17 NOTE — Care Management Note (Signed)
Case Management Note  Patient Details  Name: Samantha Goodman MRN: [id number] Date of Birth: 11/28/1935  Subjective/Objective:     Samantha Goodman reports that she has used [facility] in the past and would like to use them again. Discharge orders requesting home health RN and Nurse Aid were faxed and called to [facility].                Action/Plan:   Expected Discharge Date:                  Expected Discharge Plan:     In-House Referral:     Discharge planning Services     Post Acute Care Choice:    Choice offered to:     DME Arranged:    DME Agency:     HH Arranged:    HH Agency:     Status of Service:     Medicare Important Message Given:  Yes-second notification given Date Medicare IM Given:    Medicare IM give by:    Date Additional Medicare IM Given:    Additional Medicare Important Message give by:     If discussed at Long Length of Stay Meetings, dates discussed:    Additional Comments:  [name] A, RN 11/17/2014, 12:00 PM

## 2014-11-17 NOTE — Discharge Instructions (Signed)
°  DIET:  °Cardiac diet  ° °DISCHARGE CONDITION:  °Fair ° °ACTIVITY:  °Activity as tolerated ° °OXYGEN:  °Home Oxygen: Yes.   °  °Oxygen Delivery: 2 liters/min via Patient connected to nasal cannula oxygen ° °DISCHARGE LOCATION:  °home  ° °If you experience worsening of your admission symptoms, develop shortness of breath, life threatening emergency, suicidal or homicidal thoughts you must seek medical attention immediately by calling 911 or calling your MD immediately  if symptoms less severe. ° °You Must read complete instructions/literature along with all the possible adverse reactions/side effects for all the Medicines you take and that have been prescribed to you. Take any new Medicines after you have completely understood and accpet all the possible adverse reactions/side effects.  ° °Please note ° °You were cared for by a hospitalist during your hospital stay. If you have any questions about your discharge medications or the care you received while you were in the hospital after you are discharged, you can call the unit and asked to speak with the hospitalist on call if the hospitalist that took care of you is not available. Once you are discharged, your primary care physician will handle any further medical issues. Please note that NO REFILLS for any discharge medications will be authorized once you are discharged, as it is imperative that you return to your primary care physician (or establish a relationship with a primary care physician if you do not have one) for your aftercare needs so that they can reassess your need for medications and monitor your lab values. ° ° °

## 2014-11-17 NOTE — Consult Note (Signed)
Pulmonary Critical Care  Initial Consult Note   [name] AVW:[id number] DOB: May 18, 1935 DOA: 11/14/2014  Referring physician: Neldon Newport, MD PCP: Lorie Phenix, MD   Chief Complaint: Shortness of Breath  HPI: Samantha Goodman is a 79 y.o. female known to me from the office. Patient presented with increased shortness of breath. She was found to have a COPD exacerbation. She was started on steroids and since then has improved. Patient had some cough noted. She had no hemoptysis noted. She has no chest pain noted. No fevers or chills noted   Review of Systems:  Complete ROS performed and is unremarkable other than HPI  Past Medical History  Diagnosis Date  . COPD (chronic obstructive pulmonary disease) (HCC)   . Asthma   . CAD (coronary artery disease)   . Unstable angina pectoris (HCC)   . Hyperlipemia   . Agitation   . Fall   . Hypothyroid    Past Surgical History  Procedure Laterality Date  . Eye surgery    . Abdominal hysterectomy    . Appendectomy    . Ankle surgery Right    Social History:  reports that she quit smoking about a year ago. She has never used smokeless tobacco. She reports that she does not drink alcohol or use illicit drugs.  Allergies  Allergen Reactions  . Codeine Nausea And Vomiting  . Fluticasone-Salmeterol Other (See Comments)    Reaction:  Unknown   . Levofloxacin Other (See Comments)    Reaction:  Confusion and hallucinations   . Tiotropium Bromide Monohydrate Rash    Family History  Problem Relation Age of Onset  . Stroke Mother   . Prostate cancer Father   . CAD Brother   . Ovarian cancer Sister   . Breast cancer Sister   . Lung cancer Sister     Prior to Admission medications   Medication Sig Start Date End Date Taking? Authorizing Provider  acetaminophen (TYLENOL) 500 MG tablet Take 1,000 mg by mouth every 6 (six) hours as needed for mild pain.    Yes Historical Provider, MD  aspirin EC 81 MG tablet Take 81 mg by mouth daily.    Yes Historical Provider, MD  atorvastatin (LIPITOR) 40 MG tablet Take 40 mg by mouth at bedtime.   Yes Historical Provider, MD  budesonide-formoterol (SYMBICORT) 160-4.5 MCG/ACT inhaler Inhale 2 puffs into the lungs 2 (two) times daily.   Yes Historical Provider, MD  cholecalciferol (VITAMIN D) 1000 UNITS tablet Take 1,000 Units by mouth daily.   Yes Historical Provider, MD  citalopram (CELEXA) 20 MG tablet Take 1 tablet (20 mg total) by mouth daily. 10/16/14  Yes Lorie Phenix, MD  clopidogrel (PLAVIX) 75 MG tablet Take 75 mg by mouth at bedtime.   Yes Historical Provider, MD  ferrous sulfate 325 (65 FE) MG tablet Take 325 mg by mouth at bedtime.   Yes Historical Provider, MD  fluticasone (FLONASE) 50 MCG/ACT nasal spray Place 2 sprays into both nostrils daily as needed for rhinitis.   Yes Historical Provider, MD  furosemide (LASIX) 20 MG tablet Take 1 tablet (20 mg total) by mouth daily. 10/16/14  Yes Lorie Phenix, MD  guaiFENesin (MUCINEX) 600 MG 12 hr tablet Take 600 mg by mouth daily.   Yes Historical Provider, MD  ipratropium-albuterol (DUONEB) 0.5-2.5 (3) MG/3ML SOLN Take 3 mLs by nebulization 4 (four) times daily as needed (for shortness of breath/wheezing).   Yes Historical Provider, MD  levothyroxine (SYNTHROID, LEVOTHROID) 88 MCG tablet  Take 88 mcg by mouth daily.   Yes Historical Provider, MD  loratadine (CLARITIN) 10 MG tablet Take 10 mg by mouth daily.   Yes Historical Provider, MD  metoprolol succinate (TOPROL-XL) 25 MG 24 hr tablet Take 12.5 mg by mouth daily.   Yes Historical Provider, MD  montelukast (SINGULAIR) 10 MG tablet Take 1 tablet (10 mg total) by mouth daily. 08/22/14  Yes Lorie Phenix, MD  ondansetron (ZOFRAN) 4 MG tablet Take 1 tablet (4 mg total) by mouth every 8 (eight) hours as needed for nausea or vomiting. 08/22/14  Yes Lorie Phenix, MD  pantoprazole (PROTONIX) 40 MG tablet Take 40 mg by mouth daily.   Yes Historical Provider, MD  Potassium 99 MG TABS Take 99 mg by  mouth daily.    Yes Historical Provider, MD  PROAIR HFA 108 (90 BASE) MCG/ACT inhaler Inhale 2 puffs into the lungs every 4 (four) hours as needed for wheezing or shortness of breath. 10/29/14  Yes Lorie Phenix, MD  theophylline (THEO-24) 200 MG 24 hr capsule Take 200 mg by mouth daily.   Yes Historical Provider, MD  azithromycin (ZITHROMAX) 250 MG tablet Take one tablet daily for 5 days and then stop. 11/17/14   Gale Journey, MD  benzonatate (TESSALON) 200 MG capsule Take 1 capsule (200 mg total) by mouth 2 (two) times daily as needed for cough. 11/17/14   Gale Journey, MD  guaiFENesin (MUCINEX) 600 MG 12 hr tablet Take 1 tablet (600 mg total) by mouth 2 (two) times daily. 11/17/14   Gale Journey, MD  predniSONE (DELTASONE) 50 MG tablet Take one tablet daily for 5 days and then stop. 11/17/14   Gale Journey, MD   Physical Exam: Daiva Eves:   11/16/14 2329 11/17/14 0428 11/17/14 0609 11/17/14 0728  BP:   103/46   Pulse:   78   Temp:   97.7 F (36.5 C)   TempSrc:   Oral   Resp:   18   Height:      Weight:      SpO2: 94% 95% 96% 95%    Wt Readings from Last 3 Encounters:  11/14/14 66.271 kg (146 lb 1.6 oz)  10/16/14 68.04 kg (150 lb)  07/13/14 68.04 kg (150 lb)    General:  Appears calm and comfortable Eyes: PERRL, normal lids, irises & conjunctiva ENT: grossly normal hearing, lips & tongue Neck: no LAD, masses or thyromegaly Cardiovascular: RRR, no m/r/g. No LE edema. Respiratory: CTA bilaterally, no w/r/r Abdomen: soft, nontender Skin: no rash or induration seen on limited exam Musculoskeletal: grossly normal tone BUE/BLE Psychiatric: grossly normal mood and affect Neurologic: grossly non-focal.          Labs on Admission:  Basic Metabolic Panel:  Recent Labs Lab 11/14/14 1723 11/14/14 2048 11/15/14 0358 11/17/14 0517  NA 141  --  145 143  K 2.9*  --  3.3* 3.6  CL 98*  --  107 106  CO2 29  --  28 30  GLUCOSE 164*  --  153* 149*  BUN 18  --   17 22*  CREATININE 0.87 0.85 0.86 0.87  CALCIUM 9.1  --  9.0 8.7*   Liver Function Tests: No results for input(s): AST, ALT, ALKPHOS, BILITOT, PROT, ALBUMIN in the last 168 hours. No results for input(s): LIPASE, AMYLASE in the last 168 hours. No results for input(s): AMMONIA in the last 168 hours. CBC:  Recent Labs Lab 11/14/14 1723 11/15/14 0358 11/17/14 1610  WBC 15.1* 15.9* 10.5  NEUTROABS 12.8*  --   --   HGB 11.7* 11.3* 11.2*  HCT 35.7 34.8* 34.3*  MCV 82.4 83.1 83.1  PLT 421 390 401   Cardiac Enzymes:  Recent Labs Lab 11/14/14 1723  TROPONINI <0.03    BNP (last 3 results)  Recent Labs  12/04/13 1950 02/03/14 1721  BNP 620* 263    ProBNP (last 3 results) No results for input(s): PROBNP in the last 8760 hours.  CBG: No results for input(s): GLUCAP in the last 168 hours.  Radiological Exams on Admission: No results found.  EKG: Independently reviewed.  Assessment/Plan Active Problems:   Acute exacerbation of chronic obstructive pulmonary disease (COPD) (HCC)   1. COPD with exacerbation -she is clinically doing much better -agree with current management -continue with oxygen as ordered -should follow up in office after discharge      Time spent: 60min    I have personally obtained a history, examined the patient, evaluated laboratory and imaging results, formulated the assessment and plan and placed orders.  The Patient requires high complexity decision making for assessment and support.    Yevonne PaxSaadat A Khan, MD [facility] Pulmonary Critical Care Medicine Sleep Medicine

## 2014-11-19 ENCOUNTER — Ambulatory Visit: Payer: Self-pay | Admitting: Family Medicine

## 2014-11-19 NOTE — Discharge Summary (Signed)
[facility] [place] at [facility]  DISCHARGE SUMMARY   PATIENT NAME: Samantha Goodman    MR#:  [id number]  DATE OF BIRTH:  [birth year]  DATE OF ADMISSION:  11/14/2014 ADMITTING PHYSICIAN: Auburn Bilberry, MD  DATE OF DISCHARGE: 11/17/2014  PRIMARY CARE PHYSICIAN: Lorie Phenix, MD    ADMISSION DIAGNOSIS:  Hypoxia [R09.02] COPD exacerbation (HCC) [J44.1]  DISCHARGE DIAGNOSIS:  Active Problems:   Acute exacerbation of chronic obstructive pulmonary disease (COPD) (HCC)   SECONDARY DIAGNOSIS:   Past Medical History  Diagnosis Date  . COPD (chronic obstructive pulmonary disease) (HCC)   . Asthma   . CAD (coronary artery disease)   . Unstable angina pectoris (HCC)   . Hyperlipemia   . Agitation   . Fall   . Hypothyroid     HOSPITAL COURSE:    79 year old female patient came in because of shortness of breath found to have acute on chronic respiratory failure secondary to COPD exacerbation.   #1 acute COPD exacerbation with acute on chronic respiratory failure: She improved quickly on IV Solu-Medrol, antibiotics, nebulizer treatments. Chest x-ray was clear. She is back to her baseline oxygen requirement. She is being discharged on prednisone, azithromycin as well as her previous regimen of inhalers. She will follow-up with her pulmonologist in the outpatient setting. She states that she feels much better and is ready to go home.  #2 hyperlipidemia continue lovastatin 40 mg by mouth daily #3 coronary artery disease) stable continue aspirin, Plavix, statins. 4.Hypothyroidism continue Synthroid. 5 hypokalemia; replaced.  DISCHARGE CONDITIONS:   Fair  CONSULTS OBTAINED:  Treatment Team:  Auburn Bilberry, MD  DRUG ALLERGIES:   Allergies  Allergen Reactions  . Codeine Nausea And Vomiting  . Fluticasone-Salmeterol Other (See Comments)    Reaction:  Unknown   . Levofloxacin Other (See Comments)    Reaction:  Confusion and hallucinations   .  Tiotropium Bromide Monohydrate Rash    DISCHARGE MEDICATIONS:   Discharge Medication List as of 11/17/2014  1:24 PM    START taking these medications   Details  azithromycin (ZITHROMAX) 250 MG tablet Take one tablet daily for 5 days and then stop., Print    benzonatate (TESSALON) 200 MG capsule Take 1 capsule (200 mg total) by mouth 2 (two) times daily as needed for cough., Starting 11/17/2014, Until Discontinued, Print    !! guaiFENesin (MUCINEX) 600 MG 12 hr tablet Take 1 tablet (600 mg total) by mouth 2 (two) times daily., Starting 11/17/2014, Until Discontinued, Print    predniSONE (DELTASONE) 50 MG tablet Take one tablet daily for 5 days and then stop., Print     !! - Potential duplicate medications found. Please discuss with provider.    CONTINUE these medications which have NOT CHANGED   Details  acetaminophen (TYLENOL) 500 MG tablet Take 1,000 mg by mouth every 6 (six) hours as needed for mild pain. , Until Discontinued, Historical Med    aspirin EC 81 MG tablet Take 81 mg by mouth daily., Until Discontinued, Historical Med    atorvastatin (LIPITOR) 40 MG tablet Take 40 mg by mouth at bedtime., Until Discontinued, Historical Med    budesonide-formoterol (SYMBICORT) 160-4.5 MCG/ACT inhaler Inhale 2 puffs into the lungs 2 (two) times daily., Until Discontinued, Historical Med    cholecalciferol (VITAMIN D) 1000 UNITS tablet Take 1,000 Units by mouth daily., Until Discontinued, Historical Med    citalopram (CELEXA) 20 MG tablet Take 1 tablet (20 mg total) by mouth daily., Starting 10/16/2014, Until Discontinued, Normal  clopidogrel (PLAVIX) 75 MG tablet Take 75 mg by mouth at bedtime., Until Discontinued, Historical Med    ferrous sulfate 325 (65 FE) MG tablet Take 325 mg by mouth at bedtime., Until Discontinued, Historical Med    fluticasone (FLONASE) 50 MCG/ACT nasal spray Place 2 sprays into both nostrils daily as needed for rhinitis., Until Discontinued, Historical Med     furosemide (LASIX) 20 MG tablet Take 1 tablet (20 mg total) by mouth daily., Starting 10/16/2014, Until Discontinued, Normal    !! guaiFENesin (MUCINEX) 600 MG 12 hr tablet Take 600 mg by mouth daily., Until Discontinued, Historical Med    ipratropium-albuterol (DUONEB) 0.5-2.5 (3) MG/3ML SOLN Take 3 mLs by nebulization 4 (four) times daily as needed (for shortness of breath/wheezing)., Until Discontinued, Historical Med    levothyroxine (SYNTHROID, LEVOTHROID) 88 MCG tablet Take 88 mcg by mouth daily., Until Discontinued, Historical Med    loratadine (CLARITIN) 10 MG tablet Take 10 mg by mouth daily., Until Discontinued, Historical Med    metoprolol succinate (TOPROL-XL) 25 MG 24 hr tablet Take 12.5 mg by mouth daily., Until Discontinued, Historical Med    montelukast (SINGULAIR) 10 MG tablet Take 1 tablet (10 mg total) by mouth daily., Starting 08/22/2014, Until Discontinued, Normal    ondansetron (ZOFRAN) 4 MG tablet Take 1 tablet (4 mg total) by mouth every 8 (eight) hours as needed for nausea or vomiting., Starting 08/22/2014, Until Discontinued, Normal    pantoprazole (PROTONIX) 40 MG tablet Take 40 mg by mouth daily., Until Discontinued, Historical Med    Potassium 99 MG TABS Take 99 mg by mouth daily. , Until Discontinued, Historical Med    PROAIR HFA 108 (90 BASE) MCG/ACT inhaler Inhale 2 puffs into the lungs every 4 (four) hours as needed for wheezing or shortness of breath., Starting 10/29/2014, Until Discontinued, Normal    theophylline (THEO-24) 200 MG 24 hr capsule Take 200 mg by mouth daily., Until Discontinued, Historical Med     !! - Potential duplicate medications found. Please discuss with provider.       DISCHARGE INSTRUCTIONS:    Fair condition. On chronic home oxygen which has been restarted. Heart healthy cardiac diet. No other home health needs.  If you experience worsening of your admission symptoms, develop shortness of breath, life threatening emergency,  suicidal or homicidal thoughts you must seek medical attention immediately by calling 911 or calling your MD immediately  if symptoms less severe.  You Must read complete instructions/literature along with all the possible adverse reactions/side effects for all the Medicines you take and that have been prescribed to you. Take any new Medicines after you have completely understood and accept all the possible adverse reactions/side effects.   Please note  You were cared for by a hospitalist during your hospital stay. If you have any questions about your discharge medications or the care you received while you were in the hospital after you are discharged, you can call the unit and asked to speak with the hospitalist on call if the hospitalist that took care of you is not available. Once you are discharged, your primary care physician will handle any further medical issues. Please note that NO REFILLS for any discharge medications will be authorized once you are discharged, as it is imperative that you return to your primary care physician (or establish a relationship with a primary care physician if you do not have one) for your aftercare needs so that they can reassess your need for medications and monitor your lab  values.    Today   CHIEF COMPLAINT:   Chief Complaint  Patient presents with  . Shortness of Breath    HISTORY OF PRESENT ILLNESS:  Samantha Goodman is a 79 y.o. female with a known history of COPD on chronic oxygen at 2 L, coronary artery disease, hyperlipidemia who states that she has chronic shortness of breath but since last night her breathing has gotten worse. She had to increase her oxygen to 2-1/2 L and now to 3 L. She has been having significant amount of coughing which is productive of whitish sputum. She has not checked her temperature but has not felt feverish. She denies any chest pain or palpitations. Denies any lower extremity swelling.  VITAL SIGNS:  Blood pressure  103/46, pulse 78, temperature 97.7 F (36.5 C), temperature source Oral, resp. rate 18, height 5\' 4"  (1.626 m), weight 66.271 kg (146 lb 1.6 oz), SpO2 95 %.  I/O:  No intake or output data in the 24 hours ending 11/19/14 1621  PHYSICAL EXAMINATION:  GENERAL:  79 y.o.-year-old patient lying in the bed with no acute distress.  LUNGS: Normal breath sounds bilaterally, no wheezing, rales,rhonchi or crepitation. No use of accessory muscles of respiration.  CARDIOVASCULAR: S1, S2 normal. No murmurs, rubs, or gallops.  ABDOMEN: Soft, non-tender, non-distended. Bowel sounds present. No organomegaly or mass.  EXTREMITIES: No pedal edema, cyanosis, or clubbing.  NEUROLOGIC: Cranial nerves II through XII are intact. Muscle strength 5/5 in all extremities. Sensation intact. Gait not checked.  PSYCHIATRIC: The patient is alert and oriented x 3.  SKIN: No obvious rash, lesion, or ulcer.   DATA REVIEW:   CBC  Recent Labs Lab 11/17/14 0517  WBC 10.5  HGB 11.2*  HCT 34.3*  PLT 401    Chemistries   Recent Labs Lab 11/17/14 0517  NA 143  K 3.6  CL 106  CO2 30  GLUCOSE 149*  BUN 22*  CREATININE 0.87  CALCIUM 8.7*    Cardiac Enzymes  Recent Labs Lab 11/14/14 1723  TROPONINI <0.03    Microbiology Results  Results for orders placed or performed in visit on 02/03/14  Culture, blood (single)     Status: None   Collection Time: 02/03/14  5:24 PM  Result Value Ref Range Status   Micro Text Report   Final       SOURCE: LEFT ARM    COMMENT                   NO GROWTH AEROBICALLY/ANAEROBICALLY IN 5 DAYS   ANTIBIOTIC                                                      Culture, blood (single)     Status: None   Collection Time: 02/03/14  5:48 PM  Result Value Ref Range Status   Micro Text Report   Final       SOURCE: left arm    COMMENT                   NO GROWTH AEROBICALLY/ANAEROBICALLY IN 5 DAYS   ANTIBIOTIC  RADIOLOGY:  No results found.  EKG:   Orders placed or performed during the hospital encounter of 11/14/14  . ED EKG  . ED EKG  . EKG 12-Lead  . EKG 12-Lead      Management plans discussed with the patient, family and they are in agreement.  CODE STATUS: Full  TOTAL TIME TAKING CARE OF THIS PATIENT: 35 minutes.  Greater than 50% of time spent in care coordination and counseling.  Elby Showers M.D on 11/19/2014 at 4:21 PM  Between 7am to 6pm - Pager - [phone]  After 6pm go to [email or web address] - password [email or web address]  [facility] [place] [facility]  Office  [phone]  CC: Primary care physician; Lorie Phenix, MD

## 2014-11-20 DIAGNOSIS — Z9981 Dependence on supplemental oxygen: Secondary | ICD-10-CM | POA: Diagnosis not present

## 2014-11-20 DIAGNOSIS — Z9181 History of falling: Secondary | ICD-10-CM | POA: Diagnosis not present

## 2014-11-20 DIAGNOSIS — J45909 Unspecified asthma, uncomplicated: Secondary | ICD-10-CM | POA: Diagnosis not present

## 2014-11-20 DIAGNOSIS — J449 Chronic obstructive pulmonary disease, unspecified: Secondary | ICD-10-CM | POA: Diagnosis not present

## 2014-11-20 DIAGNOSIS — I2510 Atherosclerotic heart disease of native coronary artery without angina pectoris: Secondary | ICD-10-CM | POA: Diagnosis not present

## 2014-11-21 ENCOUNTER — Ambulatory Visit (INDEPENDENT_AMBULATORY_CARE_PROVIDER_SITE_OTHER): Payer: Medicare Other | Admitting: Family Medicine

## 2014-11-21 ENCOUNTER — Encounter: Payer: Self-pay | Admitting: Family Medicine

## 2014-11-21 DIAGNOSIS — J449 Chronic obstructive pulmonary disease, unspecified: Secondary | ICD-10-CM

## 2014-11-21 DIAGNOSIS — I2510 Atherosclerotic heart disease of native coronary artery without angina pectoris: Secondary | ICD-10-CM | POA: Diagnosis not present

## 2014-11-21 DIAGNOSIS — Z9981 Dependence on supplemental oxygen: Secondary | ICD-10-CM | POA: Diagnosis not present

## 2014-11-21 DIAGNOSIS — D692 Other nonthrombocytopenic purpura: Secondary | ICD-10-CM | POA: Diagnosis not present

## 2014-11-21 NOTE — Progress Notes (Signed)
Patient ID: Samantha Goodman, female   DOB: 1935/03/09, 79 y.o.   MRN: [id number]        Patient: Samantha Goodman Female    DOB: 27-Nov-1935   79 y.o.   MRN: [id number] Visit Date: 11/21/2014  Today's Provider: Lorie Phenix, MD   Chief Complaint  Patient presents with  . COPD     Subjective:      Follow up Hospitalization  Patient was admitted to [facility] on 11/14/2014 and discharged on 11/17/2014. She was treated for COPD Exacerbation. Treatment for this included Z-pak and prednisone taper. She reports excellent compliance with treatment. She reports this condition is Improved.  Back at baseline. Does not want Acetylcysteine. Makes her sick.    ------------------------------------------------------------------------------------  Also with bruising on arms and legs.   Worse since hospitalization.   Mood is good. Doing well on Celexa   Breathing Problem She complains of cough, difficulty breathing, sputum production and wheezing. There is no shortness of breath. This is a chronic problem. The problem has been gradually improving. The cough is productive of sputum. Pertinent negatives include no ear pain, headaches, postnasal drip, rhinorrhea, sneezing, sore throat or trouble swallowing. Her past medical history is significant for COPD.       Allergies  Allergen Reactions  . Codeine Nausea And Vomiting  . Fluticasone-Salmeterol Other (See Comments)    Reaction:  Unknown   . Levofloxacin Other (See Comments)    Reaction:  Confusion and hallucinations   . Tiotropium Bromide Monohydrate Rash   Previous Medications   ACETAMINOPHEN (TYLENOL) 500 MG TABLET    Take 1,000 mg by mouth every 6 (six) hours as needed for mild pain.    ASPIRIN EC 81 MG TABLET    Take 81 mg by mouth daily.   ATORVASTATIN (LIPITOR) 40 MG TABLET    Take 40 mg by mouth at bedtime.   AZITHROMYCIN (ZITHROMAX) 250 MG TABLET    Take one tablet daily for 5 days and then stop.   BENZONATATE (TESSALON) 200 MG CAPSULE     Take 1 capsule (200 mg total) by mouth 2 (two) times daily as needed for cough.   BUDESONIDE-FORMOTEROL (SYMBICORT) 160-4.5 MCG/ACT INHALER    Inhale 2 puffs into the lungs 2 (two) times daily.   CHOLECALCIFEROL (VITAMIN D) 1000 UNITS TABLET    Take 1,000 Units by mouth daily.   CLOPIDOGREL (PLAVIX) 75 MG TABLET    Take 75 mg by mouth at bedtime.   FERROUS SULFATE 325 (65 FE) MG TABLET    Take 325 mg by mouth at bedtime.   FLUTICASONE (FLONASE) 50 MCG/ACT NASAL SPRAY    Place 2 sprays into both nostrils daily as needed for rhinitis.   FUROSEMIDE (LASIX) 20 MG TABLET    Take 1 tablet (20 mg total) by mouth daily.   GUAIFENESIN (MUCINEX) 600 MG 12 HR TABLET    Take 600 mg by mouth daily.   GUAIFENESIN (MUCINEX) 600 MG 12 HR TABLET    Take 1 tablet (600 mg total) by mouth 2 (two) times daily.   IPRATROPIUM-ALBUTEROL (DUONEB) 0.5-2.5 (3) MG/3ML SOLN    Take 3 mLs by nebulization 4 (four) times daily as needed (for shortness of breath/wheezing).   LEVOTHYROXINE (SYNTHROID, LEVOTHROID) 88 MCG TABLET    Take 88 mcg by mouth daily.   LORATADINE (CLARITIN) 10 MG TABLET    Take 10 mg by mouth daily.   METOPROLOL SUCCINATE (TOPROL-XL) 25 MG 24 HR TABLET    Take 12.5 mg  by mouth daily.   MONTELUKAST (SINGULAIR) 10 MG TABLET    Take 1 tablet (10 mg total) by mouth daily.   ONDANSETRON (ZOFRAN) 4 MG TABLET    Take 1 tablet (4 mg total) by mouth every 8 (eight) hours as needed for nausea or vomiting.   PANTOPRAZOLE (PROTONIX) 40 MG TABLET    Take 40 mg by mouth daily.   POTASSIUM 99 MG TABS    Take 99 mg by mouth daily.    PREDNISONE (DELTASONE) 50 MG TABLET    Take one tablet daily for 5 days and then stop.   PROAIR HFA 108 (90 BASE) MCG/ACT INHALER    Inhale 2 puffs into the lungs every 4 (four) hours as needed for wheezing or shortness of breath.   THEOPHYLLINE (THEO-24) 200 MG 24 HR CAPSULE    Take 200 mg by mouth daily.    Review of Systems  Constitutional: Negative.   HENT: Negative for congestion,  ear discharge, ear pain, facial swelling, hearing loss, mouth sores, nosebleeds, postnasal drip, rhinorrhea, sinus pressure, sneezing, sore throat, tinnitus, trouble swallowing and voice change.   Eyes: Negative.   Respiratory: Positive for cough, sputum production and wheezing. Negative for apnea, choking, chest tightness, shortness of breath and stridor.   Cardiovascular: Negative.   Gastrointestinal: Negative.   Neurological: Negative for dizziness, light-headedness and headaches.    Social History  Substance Use Topics  . Smoking status: Former Smoker -- 0.25 packs/day for 65 years    Quit date: 11/11/2013  . Smokeless tobacco: Never Used  . Alcohol Use: No   Objective:   BP 102/60 mmHg  Pulse 88  Temp(Src) 98.5 F (36.9 C) (Oral)  Resp 16  Wt 147 lb (66.679 kg)  Physical Exam  Constitutional: She is oriented to person, place, and time. She appears well-developed and well-nourished.  HENT:  Oxygen per [place].   Pulmonary/Chest: Effort normal.  Inspiratory and expiratory but with good air movement today.    Neurological: She is alert and oriented to person, place, and time.  Psychiatric: She has a normal mood and affect. Her behavior is normal. Judgment and thought content normal.      Assessment & Plan:    1. Arteriosclerosis of coronary artery Condition is stable. Please continue current medication and  plan of care as noted.    2. Chronic obstructive pulmonary disease, unspecified COPD type (HCC) Condition is stable. Please continue current medication and  plan of care as noted.    3. Oxygen dependent Continue oxygen.   4. Senile purpura (HCC) Stable.       Lorie PhenixNancy Teah [place]otaw, MD  [facility]

## 2014-11-22 DIAGNOSIS — I2510 Atherosclerotic heart disease of native coronary artery without angina pectoris: Secondary | ICD-10-CM | POA: Diagnosis not present

## 2014-11-22 DIAGNOSIS — Z9981 Dependence on supplemental oxygen: Secondary | ICD-10-CM | POA: Diagnosis not present

## 2014-11-22 DIAGNOSIS — J449 Chronic obstructive pulmonary disease, unspecified: Secondary | ICD-10-CM | POA: Diagnosis not present

## 2014-11-22 DIAGNOSIS — J45909 Unspecified asthma, uncomplicated: Secondary | ICD-10-CM | POA: Diagnosis not present

## 2014-11-22 DIAGNOSIS — Z9181 History of falling: Secondary | ICD-10-CM | POA: Diagnosis not present

## 2014-11-23 ENCOUNTER — Other Ambulatory Visit: Payer: Self-pay | Admitting: Family Medicine

## 2014-11-23 ENCOUNTER — Telehealth: Payer: Self-pay | Admitting: Family Medicine

## 2014-11-23 DIAGNOSIS — J449 Chronic obstructive pulmonary disease, unspecified: Secondary | ICD-10-CM

## 2014-11-23 NOTE — Telephone Encounter (Signed)
Pt contacted office for refill request on the following medications:  theophylline (THEO-24) 200 MG 24 hr capsule.  [facility] [address]  [phone]/MW

## 2014-11-24 DIAGNOSIS — J449 Chronic obstructive pulmonary disease, unspecified: Secondary | ICD-10-CM | POA: Diagnosis not present

## 2014-11-24 DIAGNOSIS — Z9981 Dependence on supplemental oxygen: Secondary | ICD-10-CM | POA: Diagnosis not present

## 2014-11-24 DIAGNOSIS — D692 Other nonthrombocytopenic purpura: Secondary | ICD-10-CM | POA: Diagnosis not present

## 2014-11-24 DIAGNOSIS — I509 Heart failure, unspecified: Secondary | ICD-10-CM | POA: Diagnosis not present

## 2014-11-27 DIAGNOSIS — J449 Chronic obstructive pulmonary disease, unspecified: Secondary | ICD-10-CM | POA: Diagnosis not present

## 2014-11-27 DIAGNOSIS — Z9181 History of falling: Secondary | ICD-10-CM | POA: Diagnosis not present

## 2014-11-27 DIAGNOSIS — I2510 Atherosclerotic heart disease of native coronary artery without angina pectoris: Secondary | ICD-10-CM | POA: Diagnosis not present

## 2014-11-27 DIAGNOSIS — Z9981 Dependence on supplemental oxygen: Secondary | ICD-10-CM | POA: Diagnosis not present

## 2014-11-27 DIAGNOSIS — J45909 Unspecified asthma, uncomplicated: Secondary | ICD-10-CM | POA: Diagnosis not present

## 2014-11-30 ENCOUNTER — Telehealth: Payer: Self-pay | Admitting: Family Medicine

## 2014-11-30 DIAGNOSIS — J209 Acute bronchitis, unspecified: Secondary | ICD-10-CM | POA: Diagnosis not present

## 2014-11-30 DIAGNOSIS — R0902 Hypoxemia: Secondary | ICD-10-CM | POA: Diagnosis not present

## 2014-11-30 DIAGNOSIS — J449 Chronic obstructive pulmonary disease, unspecified: Secondary | ICD-10-CM | POA: Diagnosis not present

## 2014-11-30 DIAGNOSIS — J9690 Respiratory failure, unspecified, unspecified whether with hypoxia or hypercapnia: Secondary | ICD-10-CM | POA: Diagnosis not present

## 2014-11-30 DIAGNOSIS — Z9181 History of falling: Secondary | ICD-10-CM | POA: Diagnosis not present

## 2014-11-30 DIAGNOSIS — I2510 Atherosclerotic heart disease of native coronary artery without angina pectoris: Secondary | ICD-10-CM | POA: Diagnosis not present

## 2014-11-30 DIAGNOSIS — Z9981 Dependence on supplemental oxygen: Secondary | ICD-10-CM | POA: Diagnosis not present

## 2014-11-30 DIAGNOSIS — J45909 Unspecified asthma, uncomplicated: Secondary | ICD-10-CM | POA: Diagnosis not present

## 2014-11-30 NOTE — Telephone Encounter (Signed)
Pt would like to switch her oxygen orders to [facility]. [facility]  [phone] per pt. Pt request a call back to keep her updated. Thanks TNP

## 2014-11-30 NOTE — Telephone Encounter (Signed)
Ok to change companies. Thanks.

## 2014-12-03 NOTE — Telephone Encounter (Signed)
Pt has called back wanting to know if this is going to be switched.  Her call back is [phone].  Thank sTeri

## 2014-12-04 DIAGNOSIS — Z9981 Dependence on supplemental oxygen: Secondary | ICD-10-CM | POA: Diagnosis not present

## 2014-12-04 DIAGNOSIS — J449 Chronic obstructive pulmonary disease, unspecified: Secondary | ICD-10-CM | POA: Diagnosis not present

## 2014-12-04 DIAGNOSIS — I2510 Atherosclerotic heart disease of native coronary artery without angina pectoris: Secondary | ICD-10-CM | POA: Diagnosis not present

## 2014-12-04 DIAGNOSIS — Z9181 History of falling: Secondary | ICD-10-CM | POA: Diagnosis not present

## 2014-12-04 DIAGNOSIS — J45909 Unspecified asthma, uncomplicated: Secondary | ICD-10-CM | POA: Diagnosis not present

## 2014-12-05 NOTE — Telephone Encounter (Signed)
[facility]

## 2014-12-07 DIAGNOSIS — J449 Chronic obstructive pulmonary disease, unspecified: Secondary | ICD-10-CM | POA: Diagnosis not present

## 2014-12-07 DIAGNOSIS — I2510 Atherosclerotic heart disease of native coronary artery without angina pectoris: Secondary | ICD-10-CM | POA: Diagnosis not present

## 2014-12-07 DIAGNOSIS — Z9981 Dependence on supplemental oxygen: Secondary | ICD-10-CM | POA: Diagnosis not present

## 2014-12-07 DIAGNOSIS — Z9181 History of falling: Secondary | ICD-10-CM | POA: Diagnosis not present

## 2014-12-07 DIAGNOSIS — J45909 Unspecified asthma, uncomplicated: Secondary | ICD-10-CM | POA: Diagnosis not present

## 2014-12-10 DIAGNOSIS — I2510 Atherosclerotic heart disease of native coronary artery without angina pectoris: Secondary | ICD-10-CM | POA: Diagnosis not present

## 2014-12-10 DIAGNOSIS — Z9181 History of falling: Secondary | ICD-10-CM | POA: Diagnosis not present

## 2014-12-10 DIAGNOSIS — J449 Chronic obstructive pulmonary disease, unspecified: Secondary | ICD-10-CM | POA: Diagnosis not present

## 2014-12-10 DIAGNOSIS — J45909 Unspecified asthma, uncomplicated: Secondary | ICD-10-CM | POA: Diagnosis not present

## 2014-12-10 DIAGNOSIS — Z9981 Dependence on supplemental oxygen: Secondary | ICD-10-CM | POA: Diagnosis not present

## 2014-12-11 DIAGNOSIS — J9611 Chronic respiratory failure with hypoxia: Secondary | ICD-10-CM | POA: Diagnosis not present

## 2014-12-11 DIAGNOSIS — R0602 Shortness of breath: Secondary | ICD-10-CM | POA: Diagnosis not present

## 2014-12-11 DIAGNOSIS — J449 Chronic obstructive pulmonary disease, unspecified: Secondary | ICD-10-CM | POA: Diagnosis not present

## 2014-12-14 DIAGNOSIS — Z9981 Dependence on supplemental oxygen: Secondary | ICD-10-CM | POA: Diagnosis not present

## 2014-12-14 DIAGNOSIS — Z9181 History of falling: Secondary | ICD-10-CM | POA: Diagnosis not present

## 2014-12-14 DIAGNOSIS — J449 Chronic obstructive pulmonary disease, unspecified: Secondary | ICD-10-CM | POA: Diagnosis not present

## 2014-12-14 DIAGNOSIS — I2510 Atherosclerotic heart disease of native coronary artery without angina pectoris: Secondary | ICD-10-CM | POA: Diagnosis not present

## 2014-12-14 DIAGNOSIS — J45909 Unspecified asthma, uncomplicated: Secondary | ICD-10-CM | POA: Diagnosis not present

## 2014-12-18 ENCOUNTER — Telehealth: Payer: Self-pay | Admitting: Family Medicine

## 2014-12-18 DIAGNOSIS — Z9981 Dependence on supplemental oxygen: Secondary | ICD-10-CM | POA: Diagnosis not present

## 2014-12-18 DIAGNOSIS — J449 Chronic obstructive pulmonary disease, unspecified: Secondary | ICD-10-CM | POA: Diagnosis not present

## 2014-12-18 DIAGNOSIS — J45909 Unspecified asthma, uncomplicated: Secondary | ICD-10-CM | POA: Diagnosis not present

## 2014-12-18 DIAGNOSIS — Z9181 History of falling: Secondary | ICD-10-CM | POA: Diagnosis not present

## 2014-12-18 DIAGNOSIS — I2510 Atherosclerotic heart disease of native coronary artery without angina pectoris: Secondary | ICD-10-CM | POA: Diagnosis not present

## 2014-12-18 NOTE — Telephone Encounter (Signed)
[facility] called b/c pt has been selected to take part in a program they offer for pt with congestive heart failure and they need to confirm that pt does have CHF and they spoke with pt but pt didn't know the name of her cardio doctor and they wanted to know if we had that on file and if we had any consult info. Samantha Sheldonshley also wanted to know if pt does have CHF if we have an EF% on file. Please advise. Thanks TNP

## 2014-12-19 NOTE — Telephone Encounter (Signed)
May have right sided heart failure, but did not find documentation of it in cardiologist note. Per note from 2015, she has an normal EF. Thanks.

## 2014-12-24 DIAGNOSIS — J449 Chronic obstructive pulmonary disease, unspecified: Secondary | ICD-10-CM | POA: Diagnosis not present

## 2014-12-24 DIAGNOSIS — J45909 Unspecified asthma, uncomplicated: Secondary | ICD-10-CM | POA: Diagnosis not present

## 2014-12-24 DIAGNOSIS — Z9981 Dependence on supplemental oxygen: Secondary | ICD-10-CM | POA: Diagnosis not present

## 2014-12-24 DIAGNOSIS — I2510 Atherosclerotic heart disease of native coronary artery without angina pectoris: Secondary | ICD-10-CM | POA: Diagnosis not present

## 2014-12-24 DIAGNOSIS — Z9181 History of falling: Secondary | ICD-10-CM | POA: Diagnosis not present

## 2014-12-30 DIAGNOSIS — R0902 Hypoxemia: Secondary | ICD-10-CM | POA: Diagnosis not present

## 2014-12-30 DIAGNOSIS — J9690 Respiratory failure, unspecified, unspecified whether with hypoxia or hypercapnia: Secondary | ICD-10-CM | POA: Diagnosis not present

## 2014-12-30 DIAGNOSIS — J209 Acute bronchitis, unspecified: Secondary | ICD-10-CM | POA: Diagnosis not present

## 2014-12-31 DIAGNOSIS — I2510 Atherosclerotic heart disease of native coronary artery without angina pectoris: Secondary | ICD-10-CM | POA: Diagnosis not present

## 2014-12-31 DIAGNOSIS — Z9981 Dependence on supplemental oxygen: Secondary | ICD-10-CM | POA: Diagnosis not present

## 2014-12-31 DIAGNOSIS — J449 Chronic obstructive pulmonary disease, unspecified: Secondary | ICD-10-CM | POA: Diagnosis not present

## 2014-12-31 DIAGNOSIS — Z9181 History of falling: Secondary | ICD-10-CM | POA: Diagnosis not present

## 2014-12-31 DIAGNOSIS — J45909 Unspecified asthma, uncomplicated: Secondary | ICD-10-CM | POA: Diagnosis not present

## 2015-01-02 ENCOUNTER — Other Ambulatory Visit: Payer: Self-pay | Admitting: Family Medicine

## 2015-01-02 DIAGNOSIS — K5909 Other constipation: Secondary | ICD-10-CM

## 2015-01-10 DIAGNOSIS — J449 Chronic obstructive pulmonary disease, unspecified: Secondary | ICD-10-CM | POA: Diagnosis not present

## 2015-01-18 ENCOUNTER — Telehealth: Payer: Self-pay | Admitting: Family Medicine

## 2015-01-18 NOTE — Telephone Encounter (Signed)
Samantha Goodman stated that she would like to speak to a nurse b/c pt is enrolled in a congestive heart program [facility]. Thanks TNP

## 2015-01-18 NOTE — Telephone Encounter (Signed)
LMTCB 01/18/2015  Thanks,   -Laura  

## 2015-01-22 NOTE — Telephone Encounter (Signed)
Left message to call back  

## 2015-01-23 ENCOUNTER — Ambulatory Visit: Payer: Medicare Other | Admitting: Physician Assistant

## 2015-01-23 ENCOUNTER — Encounter: Payer: Self-pay | Admitting: Family Medicine

## 2015-01-23 ENCOUNTER — Ambulatory Visit (INDEPENDENT_AMBULATORY_CARE_PROVIDER_SITE_OTHER): Payer: Medicare Other | Admitting: Family Medicine

## 2015-01-23 ENCOUNTER — Other Ambulatory Visit: Payer: Self-pay | Admitting: Family Medicine

## 2015-01-23 VITALS — BP 120/64 | HR 104 | Temp 97.9°F | Resp 24 | Wt 146.0 lb

## 2015-01-23 DIAGNOSIS — J441 Chronic obstructive pulmonary disease with (acute) exacerbation: Secondary | ICD-10-CM

## 2015-01-23 MED ORDER — PREDNISONE 10 MG PO TABS: ORAL_TABLET | ORAL | Status: DC

## 2015-01-23 MED ORDER — AZITHROMYCIN 250 MG PO TABS: ORAL_TABLET | ORAL | Status: DC

## 2015-01-23 NOTE — Progress Notes (Signed)
Subjective:    Patient ID: Samantha Goodman, female    DOB: 05-Oct-1935, 80 y.o.   MRN: [id number]  Cough This is a recurrent problem. The current episode started yesterday. The problem has been rapidly worsening. The cough is productive of sputum (white). Associated symptoms include nasal congestion, shortness of breath and wheezing. Pertinent negatives include no chest pain, chills, ear congestion, ear pain, fever, headaches, heartburn, hemoptysis, postnasal drip, rhinorrhea, sore throat or sweats. Treatments tried: Symbicort, ProAir, Mucinex. The treatment provided moderate relief. Her past medical history is significant for COPD and environmental allergies.      Review of Systems  Constitutional: Negative for fever and chills.  HENT: Negative for ear pain, postnasal drip, rhinorrhea and sore throat.   Respiratory: Positive for cough, shortness of breath and wheezing. Negative for hemoptysis.   Cardiovascular: Negative for chest pain.  Gastrointestinal: Negative for heartburn.  Allergic/Immunologic: Positive for environmental allergies.  Neurological: Negative for headaches.   BP 120/64 mmHg  Pulse 104  Temp(Src) 97.9 F (36.6 C) (Oral)  Resp 24  Wt 146 lb (66.225 kg)  SpO2 96%   Patient Active Problem List   Diagnosis Date Noted  . COPD (chronic obstructive pulmonary disease) (HCC) 11/21/2014  . Senile purpura (HCC) 11/21/2014  . Oxygen dependent 07/13/2014  . Acid reflux 07/09/2014  . Allergic rhinitis 07/06/2014  . Anxiety 07/06/2014  . Body mass index (BMI) of 23.0-23.9 in adult 07/06/2014  . CCF (congestive cardiac failure) (HCC) 07/06/2014  . Chronic constipation 07/06/2014  . Dizziness 07/06/2014  . Edema extremities 07/06/2014  . Bleeding gastrointestinal 07/06/2014  . Hypercholesteremia 07/06/2014  . Decreased potassium in the blood 07/06/2014  . Adult hypothyroidism 07/06/2014  . OP (osteoporosis) 07/06/2014  . Procidentia of rectum 07/06/2014  . Compulsive  tobacco user syndrome 07/06/2014  . Anemia 07/04/2014  . Arteriosclerosis of coronary artery 03/09/2013  . Unstable angina pectoris (HCC) 03/09/2013   Past Medical History  Diagnosis Date  . COPD (chronic obstructive pulmonary disease) (HCC)   . Asthma   . CAD (coronary artery disease)   . Unstable angina pectoris (HCC)   . Hyperlipemia   . Agitation   . Fall   . Hypothyroid    Current Outpatient Prescriptions on File Prior to Visit  Medication Sig  . acetaminophen (TYLENOL) 500 MG tablet Take 1,000 mg by mouth every 6 (six) hours as needed for mild pain.   Marland Kitchen aspirin EC 81 MG tablet Take 81 mg by mouth daily.  Marland Kitchen atorvastatin (LIPITOR) 40 MG tablet Take 40 mg by mouth at bedtime.  . budesonide-formoterol (SYMBICORT) 160-4.5 MCG/ACT inhaler Inhale 2 puffs into the lungs 2 (two) times daily.  . cholecalciferol (VITAMIN D) 1000 UNITS tablet Take 1,000 Units by mouth daily.  . clopidogrel (PLAVIX) 75 MG tablet Take 75 mg by mouth at bedtime.  . ferrous sulfate 325 (65 FE) MG tablet take 1 tablet by mouth every evening  . fluticasone (FLONASE) 50 MCG/ACT nasal spray Place 2 sprays into both nostrils daily as needed for rhinitis.  . furosemide (LASIX) 20 MG tablet Take 1 tablet (20 mg total) by mouth daily.  Marland Kitchen guaiFENesin (MUCINEX) 600 MG 12 hr tablet Take 1 tablet (600 mg total) by mouth 2 (two) times daily.  Marland Kitchen ipratropium-albuterol (DUONEB) 0.5-2.5 (3) MG/3ML SOLN Take 3 mLs by nebulization 4 (four) times daily as needed (for shortness of breath/wheezing).  Marland Kitchen levothyroxine (SYNTHROID, LEVOTHROID) 88 MCG tablet Take 88 mcg by mouth daily.  Marland Kitchen loratadine (CLARITIN) 10  MG tablet Take 10 mg by mouth daily.  . metoprolol succinate (TOPROL-XL) 25 MG 24 hr tablet Take 12.5 mg by mouth daily.  . montelukast (SINGULAIR) 10 MG tablet Take 1 tablet (10 mg total) by mouth daily.  . pantoprazole (PROTONIX) 40 MG tablet Take 40 mg by mouth daily.  . polyethylene glycol (MIRALAX / GLYCOLAX) packet TAKE  1 PACKET BY MOUTH DAILY AS DIRECTED  . Potassium 99 MG TABS Take 99 mg by mouth daily.   Marland Kitchen PROAIR HFA 108 (90 BASE) MCG/ACT inhaler Inhale 2 puffs into the lungs every 4 (four) hours as needed for wheezing or shortness of breath.  . THEO-24 200 MG 24 hr capsule take 1 capsule by mouth once daily   No current facility-administered medications on file prior to visit.   Allergies  Allergen Reactions  . Codeine Nausea And Vomiting  . Fluticasone-Salmeterol Other (See Comments)    Reaction:  Unknown   . Levofloxacin Other (See Comments)    Reaction:  Confusion and hallucinations   . Tiotropium Bromide Monohydrate Rash   Past Surgical History  Procedure Laterality Date  . Eye surgery    . Abdominal hysterectomy    . Appendectomy    . Ankle surgery Right    Social History   Social History  . Marital Status: Widowed    Spouse Name: N/A  . Number of Children: N/A  . Years of Education: N/A   Occupational History  . Not on file.   Social History Main Topics  . Smoking status: Former Smoker -- 0.25 packs/day for 65 years    Quit date: 11/11/2013  . Smokeless tobacco: Never Used  . Alcohol Use: No  . Drug Use: No  . Sexual Activity: Not on file   Other Topics Concern  . Not on file   Social History Narrative   Family History  Problem Relation Age of Onset  . Stroke Mother   . Prostate cancer Father   . CAD Brother   . Ovarian cancer Sister   . Breast cancer Sister   . Lung cancer Sister        Objective:   Physical Exam  Constitutional: She appears well-developed and well-nourished.  HENT:  Right Ear: External ear normal.  Left Ear: External ear normal.  Mouth/Throat: Oropharynx is clear and moist.  Cardiovascular: Normal rate and regular rhythm.   Pulmonary/Chest: Effort normal. No respiratory distress. She has wheezes.  Good air movement.   Psychiatric: She has a normal mood and affect. Her behavior is normal.   BP 120/64 mmHg  Pulse 104  Temp(Src) 97.9  F (36.6 C) (Oral)  Resp 24  Wt 146 lb (66.225 kg)  SpO2 96%       Assessment & Plan:  1. COPD exacerbation (HCC) New.  Condition is worsening. Will start medication for better control.  Patient instructed to call back if condition worsens or does not improve.    - predniSONE (DELTASONE) 10 MG tablet; 6 po for 2 days and then 5 po for 2 days and then 4 po for 2 days and 3 po for 2 days and then 2 po for 2 days and then 1 po for 2 days.  Dispense: 42 tablet; Refill: 0 - azithromycin (ZITHROMAX) 250 MG tablet; 2 today and then one a day for 4 more days.  Dispense: 6 tablet; Refill: 0   Patient was seen and examined by Leo Grosser, MD, and note scribed by Allene Dillon, CMA. I have reviewed  the document for accuracy and completeness and I agree with above. Leo Grosser- Jaydeen Odor J. Shawnay Bramel, MD   Lorie PhenixNancy Cirilo Canner, MD

## 2015-01-25 NOTE — Telephone Encounter (Signed)
Patient came in for OV on 01/23/15 to discuss.

## 2015-01-30 DIAGNOSIS — J209 Acute bronchitis, unspecified: Secondary | ICD-10-CM | POA: Diagnosis not present

## 2015-01-30 DIAGNOSIS — R0902 Hypoxemia: Secondary | ICD-10-CM | POA: Diagnosis not present

## 2015-01-30 DIAGNOSIS — J9690 Respiratory failure, unspecified, unspecified whether with hypoxia or hypercapnia: Secondary | ICD-10-CM | POA: Diagnosis not present

## 2015-02-06 ENCOUNTER — Other Ambulatory Visit: Payer: Self-pay | Admitting: Family Medicine

## 2015-02-06 DIAGNOSIS — E039 Hypothyroidism, unspecified: Secondary | ICD-10-CM

## 2015-02-10 DIAGNOSIS — J449 Chronic obstructive pulmonary disease, unspecified: Secondary | ICD-10-CM | POA: Diagnosis not present

## 2015-02-19 ENCOUNTER — Encounter: Payer: Self-pay | Admitting: Family Medicine

## 2015-02-19 ENCOUNTER — Ambulatory Visit (INDEPENDENT_AMBULATORY_CARE_PROVIDER_SITE_OTHER): Payer: Medicare Other | Admitting: Family Medicine

## 2015-02-19 VITALS — BP 122/62 | HR 95 | Temp 97.9°F | Resp 20 | Wt 147.0 lb

## 2015-02-19 DIAGNOSIS — Z9981 Dependence on supplemental oxygen: Secondary | ICD-10-CM

## 2015-02-19 DIAGNOSIS — D692 Other nonthrombocytopenic purpura: Secondary | ICD-10-CM

## 2015-02-19 DIAGNOSIS — J449 Chronic obstructive pulmonary disease, unspecified: Secondary | ICD-10-CM

## 2015-02-19 MED ORDER — PREDNISONE 10 MG PO TABS: ORAL_TABLET | ORAL | Status: DC

## 2015-02-19 NOTE — Progress Notes (Signed)
   Subjective:    Patient ID: Samantha Goodman, female    DOB: [birth year], 80 y.o.   MRN: [id number]  HPI  COPD, Follow up:  She was last seen for this 4 weeks ago. Changes made include started on prednisone and azithromycin. Has not been hospitalized in months. Really feels well.  She reports excellent compliance with treatment. She is not having side effects.  she  Is using rescue inhaler. Typically 2-3 times per days. She IS experiencing wheezing. She is NOT experiencing fever. she report breathing is Improved. Is compliant with her oxygen. Wearing it today.    ------------------------------------------------------------------------      Review of Systems  Constitutional: Negative.   Respiratory: Positive for wheezing.   Cardiovascular: Negative.   Neurological: Negative.   Psychiatric/Behavioral: Negative.    Blood pressure 122/62, pulse 95, temperature 97.9 F (36.6 C), temperature source Oral, resp. rate 20, weight 147 lb (66.679 kg), SpO2 95 %.     Objective:   Physical Exam  Constitutional: She is oriented to person, place, and time. She appears well-developed and well-nourished.  HENT:  Head: Normocephalic and atraumatic.  Right Ear: External ear normal.  Left Ear: External ear normal.  Mouth/Throat: Oropharynx is clear and moist.  Oxygen by [place].   Eyes: Conjunctivae and EOM are normal. Pupils are equal, round, and reactive to light.  Neck: Normal range of motion. Neck supple.  Cardiovascular: Normal rate and regular rhythm.   Pulmonary/Chest: Effort normal. No respiratory distress. She has no wheezes. She has rales.  Neurological: She is alert and oriented to person, place, and time.  Psychiatric: She has a normal mood and affect. Her behavior is normal. Judgment and thought content normal.      Assessment & Plan:  1. Chronic obstructive pulmonary disease, unspecified COPD type (HCC) Stable. Will send in new prednisone rx  Use if needed and then follow up.  Patient understands when and how to use prednisone. Continue other medication as previous.  - predniSONE (DELTASONE) 10 MG tablet; 6 po for 2 days and then 5 po for 2 days and then 4 po for 2 days and 3 po for 2 days and then 2 po for 2 days and then 1 po for 2 days.  Dispense: 42 tablet; Refill: 0  2. Oxygen dependent Wearing oxygen daily.  3. Senile purpura (HCC) Stable. (Grandson told her to stop taking her medication as her lungs are so bad.    Lorie Phenix, MD

## 2015-03-02 DIAGNOSIS — J209 Acute bronchitis, unspecified: Secondary | ICD-10-CM | POA: Diagnosis not present

## 2015-03-02 DIAGNOSIS — J9690 Respiratory failure, unspecified, unspecified whether with hypoxia or hypercapnia: Secondary | ICD-10-CM | POA: Diagnosis not present

## 2015-03-02 DIAGNOSIS — R0902 Hypoxemia: Secondary | ICD-10-CM | POA: Diagnosis not present

## 2015-03-04 ENCOUNTER — Inpatient Hospital Stay
Admission: EM | Admit: 2015-03-04 | Discharge: 2015-03-06 | DRG: 189 | Disposition: A | Discharge status: Home / Self Care | Payer: Medicare Other | Attending: Internal Medicine | Admitting: Internal Medicine

## 2015-03-04 ENCOUNTER — Encounter: Payer: Self-pay | Admitting: *Deleted

## 2015-03-04 ENCOUNTER — Ambulatory Visit: Payer: Medicare Other | Admitting: Family Medicine

## 2015-03-04 ENCOUNTER — Emergency Department: Payer: Medicare Other

## 2015-03-04 ENCOUNTER — Telehealth: Payer: Self-pay

## 2015-03-04 DIAGNOSIS — Z8249 Family history of ischemic heart disease and other diseases of the circulatory system: Secondary | ICD-10-CM | POA: Diagnosis not present

## 2015-03-04 DIAGNOSIS — J45909 Unspecified asthma, uncomplicated: Secondary | ICD-10-CM | POA: Diagnosis present

## 2015-03-04 DIAGNOSIS — J449 Chronic obstructive pulmonary disease, unspecified: Secondary | ICD-10-CM | POA: Diagnosis not present

## 2015-03-04 DIAGNOSIS — Z7982 Long term (current) use of aspirin: Secondary | ICD-10-CM

## 2015-03-04 DIAGNOSIS — Z8041 Family history of malignant neoplasm of ovary: Secondary | ICD-10-CM | POA: Diagnosis not present

## 2015-03-04 DIAGNOSIS — R0602 Shortness of breath: Secondary | ICD-10-CM

## 2015-03-04 DIAGNOSIS — Z9049 Acquired absence of other specified parts of digestive tract: Secondary | ICD-10-CM | POA: Diagnosis not present

## 2015-03-04 DIAGNOSIS — E039 Hypothyroidism, unspecified: Secondary | ICD-10-CM | POA: Diagnosis not present

## 2015-03-04 DIAGNOSIS — J410 Simple chronic bronchitis: Secondary | ICD-10-CM

## 2015-03-04 DIAGNOSIS — J189 Pneumonia, unspecified organism: Secondary | ICD-10-CM | POA: Diagnosis not present

## 2015-03-04 DIAGNOSIS — Z823 Family history of stroke: Secondary | ICD-10-CM | POA: Diagnosis not present

## 2015-03-04 DIAGNOSIS — I10 Essential (primary) hypertension: Secondary | ICD-10-CM | POA: Diagnosis present

## 2015-03-04 DIAGNOSIS — Z90710 Acquired absence of both cervix and uterus: Secondary | ICD-10-CM | POA: Diagnosis not present

## 2015-03-04 DIAGNOSIS — Z803 Family history of malignant neoplasm of breast: Secondary | ICD-10-CM

## 2015-03-04 DIAGNOSIS — Z66 Do not resuscitate: Secondary | ICD-10-CM | POA: Diagnosis present

## 2015-03-04 DIAGNOSIS — J9620 Acute and chronic respiratory failure, unspecified whether with hypoxia or hypercapnia: Secondary | ICD-10-CM | POA: Diagnosis not present

## 2015-03-04 DIAGNOSIS — Z9889 Other specified postprocedural states: Secondary | ICD-10-CM | POA: Diagnosis not present

## 2015-03-04 DIAGNOSIS — I2510 Atherosclerotic heart disease of native coronary artery without angina pectoris: Secondary | ICD-10-CM | POA: Diagnosis present

## 2015-03-04 DIAGNOSIS — Z87891 Personal history of nicotine dependence: Secondary | ICD-10-CM | POA: Diagnosis not present

## 2015-03-04 DIAGNOSIS — E876 Hypokalemia: Secondary | ICD-10-CM | POA: Diagnosis present

## 2015-03-04 DIAGNOSIS — J90 Pleural effusion, not elsewhere classified: Secondary | ICD-10-CM | POA: Diagnosis not present

## 2015-03-04 DIAGNOSIS — Z79899 Other long term (current) drug therapy: Secondary | ICD-10-CM

## 2015-03-04 DIAGNOSIS — Z801 Family history of malignant neoplasm of trachea, bronchus and lung: Secondary | ICD-10-CM

## 2015-03-04 DIAGNOSIS — Z7951 Long term (current) use of inhaled steroids: Secondary | ICD-10-CM | POA: Diagnosis not present

## 2015-03-04 DIAGNOSIS — J9621 Acute and chronic respiratory failure with hypoxia: Principal | ICD-10-CM | POA: Diagnosis present

## 2015-03-04 DIAGNOSIS — J441 Chronic obstructive pulmonary disease with (acute) exacerbation: Secondary | ICD-10-CM | POA: Diagnosis present

## 2015-03-04 DIAGNOSIS — Z888 Allergy status to other drugs, medicaments and biological substances status: Secondary | ICD-10-CM

## 2015-03-04 DIAGNOSIS — Z886 Allergy status to analgesic agent status: Secondary | ICD-10-CM | POA: Diagnosis not present

## 2015-03-04 DIAGNOSIS — E785 Hyperlipidemia, unspecified: Secondary | ICD-10-CM | POA: Diagnosis not present

## 2015-03-04 DIAGNOSIS — Z9981 Dependence on supplemental oxygen: Secondary | ICD-10-CM

## 2015-03-04 LAB — CBC
HCT: 39.6 % (ref 35.0–47.0)
HEMOGLOBIN: 13 g/dL (ref 12.0–16.0)
MCH: 26.9 pg (ref 26.0–34.0)
MCHC: 32.8 g/dL (ref 32.0–36.0)
MCV: 82.2 fL (ref 80.0–100.0)
Platelets: 316 10*3/uL (ref 150–440)
RBC: 4.81 MIL/uL (ref 3.80–5.20)
RDW: 14.5 % (ref 11.5–14.5)
WBC: 20.4 10*3/uL — ABNORMAL HIGH (ref 3.6–11.0)

## 2015-03-04 LAB — BASIC METABOLIC PANEL
ANION GAP: 10 (ref 5–15)
BUN: 17 mg/dL (ref 6–20)
CALCIUM: 9 mg/dL (ref 8.9–10.3)
CHLORIDE: 100 mmol/L — AB (ref 101–111)
CO2: 32 mmol/L (ref 22–32)
Creatinine, Ser: 0.77 mg/dL (ref 0.44–1.00)
GFR calc non Af Amer: >60 mL/min (ref >60)
GLUCOSE: 88 mg/dL (ref 65–99)
Potassium: 3 mmol/L — ABNORMAL LOW (ref 3.5–5.1)
Sodium: 142 mmol/L (ref 135–145)

## 2015-03-04 LAB — BLOOD GAS, ARTERIAL
ALLENS TEST (PASS/FAIL): POSITIVE — AB
Acid-Base Excess: 9.2 mmol/L — ABNORMAL HIGH (ref 0.0–3.0)
Bicarbonate: 33.5 mEq/L — ABNORMAL HIGH (ref 21.0–28.0)
FIO2: 0.36
O2 Saturation: 96.1 %
PCO2 ART: 43 mmHg (ref 32.0–48.0)
PH ART: 7.5 — AB (ref 7.350–7.450)
PO2 ART: 75 mmHg — AB (ref 83.0–108.0)
Patient temperature: 37

## 2015-03-04 LAB — RAPID INFLUENZA A&B ANTIGENS (ARMC ONLY): INFLUENZA B (ARMC): NOT DETECTED

## 2015-03-04 LAB — RAPID INFLUENZA A&B ANTIGENS: Influenza A (ARMC): NOT DETECTED

## 2015-03-04 LAB — TROPONIN I: Troponin I: 0.03 ng/mL (ref <0.031)

## 2015-03-04 MED ORDER — ACETAMINOPHEN 325 MG PO TABS
650.0000 mg | ORAL_TABLET | Freq: Four times a day (QID) | ORAL | Status: DC | PRN
  Filled 2015-03-04: qty 2

## 2015-03-04 MED ORDER — FERROUS SULFATE 325 (65 FE) MG PO TABS
325.0000 mg | ORAL_TABLET | Freq: Every evening | ORAL | Status: DC
  Administered 2015-03-04 – 2015-03-05 (×2): 325 mg via ORAL
  Filled 2015-03-04 (×3): qty 1

## 2015-03-04 MED ORDER — MONTELUKAST SODIUM 10 MG PO TABS
10.0000 mg | ORAL_TABLET | Freq: Every day | ORAL | Status: DC
  Administered 2015-03-05 – 2015-03-06 (×2): 10 mg via ORAL
  Filled 2015-03-04 (×2): qty 1

## 2015-03-04 MED ORDER — HYDROCODONE-ACETAMINOPHEN 5-325 MG PO TABS
1.0000 | ORAL_TABLET | ORAL | Status: DC | PRN
  Administered 2015-03-04 (×2): 1 via ORAL
  Filled 2015-03-04 (×2): qty 1

## 2015-03-04 MED ORDER — IPRATROPIUM-ALBUTEROL 0.5-2.5 (3) MG/3ML IN SOLN
3.0000 mL | Freq: Four times a day (QID) | RESPIRATORY_TRACT | Status: DC | PRN
  Administered 2015-03-04 – 2015-03-06 (×6): 3 mL via RESPIRATORY_TRACT
  Filled 2015-03-04 (×6): qty 3

## 2015-03-04 MED ORDER — BUDESONIDE-FORMOTEROL FUMARATE 160-4.5 MCG/ACT IN AERO
2.0000 | INHALATION_SPRAY | Freq: Two times a day (BID) | RESPIRATORY_TRACT | Status: DC
  Administered 2015-03-04 – 2015-03-06 (×4): 2 via RESPIRATORY_TRACT
  Filled 2015-03-04: qty 6

## 2015-03-04 MED ORDER — THEOPHYLLINE ER 200 MG PO CP24
200.0000 mg | ORAL_CAPSULE | Freq: Every day | ORAL | Status: DC
  Administered 2015-03-05 – 2015-03-06 (×2): 200 mg via ORAL
  Filled 2015-03-04 (×3): qty 1

## 2015-03-04 MED ORDER — IPRATROPIUM-ALBUTEROL 0.5-2.5 (3) MG/3ML IN SOLN
3.0000 mL | Freq: Once | RESPIRATORY_TRACT | Status: AC
  Administered 2015-03-04: 3 mL via RESPIRATORY_TRACT

## 2015-03-04 MED ORDER — ONDANSETRON HCL 4 MG/2ML IJ SOLN: 4.0000 mg | Freq: Four times a day (QID) | INTRAMUSCULAR | Status: DC | PRN

## 2015-03-04 MED ORDER — ENOXAPARIN SODIUM 40 MG/0.4ML ~~LOC~~ SOLN
40.0000 mg | SUBCUTANEOUS | Status: DC
  Administered 2015-03-04 – 2015-03-05 (×2): 40 mg via SUBCUTANEOUS
  Filled 2015-03-04 (×2): qty 0.4

## 2015-03-04 MED ORDER — CLOPIDOGREL BISULFATE 75 MG PO TABS
75.0000 mg | ORAL_TABLET | Freq: Every day | ORAL | Status: DC
  Administered 2015-03-04 – 2015-03-05 (×2): 75 mg via ORAL
  Filled 2015-03-04 (×2): qty 1

## 2015-03-04 MED ORDER — POTASSIUM CHLORIDE CRYS ER 20 MEQ PO TBCR
40.0000 meq | EXTENDED_RELEASE_TABLET | Freq: Once | ORAL | Status: AC
  Administered 2015-03-04: 40 meq via ORAL
  Filled 2015-03-04: qty 2

## 2015-03-04 MED ORDER — METHYLPREDNISOLONE SODIUM SUCC 125 MG IJ SOLR
125.0000 mg | Freq: Once | INTRAMUSCULAR | Status: AC
  Administered 2015-03-04: 125 mg via INTRAVENOUS
  Filled 2015-03-04: qty 2

## 2015-03-04 MED ORDER — LORAZEPAM 0.5 MG PO TABS: 0.5000 mg | ORAL_TABLET | Freq: Four times a day (QID) | ORAL | Status: DC | PRN

## 2015-03-04 MED ORDER — IPRATROPIUM-ALBUTEROL 0.5-2.5 (3) MG/3ML IN SOLN
RESPIRATORY_TRACT | Status: AC
  Administered 2015-03-04: 3 mL via RESPIRATORY_TRACT
  Filled 2015-03-04: qty 6

## 2015-03-04 MED ORDER — DEXTROSE 5 % IV SOLN
1.0000 g | Freq: Once | INTRAVENOUS | Status: AC
  Administered 2015-03-04: 1 g via INTRAVENOUS
  Filled 2015-03-04: qty 10

## 2015-03-04 MED ORDER — FLUTICASONE PROPIONATE 50 MCG/ACT NA SUSP
2.0000 | Freq: Every day | NASAL | Status: DC | PRN
  Filled 2015-03-04: qty 16

## 2015-03-04 MED ORDER — ASPIRIN EC 81 MG PO TBEC
81.0000 mg | DELAYED_RELEASE_TABLET | Freq: Every day | ORAL | Status: DC
  Administered 2015-03-05 – 2015-03-06 (×2): 81 mg via ORAL
  Filled 2015-03-04 (×2): qty 1

## 2015-03-04 MED ORDER — METOPROLOL SUCCINATE ER 25 MG PO TB24
12.5000 mg | ORAL_TABLET | Freq: Every day | ORAL | Status: DC
  Administered 2015-03-05 – 2015-03-06 (×2): 12.5 mg via ORAL
  Filled 2015-03-04 (×2): qty 1

## 2015-03-04 MED ORDER — ATORVASTATIN CALCIUM 20 MG PO TABS
40.0000 mg | ORAL_TABLET | Freq: Every day | ORAL | Status: DC
  Administered 2015-03-04 – 2015-03-05 (×2): 40 mg via ORAL
  Filled 2015-03-04 (×2): qty 2

## 2015-03-04 MED ORDER — METHYLPREDNISOLONE SODIUM SUCC 125 MG IJ SOLR
60.0000 mg | Freq: Three times a day (TID) | INTRAMUSCULAR | Status: DC
  Administered 2015-03-04 – 2015-03-06 (×6): 60 mg via INTRAVENOUS
  Filled 2015-03-04 (×6): qty 2

## 2015-03-04 MED ORDER — GUAIFENESIN-DM 100-10 MG/5ML PO SYRP
5.0000 mL | ORAL_SOLUTION | ORAL | Status: DC | PRN
  Administered 2015-03-05: 5 mL via ORAL
  Filled 2015-03-04: qty 5

## 2015-03-04 MED ORDER — DEXTROSE 5 % IV SOLN
500.0000 mg | Freq: Once | INTRAVENOUS | Status: AC
  Administered 2015-03-04: 500 mg via INTRAVENOUS
  Filled 2015-03-04: qty 500

## 2015-03-04 MED ORDER — POTASSIUM 99 MG PO TABS: 99.0000 mg | ORAL_TABLET | Freq: Every day | ORAL | Status: DC

## 2015-03-04 MED ORDER — ALUM & MAG HYDROXIDE-SIMETH 200-200-20 MG/5ML PO SUSP: 30.0000 mL | Freq: Four times a day (QID) | ORAL | Status: DC | PRN

## 2015-03-04 MED ORDER — ONDANSETRON HCL 4 MG PO TABS: 4.0000 mg | ORAL_TABLET | Freq: Four times a day (QID) | ORAL | Status: DC | PRN

## 2015-03-04 MED ORDER — VITAMIN D 1000 UNITS PO TABS
1000.0000 [IU] | ORAL_TABLET | Freq: Every day | ORAL | Status: DC
  Administered 2015-03-05 – 2015-03-06 (×2): 1000 [IU] via ORAL
  Filled 2015-03-04 (×2): qty 1

## 2015-03-04 MED ORDER — DEXTROSE 5 % IV SOLN
500.0000 mg | Freq: Every day | INTRAVENOUS | Status: DC
  Administered 2015-03-05: 500 mg via INTRAVENOUS
  Filled 2015-03-04 (×2): qty 500

## 2015-03-04 MED ORDER — DEXTROSE 5 % IV SOLN
1.0000 g | Freq: Every day | INTRAVENOUS | Status: DC
  Filled 2015-03-04: qty 10

## 2015-03-04 MED ORDER — LEVOTHYROXINE SODIUM 88 MCG PO TABS
88.0000 ug | ORAL_TABLET | Freq: Every day | ORAL | Status: DC
  Administered 2015-03-05 – 2015-03-06 (×2): 88 ug via ORAL
  Filled 2015-03-04 (×2): qty 1

## 2015-03-04 MED ORDER — FUROSEMIDE 20 MG PO TABS
20.0000 mg | ORAL_TABLET | Freq: Every day | ORAL | Status: DC
  Administered 2015-03-05 – 2015-03-06 (×2): 20 mg via ORAL
  Filled 2015-03-04 (×2): qty 1

## 2015-03-04 MED ORDER — PANTOPRAZOLE SODIUM 40 MG PO TBEC
40.0000 mg | DELAYED_RELEASE_TABLET | Freq: Every day | ORAL | Status: DC
  Administered 2015-03-05 – 2015-03-06 (×2): 40 mg via ORAL
  Filled 2015-03-04 (×2): qty 1

## 2015-03-04 MED ORDER — SENNOSIDES-DOCUSATE SODIUM 8.6-50 MG PO TABS: 1.0000 | ORAL_TABLET | Freq: Every evening | ORAL | Status: DC | PRN

## 2015-03-04 MED ORDER — ACETAMINOPHEN 500 MG PO TABS: 1000.0000 mg | ORAL_TABLET | Freq: Four times a day (QID) | ORAL | Status: DC | PRN

## 2015-03-04 MED ORDER — ACETAMINOPHEN 650 MG RE SUPP: 650.0000 mg | Freq: Four times a day (QID) | RECTAL | Status: DC | PRN

## 2015-03-04 NOTE — ED Provider Notes (Signed)
[facility] Emergency Department Provider Note  ____________________________________________  Time seen: 9: 30 a.m.  I have reviewed the triage vital signs and the nursing notes.   HISTORY  Chief Complaint Shortness of Breath     HPI Samantha Goodman is a 80 y.o. female with history of COPD CAD presents with increasing shortness of breath and cough 1 day O2 sat 90% on baseline 2 L nasal cannula. Patient denies any fever or chest pain. Patient is apparently increased work of breathing  Past Medical History  Diagnosis Date  . COPD (chronic obstructive pulmonary disease) (HCC)   . Asthma   . CAD (coronary artery disease)   . Unstable angina pectoris (HCC)   . Hyperlipemia   . Agitation   . Fall   . Hypothyroid     Patient Active Problem List   Diagnosis Date Noted  . COPD (chronic obstructive pulmonary disease) (HCC) 11/21/2014  . Senile purpura (HCC) 11/21/2014  . Oxygen dependent 07/13/2014  . Acid reflux 07/09/2014  . Allergic rhinitis 07/06/2014  . Anxiety 07/06/2014  . Body mass index (BMI) of 23.0-23.9 in adult 07/06/2014  . CCF (congestive cardiac failure) (HCC) 07/06/2014  . Chronic constipation 07/06/2014  . Dizziness 07/06/2014  . Edema extremities 07/06/2014  . Bleeding gastrointestinal 07/06/2014  . Hypercholesteremia 07/06/2014  . Decreased potassium in the blood 07/06/2014  . Adult hypothyroidism 07/06/2014  . OP (osteoporosis) 07/06/2014  . Procidentia of rectum 07/06/2014  . Tobacco abuse, in remission 07/06/2014  . Anemia 07/04/2014  . Arteriosclerosis of coronary artery 03/09/2013    Past Surgical History  Procedure Laterality Date  . Eye surgery    . Abdominal hysterectomy    . Appendectomy    . Ankle surgery Right     Current Outpatient Rx  Name  Route  Sig  Dispense  Refill  . acetaminophen (TYLENOL) 500 MG tablet   Oral   Take 1,000 mg by mouth every 6 (six) hours as needed for mild pain.          Marland Kitchen aspirin  EC 81 MG tablet   Oral   Take 81 mg by mouth daily.         Marland Kitchen atorvastatin (LIPITOR) 40 MG tablet   Oral   Take 40 mg by mouth at bedtime.         . budesonide-formoterol (SYMBICORT) 160-4.5 MCG/ACT inhaler   Inhalation   Inhale 2 puffs into the lungs 2 (two) times daily.         . cholecalciferol (VITAMIN D) 1000 UNITS tablet   Oral   Take 1,000 Units by mouth daily.         . clopidogrel (PLAVIX) 75 MG tablet   Oral   Take 75 mg by mouth at bedtime.         . ferrous sulfate 325 (65 FE) MG tablet      take 1 tablet by mouth every evening   30 tablet   1   . fluticasone (FLONASE) 50 MCG/ACT nasal spray   Each Nare   Place 2 sprays into both nostrils daily as needed for rhinitis.         . furosemide (LASIX) 20 MG tablet   Oral   Take 1 tablet (20 mg total) by mouth daily.   90 tablet   3   . guaiFENesin (MUCINEX) 600 MG 12 hr tablet   Oral   Take 1 tablet (600 mg total) by mouth 2 (two) times daily.  20 tablet   0   . ipratropium-albuterol (DUONEB) 0.5-2.5 (3) MG/3ML SOLN   Nebulization   Take 3 mLs by nebulization 4 (four) times daily as needed (for shortness of breath/wheezing).         Marland Kitchen levothyroxine (SYNTHROID, LEVOTHROID) 88 MCG tablet      take 1 tablet by mouth daily   90 tablet   1   . LORazepam (ATIVAN) 0.5 MG tablet   Oral   Take 0.5 mg by mouth every 6 (six) hours as needed for anxiety.         . metoprolol succinate (TOPROL-XL) 25 MG 24 hr tablet   Oral   Take 12.5 mg by mouth daily.         . montelukast (SINGULAIR) 10 MG tablet   Oral   Take 1 tablet (10 mg total) by mouth daily.   90 tablet   3   . pantoprazole (PROTONIX) 40 MG tablet   Oral   Take 40 mg by mouth daily.         . polyethylene glycol (MIRALAX / GLYCOLAX) packet      TAKE 1 PACKET BY MOUTH DAILY AS DIRECTED   90 packet   1   . Potassium 99 MG TABS   Oral   Take 99 mg by mouth daily.          . predniSONE (DELTASONE) 10 MG tablet       6 po for 2 days and then 5 po for 2 days and then 4 po for 2 days and 3 po for 2 days and then 2 po for 2 days and then 1 po for 2 days.   42 tablet   0   . PROAIR HFA 108 (90 BASE) MCG/ACT inhaler   Inhalation   Inhale 2 puffs into the lungs every 4 (four) hours as needed for wheezing or shortness of breath.   18 g   5     Dispense as written.   Marland Kitchen THEO-24 200 MG 24 hr capsule      take 1 capsule by mouth once daily   30 capsule   5     Allergies Codeine; Fluticasone-salmeterol; Levofloxacin; and Tiotropium bromide monohydrate  Family History  Problem Relation Age of Onset  . Stroke Mother   . Prostate cancer Father   . CAD Brother   . Ovarian cancer Sister   . Breast cancer Sister   . Lung cancer Sister     Social History Social History  Substance Use Topics  . Smoking status: Former Smoker -- 0.25 packs/day for 65 years    Quit date: 11/11/2013  . Smokeless tobacco: Never Used  . Alcohol Use: No    Review of Systems  Constitutional: Negative for fever. Eyes: Negative for visual changes. ENT: Negative for sore throat. Cardiovascular: Negative for chest pain. Respiratory: Positive for cough and shortness of breath Gastrointestinal: Negative for abdominal pain, vomiting and diarrhea. Genitourinary: Negative for dysuria. Musculoskeletal: Negative for back pain. Skin: Negative for rash. Neurological: Negative for headaches, focal weakness or numbness.   10-point ROS otherwise negative.  ____________________________________________   PHYSICAL EXAM:  VITAL SIGNS: ED Triage Vitals  Enc Vitals Group     BP 03/04/15 0923 80/56 mmHg     Pulse Rate 03/04/15 0923 100     Resp 03/04/15 0923 24     Temp 03/04/15 0923 97.5 F (36.4 C)     Temp Source 03/04/15 0923 Oral  SpO2 03/04/15 0923 90 %     Weight 03/04/15 0923 134 lb (60.782 kg)     Height 03/04/15 0923 5\' 5"  (1.651 m)     Head Cir --      Peak Flow --      Pain Score --      Pain Loc --       Pain Edu? --      Excl. in GC? --     Constitutional: Alert and oriented. Apparent respiratory distress. Eyes: Conjunctivae are normal. PERRL. Normal extraocular movements. ENT   Head: Normocephalic and atraumatic.   Nose: No congestion/rhinnorhea.   Mouth/Throat: Mucous membranes are moist.   Neck: No stridor. Hematological/Lymphatic/Immunilogical: No cervical lymphadenopathy. Cardiovascular: Normal rate, regular rhythm. Normal and symmetric distal pulses are present in all extremities. No murmurs, rubs, or gallops. Respiratory: Tachypnea, positive accessory respiratory muscle use, diffuse rhonchi on auscultation Gastrointestinal: Soft and nontender. No distention. There is no CVA tenderness. Genitourinary: deferred Musculoskeletal: Nontender with normal range of motion in all extremities. No joint effusions.  No lower extremity tenderness nor edema. Neurologic:  Normal speech and language. No gross focal neurologic deficits are appreciated. Speech is normal.  Skin:  Skin is warm, dry and intact. No rash noted. Psychiatric: Mood and affect are normal. Speech and behavior are normal. Patient exhibits appropriate insight and judgment.  ____________________________________________    LABS (pertinent positives/negatives)  Labs Reviewed  BASIC METABOLIC PANEL - Abnormal; Notable for the following:    Potassium 3.0 (*)    Chloride 100 (*)    All other components within normal limits  CBC - Abnormal; Notable for the following:    WBC 20.4 (*)    All other components within normal limits  BLOOD GAS, ARTERIAL - Abnormal; Notable for the following:    pH, Arterial 7.50 (*)    pO2, Arterial 75 (*)    Bicarbonate 33.5 (*)    Acid-Base Excess 9.2 (*)    Allens test (pass/fail) POSITIVE (*)    All other components within normal limits  RAPID INFLUENZA A&B ANTIGENS ([facility] ONLY)  TROPONIN I     ____________________________________________   EKG  ED ECG REPORT I,  Lamis Behrmann, [place] N, the attending physician, personally viewed and interpreted this ECG.   Date: 03/05/2015  EKG Time: 9:28AM  Rate: 86  Rhythm: Normal sinus rhythm  Axis: Normal  Intervals: Normal  ST&T Change: None   ____________________________________________    RADIOLOGY    DG Chest Port 1 View (Final result) Result time: 03/04/15 10:25:53   Final result by Rad Results In Interface (03/04/15 10:25:53)   Narrative:   CLINICAL DATA: Dyspnea since last night. Hypoxia.  EXAM: PORTABLE CHEST 1 VIEW  COMPARISON: 11/15/2014.  FINDINGS: The cardiac silhouette remains borderline enlarged. The lungs are mildly hyperexpanded with decreased linear density at both lung bases. The interstitial markings remain minimally prominent with minimal peribronchial thickening. Minimal bilateral pleural thickening without significant change. Diffuse osteopenia. Thoracic spine degenerative changes.  IMPRESSION: 1. No acute abnormality. 2. Decreased bibasilar linear atelectasis or scarring. 3. Mild changes of COPD and chronic bronchitis.   Electronically Signed By: Beckie Salts M.D. On: 03/04/2015 10:25        Critical Care performed: CRITICAL CARE Performed by: Bayard Males N   Total critical care time: 30 minutes  Critical care time was exclusive of separately billable procedures and treating other patients.  Critical care was necessary to treat or prevent imminent or life-threatening deterioration.  Critical care was time spent  personally by me on the following activities: development of treatment plan with patient and/or surrogate as well as nursing, discussions with consultants, evaluation of patient's response to treatment, examination of patient, obtaining history from patient or surrogate, ordering and performing treatments and interventions, ordering and review of laboratory studies, ordering and review of radiographic studies, pulse oximetry and  re-evaluation of patient's condition.      INITIAL IMPRESSION / ASSESSMENT AND PLAN / ED COURSE  Pertinent labs & imaging results that were available during my care of the patient were reviewed by me and considered in my medical decision making (see chart for details).  Patient received 2 DuoNeb's and IV Solu-Medrol 125 mg with minimal improvement in respiratory status patient still tachypneic with a respiratory rate of 24. As such patient discussed with Dr. Genella Mech for hospital admission for further evaluation and management  ____________________________________________   FINAL CLINICAL IMPRESSION(S) / ED DIAGNOSES  Final diagnoses:  Chronic obstructive pulmonary disease, unspecified COPD type (HCC)  Pneumonia    Darci Current, MD 03/05/15 [phone]

## 2015-03-04 NOTE — Telephone Encounter (Signed)
Patient called saying that she has had difficulty breathing since last night. She reports that she normally uses 2L of O2 daily, but she had to increase it to 4L. She reports that she checked her O2 and the lowest it has been is at 82%. She reports that it was stable around 85-90%. Patient's breathing was labored on the phone, and per Dr. Elease Hashimoto she needs to go to the ER. Advised patient to F/U with [place] after being evaluated in the ER. Patient verbalized understanding. Cancelled patient's appt today at 2:15pm.

## 2015-03-04 NOTE — ED Notes (Signed)
Attempted to call report at this time. Nurse is at lunch. States they will call back.

## 2015-03-04 NOTE — H&P (Signed)
[facility] - [place] at [facility]   PATIENT NAME: Samantha Goodman    MR#:  [id number]  DATE OF BIRTH:  Mar 06, 1935  DATE OF ADMISSION:  03/04/2015  PRIMARY CARE PHYSICIAN: Mila Merry, MD   REQUESTING/REFERRING PHYSICIAN: Dr. Manson Passey  CHIEF COMPLAINT:  Shortness of breath HISTORY OF PRESENT ILLNESS:  Samantha Goodman  is a 80 y.o. female with a known history of chronic respiratory failure on 2 L of oxygen with COPD who presents to above complaint. Patient reports since yesterday she had cough, congestion, increased oxygen requirement and wheezing. In the emergency room she is treated for COPD exacerbation. She has received IV steroids and nebulizers, however patient has persistent hypoxia and bilateral wheezing on examination.  PAST MEDICAL HISTORY:   Past Medical History  Diagnosis Date  . COPD (chronic obstructive pulmonary disease) (HCC)   . Asthma   . CAD (coronary artery disease)   . Unstable angina pectoris (HCC)   . Hyperlipemia   . Agitation   . Fall   . Hypothyroid     PAST SURGICAL HISTORY:   Past Surgical History  Procedure Laterality Date  . Eye surgery    . Abdominal hysterectomy    . Appendectomy    . Ankle surgery Right     SOCIAL HISTORY:   Social History  Substance Use Topics  . Smoking status: Former Smoker -- 0.25 packs/day for 65 years    Quit date: 11/11/2013  . Smokeless tobacco: Never Used  . Alcohol Use: No    FAMILY HISTORY:   Family History  Problem Relation Age of Onset  . Stroke Mother   . Prostate cancer Father   . CAD Brother   . Ovarian cancer Sister   . Breast cancer Sister   . Lung cancer Sister     DRUG ALLERGIES:   Allergies  Allergen Reactions  . Codeine Nausea And Vomiting  . Fluticasone-Salmeterol Other (See Comments)    Reaction:  Unknown   . Levofloxacin Other (See Comments)    Reaction:  Confusion and hallucinations   . Tiotropium Bromide Monohydrate Rash     REVIEW OF SYSTEMS:   CONSTITUTIONAL: No fever, fatigue or weakness.  EYES: No blurred or double vision.  EARS, NOSE, AND THROAT: No tinnitus or ear pain.  RESPIRATORY: ++cough, shortness of breath, wheezing  NO hemoptysis.  CARDIOVASCULAR:NOchest pain, orthopnea, edema.  GASTROINTESTINAL: No nausea, vomiting, diarrhea or abdominal pain.  GENITOURINARY: No dysuria, hematuria.  ENDOCRINE: No polyuria, nocturia,  HEMATOLOGY: No anemia, easy bruising or bleeding SKIN: No rash or lesion. MUSCULOSKELETAL: No joint pain or arthritis.   NEUROLOGIC: No tingling, numbness, weakness.  PSYCHIATRY: No anxiety or depression.   MEDICATIONS AT HOME:   Prior to Admission medications   Medication Sig Start Date End Date Taking? Authorizing Provider  acetaminophen (TYLENOL) 500 MG tablet Take 1,000 mg by mouth every 6 (six) hours as needed for mild pain.     Historical Provider, MD  aspirin EC 81 MG tablet Take 81 mg by mouth daily.    Historical Provider, MD  atorvastatin (LIPITOR) 40 MG tablet Take 40 mg by mouth at bedtime.    Historical Provider, MD  budesonide-formoterol (SYMBICORT) 160-4.5 MCG/ACT inhaler Inhale 2 puffs into the lungs 2 (two) times daily.    Historical Provider, MD  cholecalciferol (VITAMIN D) 1000 UNITS tablet Take 1,000 Units by mouth daily.    Historical Provider, MD  clopidogrel (PLAVIX) 75 MG tablet Take 75 mg by mouth at bedtime.  Historical Provider, MD  ferrous sulfate 325 (65 FE) MG tablet take 1 tablet by mouth every evening 01/23/15   Lorie Phenix, MD  fluticasone [facility]) 50 MCG/ACT nasal spray Place 2 sprays into both nostrils daily as needed for rhinitis.    Historical Provider, MD  furosemide (LASIX) 20 MG tablet Take 1 tablet (20 mg total) by mouth daily. 10/16/14   Lorie Phenix, MD  guaiFENesin (MUCINEX) 600 MG 12 hr tablet Take 1 tablet (600 mg total) by mouth 2 (two) times daily. 11/17/14   Gale Journey, MD  ipratropium-albuterol (DUONEB) 0.5-2.5 (3) MG/3ML SOLN Take 3 mLs by  nebulization 4 (four) times daily as needed (for shortness of breath/wheezing).    Historical Provider, MD  levothyroxine (SYNTHROID, LEVOTHROID) 88 MCG tablet take 1 tablet by mouth daily 02/06/15   Margaretann Loveless, PA-C  LORazepam (ATIVAN) 0.5 MG tablet Take 0.5 mg by mouth every 6 (six) hours as needed for anxiety.    Historical Provider, MD  metoprolol succinate (TOPROL-XL) 25 MG 24 hr tablet Take 12.5 mg by mouth daily.    Historical Provider, MD  montelukast (SINGULAIR) 10 MG tablet Take 1 tablet (10 mg total) by mouth daily. 08/22/14   Lorie Phenix, MD  pantoprazole (PROTONIX) 40 MG tablet Take 40 mg by mouth daily.    Historical Provider, MD  polyethylene glycol (MIRALAX / GLYCOLAX) packet TAKE 1 PACKET BY MOUTH DAILY AS DIRECTED 01/02/15   Lorie Phenix, MD  Potassium 99 MG TABS Take 99 mg by mouth daily.     Historical Provider, MD  predniSONE (DELTASONE) 10 MG tablet 6 po for 2 days and then 5 po for 2 days and then 4 po for 2 days and 3 po for 2 days and then 2 po for 2 days and then 1 po for 2 days. 02/19/15   Lorie Phenix, MD  PROAIR HFA 108 (90 BASE) MCG/ACT inhaler Inhale 2 puffs into the lungs every 4 (four) hours as needed for wheezing or shortness of breath. 10/29/14   Lorie Phenix, MD  THEO-24 200 MG 24 hr capsule take 1 capsule by mouth once daily 11/23/14   Lorie Phenix, MD      VITAL SIGNS:  Blood pressure 108/69, pulse 75, temperature 97.5 F (36.4 C), temperature source Oral, resp. rate 17, height  (1.651 m), weight 60.782 kg (134 lb), SpO2 100 %.  PHYSICAL EXAMINATION:  GENERAL:  80 y.o.-year-old patient sitting up in bed without acute distress EYES: Pupils equal, round, reactive to light and accommodation. No scleral icterus. Extraocular muscles intact.  HEENT: Head atraumatic, normocephalic. Oropharynx and nasopharynx clear.  NECK:  Supple, no jugular venous distention. No thyroid enlargement, no tenderness.  LUNGS: Bilateral diffuse wheezing with prolonged  expiration no crackles or rhonchi heard. No use of assess her muscles. CARDIOVASCULAR: Sinus tachycardia. No murmurs, rubs, or gallops.  ABDOMEN: Soft, nontender, nondistended. Bowel sounds present. No organomegaly or mass.  EXTREMITIES: No pedal edema, cyanosis, or clubbing.  NEUROLOGIC: Cranial nerves II through XII are grossly intact. No focal deficits. PSYCHIATRIC: The patient is alert and oriented x 3.  SKIN: No obvious rash, lesion, or ulcer.   LABORATORY PANEL:   CBC  Recent Labs Lab 03/04/15 0938  WBC 20.4*  HGB 13.0  HCT 39.6  PLT 316   ------------------------------------------------------------------------------------------------------------------  Chemistries   Recent Labs Lab 03/04/15 0938  NA 142  K 3.0*  CL 100*  CO2 32  GLUCOSE 88  BUN 17  CREATININE 0.77  CALCIUM  9.0   ------------------------------------------------------------------------------------------------------------------  Cardiac Enzymes  Recent Labs Lab 03/04/15 0938  TROPONINI 0.03   ------------------------------------------------------------------------------------------------------------------  RADIOLOGY:  Dg Chest Port 1 View  03/04/2015  CLINICAL DATA:  Dyspnea since last night.  Hypoxia. EXAM: PORTABLE CHEST 1 VIEW COMPARISON:  11/15/2014. FINDINGS: The cardiac silhouette remains borderline enlarged. The lungs are mildly hyperexpanded with decreased linear density at both lung bases. The interstitial markings remain minimally prominent with minimal peribronchial thickening. Minimal bilateral pleural thickening without significant change. Diffuse osteopenia. Thoracic spine degenerative changes. IMPRESSION: 1. No acute abnormality. 2. Decreased bibasilar linear atelectasis or scarring. 3. Mild changes of COPD and chronic bronchitis. Electronically Signed   By: Beckie Salts M.D.   On: 03/04/2015 10:25    EKG:   No ST elevation or depression  IMPRESSION AND PLAN:    80 year  female with a history of chronic respiratory failure on 2 L oxygen due to COPD who presents with acute COPD exacerbation.  1. Acute on chronic respiratory failure: This is secondary to COPD exacerbation. Plan as outlined below.  2. Acute COPD exacerbation: Chest x-ray thus far shows no evidence of pneumonia. Patient has a significant cough and I am worried she could have an underlying pneumonia. Chest x-ray of be repeated for a.m. Continue IV Solu-Medrol, nebulizer and inhalers. Patient is on theophylline at home which I will continue as well. Continue to wean up oxygen to 2 L as she has at baseline. Continue Rocephin and azithromycin.  3. Hypokalemia: Patient is on potassium supplementation which we will continue and add 1 dose for today.  4. Hypothyroidism: Continue Synthroid. Next an 5. Essential hypertension: Continue metoprolol and monitor blood pressure. Next  6. CAD: Continue aspirin, atorvastatin, Plavix and the Toprol.    All the records are reviewed and case discussed with ED provider. Management plans discussed with the patient and she is in agreement.  CODE STATUS: dnr  TOTAL TIME TAKING CARE OF THIS PATIENT: 50 minutes.    Esvin Hnat M.D on 03/04/2015 at 12:37 PM  Between 7am to 6pm - Pager - [phone] After 6pm go to [email or web address] - password [email or web address] [facility]  [place] [facility]  Office  [phone]  CC: Primary care physician; Mila Merry, MD

## 2015-03-04 NOTE — ED Notes (Signed)
Pt is O2 dependant, pt reports increased shortness of breath which started yesterday, O2 sat on 2l in triage 90%, pt placed on ED portable tank  via [place]

## 2015-03-05 ENCOUNTER — Inpatient Hospital Stay: Payer: Medicare Other

## 2015-03-05 LAB — POTASSIUM: Potassium: 3.7 mmol/L (ref 3.5–5.1)

## 2015-03-05 LAB — MAGNESIUM: MAGNESIUM: 2.1 mg/dL (ref 1.7–2.4)

## 2015-03-05 NOTE — Progress Notes (Signed)
[facility] -  at [facility]   PATIENT NAME: Samantha Goodman    MR#:  [id number]  DATE OF BIRTH:  09-14-1935  SUBJECTIVE:  CHIEF COMPLAINT:   Chief Complaint  Patient presents with  . Shortness of Breath   coughing a lot, feels somewhat better, very pleasant  REVIEW OF SYSTEMS:  Review of Systems  Constitutional: Negative for fever, weight loss, malaise/fatigue and diaphoresis.  HENT: Negative for ear discharge, ear pain, hearing loss, nosebleeds, sore throat and tinnitus.   Eyes: Negative for blurred vision and pain.  Respiratory: Positive for cough, sputum production, shortness of breath and wheezing. Negative for hemoptysis.   Cardiovascular: Negative for chest pain, palpitations, orthopnea and leg swelling.  Gastrointestinal: Negative for heartburn, nausea, vomiting, abdominal pain, diarrhea, constipation and blood in stool.  Genitourinary: Negative for dysuria, urgency and frequency.  Musculoskeletal: Negative for myalgias and back pain.  Skin: Negative for itching and rash.  Neurological: Negative for dizziness, tingling, tremors, focal weakness, seizures, weakness and headaches.  Psychiatric/Behavioral: Negative for depression. The patient is not nervous/anxious.     DRUG ALLERGIES:   Allergies  Allergen Reactions  . Codeine Nausea And Vomiting  . Fluticasone-Salmeterol Other (See Comments)    Reaction:  Unknown   . Levofloxacin Other (See Comments)    Reaction:  Confusion and hallucinations   . Tiotropium Bromide Monohydrate Rash   VITALS:  Blood pressure 134/58, pulse 82, temperature 97.5 F (36.4 C), temperature source Oral, resp. rate 22, height  (1.651 m), weight 60.782 kg (134 lb), SpO2 97 %. PHYSICAL EXAMINATION:  Physical Exam  Constitutional: She is oriented to person, place, and time and well-developed, well-nourished, and in no distress.  HENT:  Head: Normocephalic and atraumatic.  Eyes: Conjunctivae and EOM are normal.  Pupils are equal, round, and reactive to light.  Neck: Normal range of motion. Neck supple. No tracheal deviation present. No thyromegaly present.  Cardiovascular: Normal rate, regular rhythm and normal heart sounds.   Pulmonary/Chest: Accessory muscle usage present. Tachypnea noted. She is in respiratory distress. She has wheezes. She exhibits no tenderness.  Abdominal: Soft. Bowel sounds are normal. She exhibits no distension. There is no tenderness.  Musculoskeletal: Normal range of motion.  Neurological: She is alert and oriented to person, place, and time. No cranial nerve deficit.  Skin: Skin is warm and dry. No rash noted.  Psychiatric: Mood and affect normal.   LABORATORY PANEL:   CBC  Recent Labs Lab 03/04/15 0938  WBC 20.4*  HGB 13.0  HCT 39.6  PLT 316   ------------------------------------------------------------------------------------------------------------------ Chemistries   Recent Labs Lab 03/04/15 0938  NA 142  K 3.0*  CL 100*  CO2 32  GLUCOSE 88  BUN 17  CREATININE 0.77  CALCIUM 9.0   RADIOLOGY:  Dg Chest 1 View  03/05/2015  CLINICAL DATA:  History of COPD and asthma Coronary artery disease, clinical pneumonia. EXAM: CHEST 1 VIEW COMPARISON:  Portable chest x-ray of March 04, 2015 FINDINGS: The lungs are adequately inflated. Increased density at the left lung base is new. There is obscuration of the left hemidiaphragm. The cardiac silhouette remains enlarged. The pulmonary vascularity is mildly prominent centrally but stable. The observed bony thorax exhibits no acute abnormality. IMPRESSION: Increased atelectasis or small pleural effusion at the left lung base now obscuring the hemidiaphragm. Persistently increased pulmonary interstitial markings consistent with known underlying COPD. Stable mild cardiomegaly and central pulmonary vascular congestion. Electronically Signed   By: David  [place] M.D.  On: 03/05/2015 07:19   Dg Chest Port 1  View  03/04/2015  CLINICAL DATA:  Dyspnea since last night.  Hypoxia. EXAM: PORTABLE CHEST 1 VIEW COMPARISON:  11/15/2014. FINDINGS: The cardiac silhouette remains borderline enlarged. The lungs are mildly hyperexpanded with decreased linear density at both lung bases. The interstitial markings remain minimally prominent with minimal peribronchial thickening. Minimal bilateral pleural thickening without significant change. Diffuse osteopenia. Thoracic spine degenerative changes. IMPRESSION: 1. No acute abnormality. 2. Decreased bibasilar linear atelectasis or scarring. 3. Mild changes of COPD and chronic bronchitis. Electronically Signed   By: Beckie Salts M.D.   On: 03/04/2015 10:25   ASSESSMENT AND PLAN:  80 year female with a history of chronic respiratory failure on 2 L oxygen due to COPD who presents with acute COPD exacerbation.  1. Acute on chronic respiratory failure: This is secondary to COPD exacerbation. Plan as outlined below.  2. Acute COPD exacerbation: Chest x-ray thus far shows no evidence of pneumonia. Patient has a significant cough and I am worried she could have an underlying pneumonia. Chest x-ray of be repeated this a.m. Continue IV Solu-Medrol, nebulizer and inhalers. Patient is on theophylline at home which I will continue as well. Continue to wean up oxygen to 2 L as she has at baseline. Continue Rocephin and azithromycin for now. - We will initiate COPD Gold protocol as she is at very high risk for readmissions - Will also obtain CT scan of the chest  3. Hypokalemia: Replete and recheck  4. Hypothyroidism: Continue Synthroid.   5. Essential hypertension: Continue metoprolol and monitor blood pressure.   6. CAD: Continue aspirin, atorvastatin, Plavix and the Toprol.   Will discontinue cardiac monitor  All the records are reviewed and case discussed with Care Management/Social Worker. Management plans discussed with the patient, family and they are in  agreement.  CODE STATUS: DO NOT RESUSCITATE  TOTAL TIME TAKING CARE OF THIS PATIENT: 35 minutes.   More than 50% of the time was spent in counseling/coordination of care: YES  POSSIBLE D/C IN 2-3 DAYS, DEPENDING ON CLINICAL CONDITION.  And pulmonary evaluation   [facility], Rio Taber M.D on 03/05/2015 at 7:23 AM  Between 7am to 6pm - Pager - [phone]  After 6pm go to [email or web address] - password [email or web address]  [facility]  Office  [phone]  CC: Primary care physician; Mila Merry, MD  Note: This dictation was prepared with Dragon dictation along with smaller phrase technology. Any transcriptional errors that result from this process are unintentional.

## 2015-03-05 NOTE — Care Management Note (Signed)
Case Management Note  Patient Details  Name: AMBERLY LIVAS MRN: [id number] Date of Birth: Jun 03, 1935  Subjective/Objective:     79yo Mrs Jadda Hunsucker was admitted 03/04/15 with wheezing, cough, congestion. Dx: COPD exacerbation. Lives alone but has supportive family members who will help with meals, transportation and ADLs. PCP=Dr Elease Hashimoto. Pharmacy=[facility] on [address]. No home assistive equipment. Chronic 2L N/C supplied by [facility]. Currently open to [facility] and is receiving home health nursing services. Case management will follow for discharge planning.            Action/Plan:   Expected Discharge Date:  03/07/15               Expected Discharge Plan:     In-House Referral:     Discharge planning Services     Post Acute Care Choice:    Choice offered to:     DME Arranged:    DME Agency:     HH Arranged:    HH Agency:     Status of Service:     Medicare Important Message Given:    Date Medicare IM Given:    Medicare IM give by:    Date Additional Medicare IM Given:    Additional Medicare Important Message give by:     If discussed at Long Length of Stay Meetings, dates discussed:    Additional Comments:  Dekari Bures A, RN 03/05/2015, 11:42 AM

## 2015-03-05 NOTE — Progress Notes (Addendum)
Initial Nutrition Assessment   INTERVENTION:   Meals and Snacks: Cater to patient preferences Medical Food Supplement Therapy: will recommend Might[employer]akes on meal trays BID  for added nutrition (each supplement provides approximately 300kcals and 9g protein) Coordination of Care: will recommend collecting new weight tomorrow am   NUTRITION DIAGNOSIS:   Increased nutrient needs related to acute illness as evidenced by estimated needs.  GOAL:   Patient will meet greater than or equal to 90% of their needs  MONITOR:    (Energy Intake, Anthropometrics, Pulmonary Profile, Electrolyte and renal Profile)  REASON FOR ASSESSMENT:   Consult COPD Protocol  ASSESSMENT:   Pt admitted with COPD exacerbation.  Past Medical History  Diagnosis Date  . COPD (chronic obstructive pulmonary disease) (HCC)   . Asthma   . CAD (coronary artery disease)   . Unstable angina pectoris (HCC)   . Hyperlipemia   . Agitation   . Fall   . Hypothyroid      Diet Order:  Diet Heart Room service appropriate?: Yes; Fluid consistency:: Thin    Current Nutrition: Pt out of room on visit at procedure, Recorded 60% of breakfast per documentation  Food/Nutrition-Related History: Pt's sister in room on visit and reports pt has had a good appetite and does not drink Ensure/Boost that she was aware of. Per MST no decrease in appetite PTA.   Scheduled Medications:  . aspirin EC  81 mg Oral Daily  . atorvastatin  40 mg Oral QHS  . azithromycin  500 mg Intravenous QPC supper  . budesonide-formoterol  2 puff Inhalation BID  . cefTRIAXone (ROCEPHIN)  IV  1 g Intravenous QPC supper  . cholecalciferol  1,000 Units Oral Daily  . clopidogrel  75 mg Oral QHS  . enoxaparin (LOVENOX) injection  40 mg Subcutaneous Q24H  . ferrous sulfate  325 mg Oral QPM  . furosemide  20 mg Oral Daily  . levothyroxine  88 mcg Oral QAC breakfast  . methylPREDNISolone (SOLU-MEDROL) injection  60 mg Intravenous 3 times per day   . metoprolol succinate  12.5 mg Oral Daily  . montelukast  10 mg Oral Daily  . pantoprazole  40 mg Oral Daily  . theophylline  200 mg Oral Daily      Electrolyte/Renal Profile and Glucose Profile:   Recent Labs Lab 03/04/15 0938 03/05/15 0925  NA 142  --   K 3.0*  --   CL 100*  --   CO2 32  --   BUN 17  --   CREATININE 0.77  --   CALCIUM 9.0  --   MG  --  2.1  GLUCOSE 88  --    Protein Profile: No results for input(s): ALBUMIN in the last 168 hours.  Gastrointestinal Profile: Last BM:  03/04/2015   Nutrition-Focused Physical Exam Findings:  Unable to complete Nutrition-Focused physical exam at this time.    Weight Change: Per CHL weight trend pt with progressive weight loss (11% weight loss in 7-8 months)   Skin:  Reviewed, no issues   Height:   Ht Readings from Last 1 Encounters:  03/04/15  (1.651 m)    Weight:   Wt Readings from Last 1 Encounters:  03/04/15 134 lb (60.782 kg)   Wt Readings from Last 10 Encounters:  03/04/15 134 lb (60.782 kg)  02/19/15 147 lb (66.679 kg)  01/23/15 146 lb (66.225 kg)  11/21/14 147 lb (66.679 kg)  11/14/14 146 lb 1.6 oz (66.271 kg)  10/16/14 150 lb (68.04  kg)  07/13/14 150 lb (68.04 kg)     BMI:  Body mass index is 22.3 kg/(m^2).  Estimated Nutritional Needs:   Kcal:  BEE: 1085kcals, TEE: (IF 1.1-1.3)(AF 1.2) 1433-1693kcals  Protein:  60-72g protein (1.0-1.2g/kg)  Fluid:  1500-1830mL of fluid (25-52mL/kg)  EDUCATION NEEDS:   No education needs identified at this time   MODERATE Care Level  Leda Quail, RD, LDN Pager [phone] Weekend/On-Call Pager [phone]

## 2015-03-05 NOTE — Evaluation (Signed)
Physical Therapy Evaluation Patient Details Name: Samantha Goodman MRN: [id number] DOB: 1936-01-04 Today's Date: 03/05/2015   History of Present Illness  presented to ER secondary to cough, congestion and wheezing with increased O2 requirements; admitted with COPD exacerbation.  Clinical Impression  Upon evaluation, patient alert and oriented; follows all commands and demonstrates good insight/safety awareness. Bilat UE/LE strength and ROM grossly WFL throughout all extremities, functional for basic transfers and gait.  Able to complete bed mobility, sit/stand, basic transfers and gait (400') without assist device, mod indep.  Gait speed steady and age-appropriate, 2.26ft/second, without LOB or safety concern.   Maintains SaO2 >97% on 4 (decreased to 3L during session) at rest and with exertion; left on 3L end of session.  RN informed/aware. No skilled PT needs identified at this time, as patient mod indep for all mobility without assist device and at baseline level of functional ability.  Please re-consult should needs change. Did encourage continued mobility throughout unit with nursing throughout remaining hospitalization for maintenance of functional strength/endurance; patient/family voiced agreement and understanding.     Follow Up Recommendations No PT follow up    Equipment Recommendations       Recommendations for Other Services       Precautions / Restrictions Precautions Precautions: Fall Restrictions Weight Bearing Restrictions: No      Mobility  Bed Mobility Overal bed mobility: Modified Independent                Transfers Overall transfer level: Modified independent Equipment used: None Transfers: Sit to/from Stand Sit to Stand: Modified independent (Device/Increase time)            Ambulation/Gait Ambulation/Gait assistance: Modified independent (Device/Increase time) Ambulation Distance (Feet): 400 Feet Assistive device: None   Gait velocity: 10'  walk time, 5 seconds (2.31ft/sec)   General Gait Details: reciprocal stepping, mildly antalgic R LE (history of R ankle fracture/pinning); good trunk rotation and reciprocal arm swing.  Stairs            Wheelchair Mobility    Modified Rankin (Stroke Patients Only)       Balance Overall balance assessment: Needs assistance Sitting-balance support: No upper extremity supported;Feet supported Sitting balance-Leahy Scale: Good     Standing balance support: No upper extremity supported Standing balance-Leahy Scale: Good                               Pertinent Vitals/Pain Pain Assessment: No/denies pain    Home Living Family/patient expects to be discharged to:: Private residence Living Arrangements: Alone Available Help at Discharge: Family;Available PRN/intermittently Type of Home: House Home Access: Level entry     Home Layout: One level Home Equipment:  (Home O2 at 2L)      Prior Function Level of Independence: Independent         Comments: Indep with household and community mobility without assist device; + driving; denies fall history.     Hand Dominance        Extremity/Trunk Assessment   Upper Extremity Assessment: Overall WFL for tasks assessed           Lower Extremity Assessment: Overall WFL for tasks assessed         Communication   Communication: HOH  Cognition Arousal/Alertness: Awake/alert Behavior During Therapy: WFL for tasks assessed/performed Overall Cognitive Status: Within Functional Limits for tasks assessed  General Comments      Exercises        Assessment/Plan    PT Assessment Patent does not need any further PT services  PT Diagnosis     PT Problem List    PT Treatment Interventions     PT Goals (Current goals can be found in the Care Plan section) Acute Rehab PT Goals Patient Stated Goal: to go home today PT Goal Formulation: All assessment and education  complete, DC therapy    Frequency     Barriers to discharge        Co-evaluation               End of Session Equipment Utilized During Treatment: Gait belt;Oxygen Activity Tolerance: Patient tolerated treatment well Patient left: in chair;with call bell/phone within reach;with chair alarm set;with family/visitor present Nurse Communication: Mobility status (O2 at 3L end of session)         Time: 4098-1191 PT Time Calculation (min) (ACUTE ONLY): 11 min   Charges:   PT Evaluation $PT Eval Low Complexity: 1 Procedure     PT G Codes:       Shela Esses H. Manson Passey, PT, DPT, NCS 03/05/2015, 11:14 AM [phone]

## 2015-03-05 NOTE — Plan of Care (Signed)
Pt output is unknown since reports indicate that pt was independent on 3rd shift.  Though I/O was not documented on 3rd shift. Pt indicated she voided about 6AM 2/21.

## 2015-03-05 NOTE — Consult Note (Signed)
Pulmonary Critical Care  Initial Consult Note   ZOFIA PECKINPAUGH UJW:[id number] DOB: [birth year] DOA: 03/04/2015  Referring physician: Adrian Saran, MD PCP: Mila Merry, MD   Chief Complaint: SOB  HPI: SUVI ARCHULETTA is a 80 y.o. female with history of COPD oxygen depended presented with increased SOB noted. Patient had been having increased cough noted. Patient has been having some sputum production. She has no hemoptysis noted. Noted fleating chest pain. Patient has had no fever noted. No nausea or vomiting. Denies any c/o diarrhea or abdominal pain. She states that she is feeling better now compared to when she came in but still has some wheeze noted   Review of Systems:  Constitutional:  No weight loss, night sweats, Fevers, chills, +fatigue.  HEENT:  No headaches, nasal congestion, post nasal drip,  Cardio-vascular:  No chest pain, swelling in lower extremities, palpitations  GI:  No heartburn, indigestion, abdominal pain, nausea, vomiting, diarrhea  Resp:  +shortness of breath.+productive cough, +wheezing Skin:  no rash or lesions.  Musculoskeletal:  No joint pain or swelling.   Remainder ROS performed and is unremarkable other than noted in HPI  Past Medical History  Diagnosis Date  . COPD (chronic obstructive pulmonary disease) (HCC)   . Asthma   . CAD (coronary artery disease)   . Unstable angina pectoris (HCC)   . Hyperlipemia   . Agitation   . Fall   . Hypothyroid    Past Surgical History  Procedure Laterality Date  . Eye surgery    . Abdominal hysterectomy    . Appendectomy    . Ankle surgery Right    Social History:  reports that she quit smoking about 15 months ago. She has never used smokeless tobacco. She reports that she does not drink alcohol or use illicit drugs.  Allergies  Allergen Reactions  . Codeine Nausea And Vomiting  . Fluticasone-Salmeterol Other (See Comments)    Reaction:  Unknown   . Levofloxacin Other (See Comments)    Reaction:   Confusion and hallucinations   . Tiotropium Bromide Monohydrate Rash    Family History  Problem Relation Age of Onset  . Stroke Mother   . Prostate cancer Father   . CAD Brother   . Ovarian cancer Sister   . Breast cancer Sister   . Lung cancer Sister     Prior to Admission medications   Medication Sig Start Date End Date Taking? Authorizing Provider  acetaminophen (TYLENOL) 500 MG tablet Take 500-1,000 mg by mouth every 6 (six) hours as needed for mild pain, fever or headache.    Yes Historical Provider, MD  albuterol (PROVENTIL HFA;VENTOLIN HFA) 108 (90 Base) MCG/ACT inhaler Inhale 2 puffs into the lungs every 4 (four) hours as needed for wheezing or shortness of breath.   Yes Historical Provider, MD  aspirin EC 81 MG tablet Take 81 mg by mouth daily.   Yes Historical Provider, MD  atorvastatin (LIPITOR) 40 MG tablet Take 40 mg by mouth at bedtime.   Yes Historical Provider, MD  budesonide-formoterol (SYMBICORT) 160-4.5 MCG/ACT inhaler Inhale 2 puffs into the lungs 2 (two) times daily.   Yes Historical Provider, MD  cholecalciferol (VITAMIN D) 1000 UNITS tablet Take 1,000 Units by mouth daily.   Yes Historical Provider, MD  clopidogrel (PLAVIX) 75 MG tablet Take 75 mg by mouth at bedtime.   Yes Historical Provider, MD  ferrous sulfate 325 (65 FE) MG tablet Take 325 mg by mouth daily with breakfast.   Yes Historical  Provider, MD  fluticasone (FLONASE) 50 MCG/ACT nasal spray Place 2 sprays into both nostrils daily as needed for rhinitis.   Yes Historical Provider, MD  furosemide (LASIX) 20 MG tablet Take 1 tablet (20 mg total) by mouth daily. 10/16/14  Yes Lorie Phenix, MD  guaiFENesin (MUCINEX) 600 MG 12 hr tablet Take 1 tablet (600 mg total) by mouth 2 (two) times daily. 11/17/14  Yes Gale Journey, MD  ipratropium-albuterol (DUONEB) 0.5-2.5 (3) MG/3ML SOLN Take 3 mLs by nebulization 4 (four) times daily as needed (for wheezing/shortness of breath).    Yes Historical Provider, MD   levothyroxine (SYNTHROID, LEVOTHROID) 88 MCG tablet Take 88 mcg by mouth daily before breakfast.   Yes Historical Provider, MD  LORazepam (ATIVAN) 0.5 MG tablet Take 0.5 mg by mouth every 6 (six) hours as needed for anxiety.   Yes Historical Provider, MD  metoprolol succinate (TOPROL-XL) 25 MG 24 hr tablet Take 12.5 mg by mouth daily.   Yes Historical Provider, MD  montelukast (SINGULAIR) 10 MG tablet Take 10 mg by mouth at bedtime.   Yes Historical Provider, MD  pantoprazole (PROTONIX) 40 MG tablet Take 40 mg by mouth daily.   Yes Historical Provider, MD  polyethylene glycol (MIRALAX / GLYCOLAX) packet Take 17 g by mouth daily.   Yes Historical Provider, MD  Potassium 99 MG TABS Take 99 mg by mouth daily.    Yes Historical Provider, MD  theophylline (THEO-24) 200 MG 24 hr capsule Take 200 mg by mouth daily.   Yes Historical Provider, MD   Physical Exam: Filed Vitals:   03/05/15 0449 03/05/15 0818 03/05/15 0842 03/05/15 1105  BP: 134/58 131/52 128/56   Pulse: 82 88 82   Temp: 97.5 F (36.4 C) 97.9 F (36.6 C)    TempSrc: Oral Oral    Resp: 22 18    Height:      Weight:      SpO2: 97% 95%  97%    Wt Readings from Last 3 Encounters:  03/04/15 60.782 kg (134 lb)  02/19/15 66.679 kg (147 lb)  01/23/15 66.225 kg (146 lb)    General:  Appears calm and comfortable Eyes: PERRL, normal lids, irises & conjunctiva ENT: grossly normal hearing, lips & tongue Neck: no LAD, masses or thyromegaly Cardiovascular: RRR, no m/r/g. No LE edema. Respiratory: diffuse bilateral rhonchi. Normal respiratory effort. Abdomen: soft, nontender Skin: no rash or induration seen on limited exam Musculoskeletal: grossly normal tone BUE/BLE Psychiatric: grossly normal mood and affect Neurologic: grossly non-focal.          Labs on Admission:  Basic Metabolic Panel:  Recent Labs Lab 03/04/15 0938 03/05/15 0925  NA 142  --   K 3.0*  --   CL 100*  --   CO2 32  --   GLUCOSE 88  --   BUN 17  --    CREATININE 0.77  --   CALCIUM 9.0  --   MG  --  2.1   Liver Function Tests: No results for input(s): AST, ALT, ALKPHOS, BILITOT, PROT, ALBUMIN in the last 168 hours. No results for input(s): LIPASE, AMYLASE in the last 168 hours. No results for input(s): AMMONIA in the last 168 hours. CBC:  Recent Labs Lab 03/04/15 0938  WBC 20.4*  HGB 13.0  HCT 39.6  MCV 82.2  PLT 316   Cardiac Enzymes:  Recent Labs Lab 03/04/15 0938  TROPONINI 0.03    BNP (last 3 results) No results for input(s): BNP in the  last 8760 hours.  ProBNP (last 3 results) No results for input(s): PROBNP in the last 8760 hours.  CBG: No results for input(s): GLUCAP in the last 168 hours.  Radiological Exams on Admission: Dg Chest 1 View  03/05/2015  CLINICAL DATA:  History of COPD and asthma Coronary artery disease, clinical pneumonia. EXAM: CHEST 1 VIEW COMPARISON:  Portable chest x-ray of March 04, 2015 FINDINGS: The lungs are adequately inflated. Increased density at the left lung base is new. There is obscuration of the left hemidiaphragm. The cardiac silhouette remains enlarged. The pulmonary vascularity is mildly prominent centrally but stable. The observed bony thorax exhibits no acute abnormality. IMPRESSION: Increased atelectasis or small pleural effusion at the left lung base now obscuring the hemidiaphragm. Persistently increased pulmonary interstitial markings consistent with known underlying COPD. Stable mild cardiomegaly and central pulmonary vascular congestion. Electronically Signed   By: David  [place] M.D.   On: 03/05/2015 07:19   Ct Chest Wo Contrast  03/05/2015  CLINICAL DATA:  History of COPD, chronic cough and difficulty breathing for 2-3 years. Worsening cough since Sunday. EXAM: CT CHEST WITHOUT CONTRAST TECHNIQUE: Multidetector CT imaging of the chest was performed following the standard protocol without IV contrast. COMPARISON:  Chest radiograph 03/05/2015, CT abdomen and pelvis  02/09/2014, two-view chest radiograph 12/24/2013 FINDINGS: Heavy coronary artery atherosclerotic calcification involves all 3 coronary arteries. Mild cardiomegaly. Scattered areas of atherosclerotic calcification involving the normal caliber thoracic aorta and the great vessel origins. Small hiatal hernia.  Negative for lymphadenopathy. There is a trace left pleural effusion. This is new compared to the abdominal CT of February 09, 2014. Mild changes of centrilobular emphysema are visualized in the upper lobe apices bilaterally. There is pleural parenchymal scarring of both lung apices, right greater than left. I bronchial wall thickening is noted in the lower lobes bilaterally. There are scattered areas of atelectasis at the extreme lung bases in both lower lobes. No definite significant airspace consolidation is identified in either lung. There is a 5 mm nonspecific opacity in the left lower lobe on image number 29 of the lung windows, most likely due to inflammation or infection. This area is not included on the abdominal CT of January 29. Upper abdomen: Multiple peripherally calcified gallstones are seen within the dependent portion of the gallbladder. No gallbladder wall thickening is appreciated, but the entire gallbladder is not included on the images. There is atherosclerotic calcification of the abdominal aorta. 10 mm water density exophytic lesion extending from the medial upper pole the right kidney appears unchanged. 10 mm exophytic water density lesion extending from the anterior upper pole the left kidney appears unchanged. Extrarenal pelvis noted on the right kidney. Diffuse demineralization of the bones. There are multiple compression fractures, including moderate compression fractures of T6, T7, mild compression fracture of T8, severe compression fracture centrally at T12, and mild superior endplate prior fracture of L1. There is a bony spur to the left of the midline in the spinal canal at the T2-T3  level which narrows the CSF space and may contact the spinal cord. IMPRESSION: 1. Trace left pleural effusion. 2. Bilateral lower lobe bronchial wall thickening may be chronic and/or acute. There is a tiny approximately E 5 mm nonspecific airspace opacity in the left lower lobe on image number 29 of the lung windows that most likely reflects infection or inflammation. 3. Mild centrilobular emphysema. 4. Heavy three-vessel coronary artery atherosclerosis and mild cardiomegaly. 5. Multiple compression fractures of the visualized thoracolumbar spine. 6. Bony spur  projects to the left of midline in the central spinal canal at T2-T3. 7. Partially visualized renal cysts. 8. Cholelithiasis. Electronically Signed   By: Britta Mccreedy M.D.   On: 03/05/2015 10:59   Dg Chest Port 1 View  03/04/2015  CLINICAL DATA:  Dyspnea since last night.  Hypoxia. EXAM: PORTABLE CHEST 1 VIEW COMPARISON:  11/15/2014. FINDINGS: The cardiac silhouette remains borderline enlarged. The lungs are mildly hyperexpanded with decreased linear density at both lung bases. The interstitial markings remain minimally prominent with minimal peribronchial thickening. Minimal bilateral pleural thickening without significant change. Diffuse osteopenia. Thoracic spine degenerative changes. IMPRESSION: 1. No acute abnormality. 2. Decreased bibasilar linear atelectasis or scarring. 3. Mild changes of COPD and chronic bronchitis. Electronically Signed   By: Beckie Salts M.D.   On: 03/04/2015 10:25    EKG: Independently reviewed.  Assessment/Plan Active Problems:   COPD (chronic obstructive pulmonary disease) (HCC)   1. Acute on Chronic Respiratory Failure with hypoxia -she is on oxygen currently -Monitor ABG as needed -will keep SaO2 >88% -no need for BIPAP at this time  2. COPD with exacerbation -she will continue with IV steroids -continue with inhalers -nebs as needed -currently on azithromycin and rocephin will be continued     I  have personally obtained a history, examined the patient, evaluated laboratory and imaging results, formulated the assessment and plan and placed orders.  The Patient requires high complexity decision making for assessment and support.    Yevonne Pax, MD [facility] Pulmonary Critical Care Medicine Sleep Medicine

## 2015-03-05 NOTE — Progress Notes (Signed)
OT Cancellation Note  Patient Details Name: Samantha Goodman MRN: [id number] DOB: February 14, 1935   Cancelled Treatment:    Reason Eval/Treat Not Completed: OT screened, no needs identified, will sign off. Patient denise shortness of breath during lower body dressing. Walked out of bathroom independently. Reviewed energy saving techniques. No further Occupational Therapy needed.  Ocie Cornfield 03/05/2015, 1:59 PM

## 2015-03-05 NOTE — Clinical Social Work Note (Signed)
Clinical Social Work Assessment  Patient Details  Name: Samantha Goodman MRN: [id number] Date of Birth: [birth year]  Date of referral:  03/05/15               Reason for consult:  Other (Comment Required) (COPD Gold )                Permission sought to share information with:    Permission granted to share information::     Name::        Agency::     Relationship::     Contact Information:     Housing/Transportation Living arrangements for the past 2 months:  Single Family Home Source of Information:  Patient Patient Interpreter Needed:  None Criminal Activity/Legal Involvement Pertinent to Current Situation/Hospitalization:  No - Comment as needed Significant Relationships:  Adult Children, Other Family Members Lives with:  Self Do you feel safe going back to the place where you live?  Yes Need for family participation in patient care:  No (Coment)  Care giving concerns:  Patient lives alone in an apartment in [place].    Social Worker assessment / plan:  Holiday representative (CSW) received COPD Gold consult. PT is pending. CSW met with patient alone at bedside. Patient was alert and oriented and sitting up in the bed. CSW introduced self and explained role of CSW department. Per patient she lives alone in an apartment in [place]. Patient reported that she is independent with all her ADL's and still drives. Per patient she is chronically on 2 liters of oxygen. Patient reported that she has 2 daughters that live in [place], several grandchildren and great grandchildren. Patient spoke fondly of her 7 y.o granddaughter who is highly intelligent and is going to early college. Patient reported that she has a strong family support. Patient reported that when she is sick her family will bring her meals and medications. Patient reported that she is followed by [facility]. CSW screened patient for anxiety and depression. Patient denied anxiety and depression symptoms. Patient  reported no needs at this time. RN Case Manager is aware of above. CSW will continue to follow and assist as needed.   Employment status:  Retired Nurse, adult PT Recommendations:  Not assessed at this time Information / Referral to community resources:     Patient/Family's Response to care:  Patient reported no needs at this time.   Patient/Family's Understanding of and Emotional Response to Diagnosis, Current Treatment, and Prognosis:  Patient was pleasant throughout assessment.   Emotional Assessment Appearance:  Appears stated age Attitude/Demeanor/Rapport:    Affect (typically observed):  Accepting, Adaptable, Pleasant Orientation:  Oriented to Self, Oriented to Place, Oriented to  Time, Oriented to Situation Alcohol / Substance use:  Not Applicable Psych involvement (Current and /or in the community):  No (Comment)  Discharge Needs  Concerns to be addressed:  Discharge Planning Concerns Readmission within the last 30 days:  No Current discharge risk:  Chronically ill Barriers to Discharge:  Continued Medical Work up   Loralyn Freshwater, LCSW 03/05/2015, 9:52 AM

## 2015-03-06 ENCOUNTER — Telehealth: Payer: Self-pay | Admitting: Family Medicine

## 2015-03-06 LAB — BASIC METABOLIC PANEL
ANION GAP: 8 (ref 5–15)
BUN: 24 mg/dL — ABNORMAL HIGH (ref 6–20)
CHLORIDE: 103 mmol/L (ref 101–111)
CO2: 34 mmol/L — AB (ref 22–32)
Calcium: 9.1 mg/dL (ref 8.9–10.3)
Creatinine, Ser: 0.82 mg/dL (ref 0.44–1.00)
GFR calc non Af Amer: >60 mL/min (ref >60)
GLUCOSE: 137 mg/dL — AB (ref 65–99)
POTASSIUM: 3.9 mmol/L (ref 3.5–5.1)
Sodium: 145 mmol/L (ref 135–145)

## 2015-03-06 LAB — CBC
HCT: 36.1 % (ref 35.0–47.0)
Hemoglobin: 11.8 g/dL — ABNORMAL LOW (ref 12.0–16.0)
MCH: 27 pg (ref 26.0–34.0)
MCHC: 32.6 g/dL (ref 32.0–36.0)
MCV: 82.9 fL (ref 80.0–100.0)
PLATELETS: 391 10*3/uL (ref 150–440)
RBC: 4.35 MIL/uL (ref 3.80–5.20)
RDW: 15 % — AB (ref 11.5–14.5)
WBC: 14.1 10*3/uL — AB (ref 3.6–11.0)

## 2015-03-06 LAB — MAGNESIUM: Magnesium: 2.3 mg/dL (ref 1.7–2.4)

## 2015-03-06 MED ORDER — AZITHROMYCIN 250 MG PO TABS: 250.0000 mg | ORAL_TABLET | Freq: Every day | ORAL | Status: DC

## 2015-03-06 MED ORDER — PREDNISONE 10 MG (21) PO TBPK: 10.0000 mg | ORAL_TABLET | Freq: Every day | ORAL | Status: DC

## 2015-03-06 NOTE — Telephone Encounter (Signed)
Pt is scheduled with Dr. Sherrie Mustache for hospital f/u on 03/13/15. After I scheduled appt I noticed that Dr. Sherrie Mustache was listed as pt's PCP but had mostly seen Dr. Elease Hashimoto. Dr. Elease Hashimoto advised that pt was going to be transferring to Dr. Sherrie Mustache and I could schedule with either provider. Since the hospital called and scheduled the appt and I had no way of contacting the person with [facility] that had called to get the appt, I left appt on Dr. Theodis Aguas schedule. Thanks TNP

## 2015-03-06 NOTE — Care Management Important Message (Signed)
Important Message  Patient Details  Name: AAYLIAH ROTENBERRY MRN: [id number] Date of Birth: 1935-01-13   Medicare Important Message Given:  Yes    Leeann Bady A, RN 03/06/2015, 8:15 AM

## 2015-03-06 NOTE — Progress Notes (Signed)
Discharge instructions given with verbalized understanding.  Patient denies pain prior to discharge.  Items locked in safe returned by security.  Patient taken to emergency room entrance to drive self home in personal vehicle.

## 2015-03-06 NOTE — Discharge Instructions (Signed)
Chronic Obstructive Pulmonary Disease °Chronic obstructive pulmonary disease (COPD) is a common lung problem. In COPD, the flow of air from the lungs is limited. The way your lungs work will probably never return to normal, but there are things you can do to improve your lungs and make yourself feel better. Your doctor may treat your condition with: °· Medicines. °· Oxygen. °· Lung surgery. °· Changes to your diet. °· Rehabilitation. This may involve a team of specialists. °HOME CARE °· Take all medicines as told by your doctor. °· Avoid medicines or cough syrups that dry up your airway (such as antihistamines) and do not allow you to get rid of thick spit. You do not need to avoid them if told differently by your doctor. °· If you smoke, stop. Smoking makes the problem worse. °· Avoid being around things that make your breathing worse (like smoke, chemicals, and fumes). °· Use oxygen therapy and therapy to help improve your lungs (pulmonary rehabilitation) if told by your doctor. If you need home oxygen therapy, ask your doctor if you should buy a tool to measure your oxygen level (oximeter). °· Avoid people who have a sickness you can catch (contagious). °· Avoid going outside when it is very hot, cold, or humid. °· Eat healthy foods. Eat smaller meals more often. Rest before meals. °· Stay active, but remember to also rest. °· Make sure to get all the shots (vaccines) your doctor recommends. Ask your doctor if you need a pneumonia shot. °· Learn and use tips on how to relax. °· Learn and use tips on how to control your breathing as told by your doctor. Try: °¨ Breathing in (inhaling) through your nose for 1 second. Then, pucker your lips and breath out (exhale) through your lips for 2 seconds. °¨ Putting one hand on your belly (abdomen). Breathe in slowly through your nose for 1 second. Your hand on your belly should move out. Pucker your lips and breathe out slowly through your lips. Your hand on your belly  should move in as you breathe out. °· Learn and use controlled coughing to clear thick spit from your lungs. The steps are: °1. Lean your head a little forward. °2. Breathe in deeply. °3. Try to hold your breath for 3 seconds. °4. Keep your mouth slightly open while coughing 2 times. °5. Spit any thick spit out into a tissue. °6. Rest and do the steps again 1 or 2 times as needed. °GET HELP IF: °· You cough up more thick spit than usual. °· There is a change in the color or thickness of the spit. °· It is harder to breathe than usual. °· Your breathing is faster than usual. °GET HELP RIGHT AWAY IF: °· You have shortness of breath while resting. °· You have shortness of breath that stops you from: °¨ Being able to talk. °¨ Doing normal activities. °· You chest hurts for longer than 5 minutes. °· Your skin color is more blue than usual. °· Your pulse oximeter shows that you have low oxygen for longer than 5 minutes. °MAKE SURE YOU: °· Understand these instructions. °· Will watch your condition. °· Will get help right away if you are not doing well or get worse. °  °This information is not intended to replace advice given to you by your health care provider. Make sure you discuss any questions you have with your health care provider. °  °Document Released: 06/17/2007 Document Revised: 01/19/2014 Document Reviewed: 08/25/2012 °Elsevier Interactive Patient   Education ©2016 [employer] ° °

## 2015-03-08 NOTE — Telephone Encounter (Signed)
Rescheduled with Elease Hashimoto , per patient.

## 2015-03-09 NOTE — Discharge Summary (Signed)
[facility] [place] at [facility]   PATIENT NAME: Samantha Goodman    MR#:  [id number]  DATE OF BIRTH:  20-Jul-1935  DATE OF ADMISSION:  03/04/2015 ADMITTING PHYSICIAN: Adrian Saran, MD  DATE OF DISCHARGE: 03/06/2015  4:15 PM  PRIMARY CARE PHYSICIAN: Mila Merry, MD    ADMISSION DIAGNOSIS:  Chronic obstructive pulmonary disease, unspecified COPD type (HCC) [J44.9]  DISCHARGE DIAGNOSIS:  Active Problems:   COPD (chronic obstructive pulmonary disease) (HCC) Hypokalemia  SECONDARY DIAGNOSIS:   Past Medical History  Diagnosis Date  . COPD (chronic obstructive pulmonary disease) (HCC)   . Asthma   . CAD (coronary artery disease)   . Unstable angina pectoris (HCC)   . Hyperlipemia   . Agitation   . Fall   . Hypothyroid     HOSPITAL COURSE:  80 year female with a history of chronic respiratory failure on 2 L oxygen due to COPD who admitted for acute COPD exacerbation.  1. Acute on chronic respiratory failure: secondary to COPD exacerbation.  2. Acute COPD exacerbation: responded well to IV Solu-Medrol, nebulizer, theophylline, abx and inhalers.   She was feeling much better by 2/22 and was D/C home in stable condition. She was agreeable with D/C plans. DISCHARGE CONDITIONS:   STABLE  CONSULTS OBTAINED:  Treatment Team:  Yevonne Pax, MD  DRUG ALLERGIES:   Allergies  Allergen Reactions  . Codeine Nausea And Vomiting  . Fluticasone-Salmeterol Other (See Comments)    Reaction:  Unknown   . Levofloxacin Other (See Comments)    Reaction:  Confusion and hallucinations   . Tiotropium Bromide Monohydrate Rash    DISCHARGE MEDICATIONS:   Discharge Medication List as of 03/06/2015  3:33 PM    START taking these medications   Details  azithromycin (ZITHROMAX) 250 MG tablet Take 1 tablet (250 mg total) by mouth daily., Starting 03/06/2015, Until Discontinued, Normal    predniSONE (STERAPRED UNI-PAK 21 TAB) 10 MG (21) TBPK tablet Take 1 tablet (10  mg total) by mouth daily. Start 60 mg po daily, taper 10 mg daily until done, Starting 03/06/2015, Until Discontinued, Normal      CONTINUE these medications which have NOT CHANGED   Details  acetaminophen (TYLENOL) 500 MG tablet Take 500-1,000 mg by mouth every 6 (six) hours as needed for mild pain, fever or headache. , Until Discontinued, Historical Med    albuterol (PROVENTIL HFA;VENTOLIN HFA) 108 (90 Base) MCG/ACT inhaler Inhale 2 puffs into the lungs every 4 (four) hours as needed for wheezing or shortness of breath., Until Discontinued, Historical Med    aspirin EC 81 MG tablet Take 81 mg by mouth daily., Until Discontinued, Historical Med    atorvastatin (LIPITOR) 40 MG tablet Take 40 mg by mouth at bedtime., Until Discontinued, Historical Med    budesonide-formoterol (SYMBICORT) 160-4.5 MCG/ACT inhaler Inhale 2 puffs into the lungs 2 (two) times daily., Until Discontinued, Historical Med    cholecalciferol (VITAMIN D) 1000 UNITS tablet Take 1,000 Units by mouth daily., Until Discontinued, Historical Med    clopidogrel (PLAVIX) 75 MG tablet Take 75 mg by mouth at bedtime., Until Discontinued, Historical Med    ferrous sulfate 325 (65 FE) MG tablet Take 325 mg by mouth daily with breakfast., Until Discontinued, Historical Med    fluticasone (FLONASE) 50 MCG/ACT nasal spray Place 2 sprays into both nostrils daily as needed for rhinitis., Until Discontinued, Historical Med    furosemide (LASIX) 20 MG tablet Take 1 tablet (20 mg total) by mouth  daily., Starting 10/16/2014, Until Discontinued, Normal    guaiFENesin (MUCINEX) 600 MG 12 hr tablet Take 1 tablet (600 mg total) by mouth 2 (two) times daily., Starting 11/17/2014, Until Discontinued, Print    ipratropium-albuterol (DUONEB) 0.5-2.5 (3) MG/3ML SOLN Take 3 mLs by nebulization 4 (four) times daily as needed (for wheezing/shortness of breath). , Until Discontinued, Historical Med    levothyroxine (SYNTHROID, LEVOTHROID) 88 MCG tablet  Take 88 mcg by mouth daily before breakfast., Until Discontinued, Historical Med    LORazepam (ATIVAN) 0.5 MG tablet Take 0.5 mg by mouth every 6 (six) hours as needed for anxiety., Until Discontinued, Historical Med    metoprolol succinate (TOPROL-XL) 25 MG 24 hr tablet Take 12.5 mg by mouth daily., Until Discontinued, Historical Med    montelukast (SINGULAIR) 10 MG tablet Take 10 mg by mouth at bedtime., Until Discontinued, Historical Med    pantoprazole (PROTONIX) 40 MG tablet Take 40 mg by mouth daily., Until Discontinued, Historical Med    polyethylene glycol (MIRALAX / GLYCOLAX) packet Take 17 g by mouth daily., Until Discontinued, Historical Med    Potassium 99 MG TABS Take 99 mg by mouth daily. , Until Discontinued, Historical Med    theophylline (THEO-24) 200 MG 24 hr capsule Take 200 mg by mouth daily., Until Discontinued, Historical Med         DISCHARGE INSTRUCTIONS:    DIET:  Regular diet  DISCHARGE CONDITION:  Good  ACTIVITY:  Activity as tolerated  OXYGEN:  Home Oxygen: Yes.     Oxygen Delivery: 2 L via n.c. Which is chronic home O2 need  DISCHARGE LOCATION:  home   If you experience worsening of your admission symptoms, develop shortness of breath, life threatening emergency, suicidal or homicidal thoughts you must seek medical attention immediately by calling 911 or calling your MD immediately  if symptoms less severe.  You Must read complete instructions/literature along with all the possible adverse reactions/side effects for all the Medicines you take and that have been prescribed to you. Take any new Medicines after you have completely understood and accpet all the possible adverse reactions/side effects.   Please note  You were cared for by a hospitalist during your hospital stay. If you have any questions about your discharge medications or the care you received while you were in the hospital after you are discharged, you can call the unit and  asked to speak with the hospitalist on call if the hospitalist that took care of you is not available. Once you are discharged, your primary care physician will handle any further medical issues. Please note that NO REFILLS for any discharge medications will be authorized once you are discharged, as it is imperative that you return to your primary care physician (or establish a relationship with a primary care physician if you do not have one) for your aftercare needs so that they can reassess your need for medications and monitor your lab values.    On the day of Discharge:  VITAL SIGNS:  Blood pressure 115/58, pulse 77, temperature 98.4 F (36.9 C), temperature source Oral, resp. rate 18, height  (1.651 m), weight 66.225 kg (146 lb), SpO2 97 %.  PHYSICAL EXAMINATION:  GENERAL:  80 y.o.-year-old patient lying in the bed with no acute distress.  EYES: Pupils equal, round, reactive to light and accommodation. No scleral icterus. Extraocular muscles intact.  HEENT: Head atraumatic, normocephalic. Oropharynx and nasopharynx clear.  NECK:  Supple, no jugular venous distention. No thyroid enlargement, no tenderness.  LUNGS: Normal breath sounds bilaterally, no wheezing, rales,rhonchi or crepitation. No use of accessory muscles of respiration.  CARDIOVASCULAR: S1, S2 normal. No murmurs, rubs, or gallops.  ABDOMEN: Soft, non-tender, non-distended. Bowel sounds present. No organomegaly or mass.  EXTREMITIES: No pedal edema, cyanosis, or clubbing.  NEUROLOGIC: Cranial nerves II through XII are intact. Muscle strength 5/5 in all extremities. Sensation intact. Gait not checked.  PSYCHIATRIC: The patient is alert and oriented x 3.  SKIN: No obvious rash, lesion, or ulcer.  DATA REVIEW:   CBC  Recent Labs Lab 03/06/15 0808  WBC 14.1*  HGB 11.8*  HCT 36.1  PLT 391    Chemistries   Recent Labs Lab 03/06/15 0808  NA 145  K 3.9  CL 103  CO2 34*  GLUCOSE 137*  BUN 24*  CREATININE 0.82   CALCIUM 9.1  MG 2.3    Cardiac Enzymes  Recent Labs Lab 03/04/15 0938  TROPONINI 0.03    Microbiology Results  Results for orders placed or performed during the hospital encounter of 03/04/15  Rapid Influenza A&B Antigens ([facility] only)     Status: None   Collection Time: 03/04/15  9:38 AM  Result Value Ref Range Status   Influenza A [facility]) NOT DETECTED  Final   Influenza B [facility]) NOT DETECTED  Final    RADIOLOGY:  No results found.   Management plans discussed with the patient, family and they are in agreement.  CODE STATUS:  Code Status History    Date Active Date Inactive Code Status Order ID Comments User Context   03/04/2015  2:46 PM 03/06/2015  7:15 PM DNR [id number]  Adrian Saran, MD Inpatient   11/14/2014  8:02 PM 11/17/2014  4:44 PM Full Code [id number]  Auburn Bilberry, MD Inpatient    Questions for Most Recent Historical Code Status (Order [id number])    Question Answer Comment   In the event of cardiac or respiratory ARREST Do not call a "code blue"    In the event of cardiac or respiratory ARREST Do not perform Intubation, CPR, defibrillation or ACLS    In the event of cardiac or respiratory ARREST Use medication by any route, position, wound care, and other measures to relive pain and suffering. May use oxygen, suction and manual treatment of airway obstruction as needed for comfort.     Advance Directive Documentation        Most Recent Value   Type of Advance Directive  Advance instruction for mental health treatment   Pre-existing out of facility DNR order (yellow form or pink MOST form)     "MOST" Form in Place?        TOTAL TIME TAKING CARE OF THIS PATIENT: 45 minutes.    [facility], Darden Flemister M.D on 03/09/2015 at 7:53 AM  Between 7am to 6pm - Pager - [phone]  After 6pm go to [email or web address] - password [email or web address]  [facility]  Office  [phone]  CC: Primary care physician; Mila Merry, MD   Note: This dictation was prepared with Dragon  dictation along with smaller phrase technology. Any transcriptional errors that result from this process are unintentional.

## 2015-03-12 DIAGNOSIS — J449 Chronic obstructive pulmonary disease, unspecified: Secondary | ICD-10-CM | POA: Diagnosis not present

## 2015-03-13 ENCOUNTER — Inpatient Hospital Stay: Payer: Medicare Other | Admitting: Family Medicine

## 2015-03-13 ENCOUNTER — Encounter: Payer: Self-pay | Admitting: Family Medicine

## 2015-03-13 ENCOUNTER — Ambulatory Visit (INDEPENDENT_AMBULATORY_CARE_PROVIDER_SITE_OTHER): Payer: Medicare Other | Admitting: Family Medicine

## 2015-03-13 VITALS — BP 118/54 | HR 84 | Temp 97.4°F | Resp 32 | Wt 145.0 lb

## 2015-03-13 DIAGNOSIS — J441 Chronic obstructive pulmonary disease with (acute) exacerbation: Secondary | ICD-10-CM

## 2015-03-13 NOTE — Progress Notes (Signed)
Subjective:    Patient ID: Samantha Goodman, female    DOB: May 22, 1935, 80 y.o.   MRN: [id number]  HPI   Follow up Hospitalization  Patient was admitted to [facility] on 03/04/2015 and discharged on 03/06/2015. She was treated for COPD exacerbation. Treatment for this included Azithromycin and Prednisone 6 day taper. She reports excellent compliance with treatment. She reports this condition is Improved. Pt states she is 100% improved.  ------------------------------------------------------------------------------------    Review of Systems  Constitutional: Negative for fever, chills, diaphoresis, activity change, appetite change, fatigue and unexpected weight change.  Respiratory: Positive for cough (clear sputum) and wheezing. Negative for shortness of breath.   Cardiovascular: Negative for chest pain, palpitations and leg swelling.   BP 118/54 mmHg  Pulse 84  Temp(Src) 97.4 F (36.3 C) (Oral)  Resp 32  Wt 145 lb (65.772 kg)  SpO2 97%   Patient Active Problem List   Diagnosis Date Noted  . COPD (chronic obstructive pulmonary disease) (HCC) 11/21/2014  . Senile purpura (HCC) 11/21/2014  . Oxygen dependent 07/13/2014  . Acid reflux 07/09/2014  . Allergic rhinitis 07/06/2014  . Anxiety 07/06/2014  . Body mass index (BMI) of 23.0-23.9 in adult 07/06/2014  . CCF (congestive cardiac failure) (HCC) 07/06/2014  . Chronic constipation 07/06/2014  . Dizziness 07/06/2014  . Edema extremities 07/06/2014  . Bleeding gastrointestinal 07/06/2014  . Hypercholesteremia 07/06/2014  . Decreased potassium in the blood 07/06/2014  . Adult hypothyroidism 07/06/2014  . OP (osteoporosis) 07/06/2014  . Procidentia of rectum 07/06/2014  . Tobacco abuse, in remission 07/06/2014  . Anemia 07/04/2014  . Arteriosclerosis of coronary artery 03/09/2013   Past Medical History  Diagnosis Date  . COPD (chronic obstructive pulmonary disease) (HCC)   . Asthma   . CAD (coronary artery disease)   .  Unstable angina pectoris (HCC)   . Hyperlipemia   . Agitation   . Fall   . Hypothyroid    Current Outpatient Prescriptions on File Prior to Visit  Medication Sig  . acetaminophen (TYLENOL) 500 MG tablet Take 500-1,000 mg by mouth every 6 (six) hours as needed for mild pain, fever or headache.   . albuterol (PROVENTIL HFA;VENTOLIN HFA) 108 (90 Base) MCG/ACT inhaler Inhale 2 puffs into the lungs every 4 (four) hours as needed for wheezing or shortness of breath.  Marland Kitchen aspirin EC 81 MG tablet Take 81 mg by mouth daily.  Marland Kitchen atorvastatin (LIPITOR) 40 MG tablet Take 40 mg by mouth at bedtime.  . budesonide-formoterol (SYMBICORT) 160-4.5 MCG/ACT inhaler Inhale 2 puffs into the lungs 2 (two) times daily.  . cholecalciferol (VITAMIN D) 1000 UNITS tablet Take 1,000 Units by mouth daily.  . clopidogrel (PLAVIX) 75 MG tablet Take 75 mg by mouth at bedtime.  . ferrous sulfate 325 (65 FE) MG tablet Take 325 mg by mouth daily with breakfast.  . fluticasone (FLONASE) 50 MCG/ACT nasal spray Place 2 sprays into both nostrils daily as needed for rhinitis.  . furosemide (LASIX) 20 MG tablet Take 1 tablet (20 mg total) by mouth daily.  Marland Kitchen guaiFENesin (MUCINEX) 600 MG 12 hr tablet Take 1 tablet (600 mg total) by mouth 2 (two) times daily.  Marland Kitchen ipratropium-albuterol (DUONEB) 0.5-2.5 (3) MG/3ML SOLN Take 3 mLs by nebulization 4 (four) times daily as needed (for wheezing/shortness of breath).   Marland Kitchen levothyroxine (SYNTHROID, LEVOTHROID) 88 MCG tablet Take 88 mcg by mouth daily before breakfast.  . LORazepam (ATIVAN) 0.5 MG tablet Take 0.5 mg by mouth every 6 (six) hours  as needed for anxiety.  . metoprolol succinate (TOPROL-XL) 25 MG 24 hr tablet Take 12.5 mg by mouth daily.  . montelukast (SINGULAIR) 10 MG tablet Take 10 mg by mouth at bedtime.  . pantoprazole (PROTONIX) 40 MG tablet Take 40 mg by mouth daily.  . polyethylene glycol (MIRALAX / GLYCOLAX) packet Take 17 g by mouth daily.  . Potassium 99 MG TABS Take 99 mg by  mouth daily.   . theophylline (THEO-24) 200 MG 24 hr capsule Take 200 mg by mouth daily.   No current facility-administered medications on file prior to visit.   Allergies  Allergen Reactions  . Codeine Nausea And Vomiting  . Fluticasone-Salmeterol Other (See Comments)    Reaction:  Unknown   . Levofloxacin Other (See Comments)    Reaction:  Confusion and hallucinations   . Tiotropium Bromide Monohydrate Rash   Past Surgical History  Procedure Laterality Date  . Eye surgery    . Abdominal hysterectomy    . Appendectomy    . Ankle surgery Right    Social History   Social History  . Marital Status: Widowed    Spouse Name: N/A  . Number of Children: N/A  . Years of Education: N/A   Occupational History  . Not on file.   Social History Main Topics  . Smoking status: Former Smoker -- 0.25 packs/day for 65 years    Quit date: 11/11/2013  . Smokeless tobacco: Never Used  . Alcohol Use: No  . Drug Use: No  . Sexual Activity: Not on file   Other Topics Concern  . Not on file   Social History Narrative   Family History  Problem Relation Age of Onset  . Stroke Mother   . Prostate cancer Father   . CAD Brother   . Ovarian cancer Sister   . Breast cancer Sister   . Lung cancer Sister        Objective:   Physical Exam  Constitutional: She appears well-developed and well-nourished.  Pulmonary/Chest: Effort normal.  Course breath sounds  Psychiatric: She has a normal mood and affect. Her behavior is normal.  BP 118/54 mmHg  Pulse 84  Temp(Src) 97.4 F (36.3 C) (Oral)  Resp 32  Wt 145 lb (65.772 kg)  SpO2 97%     Assessment & Plan:  1. COPD exacerbation (HCC) Resolved. Pt doing really well today. Agree that pt does not need Home Health at this time. FU in 3 months.  Patient seen and examined by Leo Grosser, MD, and note scribed by Allene Dillon, CMA.   I have reviewed the document for accuracy and completeness and I agree with above. Leo Grosser, MD   Lorie Phenix, MD

## 2015-03-18 DIAGNOSIS — J449 Chronic obstructive pulmonary disease, unspecified: Secondary | ICD-10-CM | POA: Diagnosis not present

## 2015-03-18 DIAGNOSIS — J9621 Acute and chronic respiratory failure with hypoxia: Secondary | ICD-10-CM | POA: Diagnosis not present

## 2015-03-27 ENCOUNTER — Other Ambulatory Visit: Payer: Self-pay | Admitting: Family Medicine

## 2015-03-27 DIAGNOSIS — D649 Anemia, unspecified: Secondary | ICD-10-CM

## 2015-03-30 DIAGNOSIS — R0902 Hypoxemia: Secondary | ICD-10-CM | POA: Diagnosis not present

## 2015-03-30 DIAGNOSIS — J9690 Respiratory failure, unspecified, unspecified whether with hypoxia or hypercapnia: Secondary | ICD-10-CM | POA: Diagnosis not present

## 2015-03-30 DIAGNOSIS — J209 Acute bronchitis, unspecified: Secondary | ICD-10-CM | POA: Diagnosis not present

## 2015-04-06 ENCOUNTER — Other Ambulatory Visit: Payer: Self-pay | Admitting: Family Medicine

## 2015-04-06 DIAGNOSIS — J410 Simple chronic bronchitis: Secondary | ICD-10-CM

## 2015-04-10 DIAGNOSIS — J449 Chronic obstructive pulmonary disease, unspecified: Secondary | ICD-10-CM | POA: Diagnosis not present

## 2015-04-13 ENCOUNTER — Other Ambulatory Visit: Payer: Self-pay | Admitting: Family Medicine

## 2015-04-13 DIAGNOSIS — J410 Simple chronic bronchitis: Secondary | ICD-10-CM

## 2015-04-17 DIAGNOSIS — R0602 Shortness of breath: Secondary | ICD-10-CM | POA: Diagnosis not present

## 2015-04-25 ENCOUNTER — Inpatient Hospital Stay
Admission: EM | Admit: 2015-04-25 | Discharge: 2015-04-27 | DRG: 379 | Disposition: A | Discharge status: Home / Self Care | Payer: Medicare Other | Attending: Internal Medicine | Admitting: Internal Medicine

## 2015-04-25 DIAGNOSIS — I10 Essential (primary) hypertension: Secondary | ICD-10-CM | POA: Diagnosis not present

## 2015-04-25 DIAGNOSIS — K625 Hemorrhage of anus and rectum: Secondary | ICD-10-CM

## 2015-04-25 DIAGNOSIS — E785 Hyperlipidemia, unspecified: Secondary | ICD-10-CM | POA: Diagnosis present

## 2015-04-25 DIAGNOSIS — Z8249 Family history of ischemic heart disease and other diseases of the circulatory system: Secondary | ICD-10-CM

## 2015-04-25 DIAGNOSIS — F419 Anxiety disorder, unspecified: Secondary | ICD-10-CM | POA: Diagnosis present

## 2015-04-25 DIAGNOSIS — I2510 Atherosclerotic heart disease of native coronary artery without angina pectoris: Secondary | ICD-10-CM | POA: Diagnosis not present

## 2015-04-25 DIAGNOSIS — I509 Heart failure, unspecified: Secondary | ICD-10-CM | POA: Diagnosis present

## 2015-04-25 DIAGNOSIS — Z888 Allergy status to other drugs, medicaments and biological substances status: Secondary | ICD-10-CM | POA: Diagnosis not present

## 2015-04-25 DIAGNOSIS — I110 Hypertensive heart disease with heart failure: Secondary | ICD-10-CM | POA: Diagnosis present

## 2015-04-25 DIAGNOSIS — Z885 Allergy status to narcotic agent status: Secondary | ICD-10-CM | POA: Diagnosis not present

## 2015-04-25 DIAGNOSIS — K921 Melena: Secondary | ICD-10-CM | POA: Diagnosis not present

## 2015-04-25 DIAGNOSIS — Z7902 Long term (current) use of antithrombotics/antiplatelets: Secondary | ICD-10-CM | POA: Diagnosis not present

## 2015-04-25 DIAGNOSIS — J449 Chronic obstructive pulmonary disease, unspecified: Secondary | ICD-10-CM | POA: Diagnosis present

## 2015-04-25 DIAGNOSIS — K219 Gastro-esophageal reflux disease without esophagitis: Secondary | ICD-10-CM | POA: Diagnosis present

## 2015-04-25 DIAGNOSIS — F17210 Nicotine dependence, cigarettes, uncomplicated: Secondary | ICD-10-CM | POA: Diagnosis present

## 2015-04-25 DIAGNOSIS — Z881 Allergy status to other antibiotic agents status: Secondary | ICD-10-CM | POA: Diagnosis not present

## 2015-04-25 DIAGNOSIS — K5731 Diverticulosis of large intestine without perforation or abscess with bleeding: Secondary | ICD-10-CM | POA: Diagnosis not present

## 2015-04-25 DIAGNOSIS — D649 Anemia, unspecified: Secondary | ICD-10-CM | POA: Diagnosis present

## 2015-04-25 DIAGNOSIS — Z7982 Long term (current) use of aspirin: Secondary | ICD-10-CM | POA: Diagnosis not present

## 2015-04-25 DIAGNOSIS — K922 Gastrointestinal hemorrhage, unspecified: Secondary | ICD-10-CM

## 2015-04-25 DIAGNOSIS — Z955 Presence of coronary angioplasty implant and graft: Secondary | ICD-10-CM | POA: Diagnosis not present

## 2015-04-25 DIAGNOSIS — Z9981 Dependence on supplemental oxygen: Secondary | ICD-10-CM | POA: Diagnosis not present

## 2015-04-25 DIAGNOSIS — E039 Hypothyroidism, unspecified: Secondary | ICD-10-CM | POA: Diagnosis not present

## 2015-04-25 DIAGNOSIS — J45909 Unspecified asthma, uncomplicated: Secondary | ICD-10-CM | POA: Diagnosis present

## 2015-04-25 LAB — COMPREHENSIVE METABOLIC PANEL
ALBUMIN: 3.8 g/dL (ref 3.5–5.0)
ALK PHOS: 71 U/L (ref 38–126)
ALT: 12 U/L — ABNORMAL LOW (ref 14–54)
ANION GAP: 9 (ref 5–15)
AST: 23 U/L (ref 15–41)
BUN: 17 mg/dL (ref 6–20)
CALCIUM: 8.8 mg/dL — AB (ref 8.9–10.3)
CO2: 26 mmol/L (ref 22–32)
Chloride: 101 mmol/L (ref 101–111)
Creatinine, Ser: 0.84 mg/dL (ref 0.44–1.00)
GFR calc non Af Amer: >60 mL/min (ref >60)
GLUCOSE: 152 mg/dL — AB (ref 65–99)
POTASSIUM: 3.2 mmol/L — AB (ref 3.5–5.1)
SODIUM: 136 mmol/L (ref 135–145)
TOTAL PROTEIN: 6.3 g/dL — AB (ref 6.5–8.1)
Total Bilirubin: 0.5 mg/dL (ref 0.3–1.2)

## 2015-04-25 LAB — CBC
HEMATOCRIT: 30.2 % — AB (ref 35.0–47.0)
HEMOGLOBIN: 10.3 g/dL — AB (ref 12.0–16.0)
MCH: 27.5 pg (ref 26.0–34.0)
MCHC: 34 g/dL (ref 32.0–36.0)
MCV: 80.8 fL (ref 80.0–100.0)
Platelets: 410 10*3/uL (ref 150–440)
RBC: 3.73 MIL/uL — AB (ref 3.80–5.20)
RDW: 14.9 % — ABNORMAL HIGH (ref 11.5–14.5)
WBC: 11.7 10*3/uL — ABNORMAL HIGH (ref 3.6–11.0)

## 2015-04-25 LAB — HEMOGLOBIN: Hemoglobin: 8 g/dL — ABNORMAL LOW (ref 12.0–16.0)

## 2015-04-25 LAB — PROTIME-INR
INR: 1
Prothrombin Time: 13.4 seconds (ref 11.4–15.0)

## 2015-04-25 LAB — APTT: APTT: 24 s (ref 24–36)

## 2015-04-25 LAB — ABO/RH: ABO/RH(D): B POS

## 2015-04-25 MED ORDER — METOPROLOL SUCCINATE ER 25 MG PO TB24
12.5000 mg | ORAL_TABLET | Freq: Every day | ORAL | Status: DC
  Administered 2015-04-26 – 2015-04-27 (×2): 12.5 mg via ORAL
  Filled 2015-04-25 (×2): qty 1

## 2015-04-25 MED ORDER — ONDANSETRON HCL 4 MG PO TABS: 4.0000 mg | ORAL_TABLET | Freq: Four times a day (QID) | ORAL | Status: DC | PRN

## 2015-04-25 MED ORDER — VITAMIN D 1000 UNITS PO TABS
1000.0000 [IU] | ORAL_TABLET | Freq: Every day | ORAL | Status: DC
  Administered 2015-04-26 – 2015-04-27 (×2): 1000 [IU] via ORAL
  Filled 2015-04-25 (×2): qty 1

## 2015-04-25 MED ORDER — PANTOPRAZOLE SODIUM 40 MG IV SOLR
40.0000 mg | Freq: Once | INTRAVENOUS | Status: AC
  Administered 2015-04-25: 40 mg via INTRAVENOUS
  Filled 2015-04-25: qty 40

## 2015-04-25 MED ORDER — PANTOPRAZOLE SODIUM 40 MG PO TBEC
40.0000 mg | DELAYED_RELEASE_TABLET | Freq: Every day | ORAL | Status: DC
  Administered 2015-04-26 – 2015-04-27 (×2): 40 mg via ORAL
  Filled 2015-04-25 (×2): qty 1

## 2015-04-25 MED ORDER — SODIUM CHLORIDE 0.9 % IV BOLUS (SEPSIS)
1000.0000 mL | Freq: Once | INTRAVENOUS | Status: AC
  Administered 2015-04-25: 1000 mL via INTRAVENOUS

## 2015-04-25 MED ORDER — ACETAMINOPHEN 325 MG PO TABS
650.0000 mg | ORAL_TABLET | Freq: Four times a day (QID) | ORAL | Status: DC | PRN
Start: 2015-04-25 — End: 2015-04-27

## 2015-04-25 MED ORDER — ATORVASTATIN CALCIUM 20 MG PO TABS
40.0000 mg | ORAL_TABLET | Freq: Every day | ORAL | Status: DC
  Administered 2015-04-25 – 2015-04-26 (×2): 40 mg via ORAL
  Filled 2015-04-25 (×2): qty 2

## 2015-04-25 MED ORDER — ACETAMINOPHEN 500 MG PO TABS: 500.0000 mg | ORAL_TABLET | Freq: Four times a day (QID) | ORAL | Status: DC | PRN

## 2015-04-25 MED ORDER — THEOPHYLLINE ER 200 MG PO CP24
200.0000 mg | ORAL_CAPSULE | Freq: Every day | ORAL | Status: DC
  Administered 2015-04-26 – 2015-04-27 (×2): 200 mg via ORAL
  Filled 2015-04-25 (×3): qty 1

## 2015-04-25 MED ORDER — SODIUM CHLORIDE 0.9 % IV SOLN
INTRAVENOUS | Status: DC
  Administered 2015-04-26 – 2015-04-27 (×2): via INTRAVENOUS

## 2015-04-25 MED ORDER — DOCUSATE SODIUM 100 MG PO CAPS
200.0000 mg | ORAL_CAPSULE | Freq: Every day | ORAL | Status: DC
  Administered 2015-04-26 – 2015-04-27 (×2): 200 mg via ORAL
  Filled 2015-04-25 (×2): qty 2

## 2015-04-25 MED ORDER — LEVOTHYROXINE SODIUM 88 MCG PO TABS
88.0000 ug | ORAL_TABLET | Freq: Every day | ORAL | Status: DC
Start: 2015-04-26 — End: 2015-04-27
  Administered 2015-04-26 – 2015-04-27 (×2): 88 ug via ORAL
  Filled 2015-04-25 (×2): qty 1

## 2015-04-25 MED ORDER — ALBUTEROL SULFATE (2.5 MG/3ML) 0.083% IN NEBU
2.5000 mg | INHALATION_SOLUTION | RESPIRATORY_TRACT | Status: DC | PRN
  Administered 2015-04-26: 12:00:00 2.5 mg via RESPIRATORY_TRACT
  Filled 2015-04-25: qty 3

## 2015-04-25 MED ORDER — FUROSEMIDE 20 MG PO TABS
20.0000 mg | ORAL_TABLET | Freq: Every day | ORAL | Status: DC
  Administered 2015-04-26 – 2015-04-27 (×2): 20 mg via ORAL
  Filled 2015-04-25 (×2): qty 1

## 2015-04-25 MED ORDER — LORAZEPAM 0.5 MG PO TABS
0.5000 mg | ORAL_TABLET | Freq: Four times a day (QID) | ORAL | Status: DC | PRN
Start: 2015-04-25 — End: 2015-04-27

## 2015-04-25 MED ORDER — FERROUS SULFATE 325 (65 FE) MG PO TABS
325.0000 mg | ORAL_TABLET | Freq: Every evening | ORAL | Status: DC
  Administered 2015-04-26: 17:00:00 325 mg via ORAL
  Filled 2015-04-25 (×2): qty 1

## 2015-04-25 MED ORDER — ACETAMINOPHEN 650 MG RE SUPP: 650.0000 mg | Freq: Four times a day (QID) | RECTAL | Status: DC | PRN

## 2015-04-25 MED ORDER — SODIUM CHLORIDE 0.9 % IV SOLN
Freq: Once | INTRAVENOUS | Status: AC
  Administered 2015-04-25: 1000 mL via INTRAVENOUS

## 2015-04-25 MED ORDER — MOMETASONE FURO-FORMOTEROL FUM 100-5 MCG/ACT IN AERO
2.0000 | INHALATION_SPRAY | Freq: Two times a day (BID) | RESPIRATORY_TRACT | Status: DC
  Administered 2015-04-25 – 2015-04-27 (×4): 2 via RESPIRATORY_TRACT
  Filled 2015-04-25: qty 8.8

## 2015-04-25 MED ORDER — FLUTICASONE PROPIONATE 50 MCG/ACT NA SUSP
2.0000 | Freq: Every day | NASAL | Status: DC | PRN
  Filled 2015-04-25: qty 16

## 2015-04-25 MED ORDER — IPRATROPIUM-ALBUTEROL 0.5-2.5 (3) MG/3ML IN SOLN: 3.0000 mL | Freq: Four times a day (QID) | RESPIRATORY_TRACT | Status: DC | PRN

## 2015-04-25 MED ORDER — ONDANSETRON HCL 4 MG/2ML IJ SOLN: 4.0000 mg | Freq: Four times a day (QID) | INTRAMUSCULAR | Status: DC | PRN

## 2015-04-25 MED ORDER — GUAIFENESIN ER 600 MG PO TB12
600.0000 mg | ORAL_TABLET | Freq: Two times a day (BID) | ORAL | Status: DC
  Administered 2015-04-25 – 2015-04-27 (×4): 600 mg via ORAL
  Filled 2015-04-25 (×4): qty 1

## 2015-04-25 MED ORDER — POTASSIUM CHLORIDE CRYS ER 10 MEQ PO TBCR
10.0000 meq | EXTENDED_RELEASE_TABLET | Freq: Every day | ORAL | Status: DC
  Administered 2015-04-26 – 2015-04-27 (×2): 10 meq via ORAL
  Filled 2015-04-25 (×2): qty 1

## 2015-04-25 MED ORDER — MONTELUKAST SODIUM 10 MG PO TABS
10.0000 mg | ORAL_TABLET | Freq: Every day | ORAL | Status: DC
  Administered 2015-04-25 – 2015-04-26 (×2): 10 mg via ORAL
  Filled 2015-04-25 (×2): qty 1

## 2015-04-25 NOTE — Progress Notes (Signed)
Patient had 2 medium burgundy-colored bleed with bowel movement . Hemoglobin is down to 8. She is asymptomatic. Does not seem to have bright red blood per rectum. Appears old blood. We'll continue to monitor hemoglobin and vital signs. If patient has right her bleeding will order nuclear GI bleed scan. IV fluids have been ordered. We'll transfuse. It drops less than 7. And or if blood pressure drops.

## 2015-04-25 NOTE — ED Notes (Signed)
Pt c/o 2 bright red bloody stools in the past , denies pain, states she is on ASA and plavix, denies any recent black stood.. Pt assisted to the BR to have another BM during triage.the patient denies dizziness at present./

## 2015-04-25 NOTE — H&P (Signed)
[employer] -  at [facility]   PATIENT NAME: Samantha Goodman    MR#:  [id number]  DATE OF BIRTH:  [birth year]  DATE OF ADMISSION:  04/25/2015  PRIMARY CARE PHYSICIAN: Mila Merry, MD   REQUESTING/REFERRING PHYSICIAN: Dr. Phineas Semen  CHIEF COMPLAINT:   Chief Complaint  Patient presents with  . GI Bleeding    HISTORY OF PRESENT ILLNESS:  Samantha Goodman  is a 80 y.o. female with a known history of COPD, coronary artery disease, hypertension, hyperlipidemia, hypothyroidism, anxiety, GERD who presents to the hospital due to rectal bleeding. Patient says she was in a usual state of health when she developed rectal bleeding this afternoon. She denies any abdominal pain, nausea vomiting associated with her rectal bleeding. Patient says that she's had about 4 episodes of rectal bleeding today and therefore came to the ER further evaluation. She had 2 episodes at home and had 2 further episodes here in the emergency room. She has never had these symptoms before. She denies any chest pain, shortness of breath, nausea, vomiting or any other associated symptoms. Hospitalist services were contacted further treatment and evaluation.  PAST MEDICAL HISTORY:   Past Medical History  Diagnosis Date  . COPD (chronic obstructive pulmonary disease) (HCC)   . Asthma   . CAD (coronary artery disease)   . Unstable angina pectoris (HCC)   . Hyperlipemia   . Agitation   . Fall   . Hypothyroid     PAST SURGICAL HISTORY:   Past Surgical History  Procedure Laterality Date  . Eye surgery    . Abdominal hysterectomy    . Appendectomy    . Ankle surgery Right     SOCIAL HISTORY:   Social History  Substance Use Topics  . Smoking status: Former Smoker -- 0.25 packs/day for 65 years    Quit date: 11/11/2013  . Smokeless tobacco: Never Used  . Alcohol Use: No    FAMILY HISTORY:   Family History  Problem Relation Age of Onset  . Stroke Mother   . Prostate cancer Father   .  CAD Brother   . Ovarian cancer Sister   . Breast cancer Sister   . Lung cancer Sister     DRUG ALLERGIES:   Allergies  Allergen Reactions  . Codeine Nausea And Vomiting  . Fluticasone-Salmeterol Other (See Comments)    Reaction:  Unknown   . Levofloxacin Other (See Comments)    Reaction:  Confusion and hallucinations   . Tiotropium Bromide Monohydrate Rash    REVIEW OF SYSTEMS:   Review of Systems  Constitutional: Negative for fever and weight loss.  HENT: Negative for congestion, nosebleeds and tinnitus.   Eyes: Negative for blurred vision, double vision and redness.  Respiratory: Negative for cough, hemoptysis and shortness of breath.   Cardiovascular: Negative for chest pain, orthopnea, leg swelling and PND.  Gastrointestinal: Positive for blood in stool. Negative for nausea, vomiting, abdominal pain, diarrhea and melena.  Genitourinary: Negative for dysuria, urgency and hematuria.  Musculoskeletal: Negative for joint pain and falls.  Neurological: Negative for dizziness, tingling, sensory change, focal weakness, seizures, weakness and headaches.  Endo/Heme/Allergies: Negative for polydipsia. Does not bruise/bleed easily.  Psychiatric/Behavioral: Negative for depression and memory loss. The patient is not nervous/anxious.     MEDICATIONS AT HOME:   Prior to Admission medications   Medication Sig Start Date End Date Taking? Authorizing Provider  acetaminophen (TYLENOL) 500 MG tablet Take 500-1,000 mg by mouth every 6 (six) hours as needed  for mild pain, fever or headache.    Yes Historical Provider, MD  aspirin EC 81 MG tablet Take 81 mg by mouth daily.   Yes Historical Provider, MD  atorvastatin (LIPITOR) 40 MG tablet Take 40 mg by mouth at bedtime.   Yes Historical Provider, MD  cholecalciferol (VITAMIN D) 1000 UNITS tablet Take 1,000 Units by mouth daily.   Yes Historical Provider, MD  clopidogrel (PLAVIX) 75 MG tablet Take 75 mg by mouth at bedtime.   Yes Historical  Provider, MD  docusate sodium (COLACE) 250 MG capsule Take 250 mg by mouth daily.   Yes Historical Provider, MD  ferrous sulfate 325 (65 FE) MG tablet take 1 tablet by mouth every evening 03/27/15  Yes Lorie Phenix, MD  fluticasone [facility]) 50 MCG/ACT nasal spray Place 2 sprays into both nostrils daily as needed for rhinitis.   Yes Historical Provider, MD  furosemide (LASIX) 20 MG tablet Take 1 tablet (20 mg total) by mouth daily. 10/16/14  Yes Lorie Phenix, MD  guaiFENesin (MUCINEX) 600 MG 12 hr tablet Take 1 tablet (600 mg total) by mouth 2 (two) times daily. 11/17/14  Yes Gale Journey, MD  ipratropium-albuterol (DUONEB) 0.5-2.5 (3) MG/3ML SOLN Take 3 mLs by nebulization 4 (four) times daily as needed (for wheezing/shortness of breath).    Yes Historical Provider, MD  levothyroxine (SYNTHROID, LEVOTHROID) 88 MCG tablet Take 88 mcg by mouth daily before breakfast.   Yes Historical Provider, MD  LORazepam (ATIVAN) 0.5 MG tablet Take 0.5 mg by mouth every 6 (six) hours as needed for anxiety.   Yes Historical Provider, MD  metoprolol succinate (TOPROL-XL) 25 MG 24 hr tablet Take 12.5 mg by mouth daily.   Yes Historical Provider, MD  montelukast (SINGULAIR) 10 MG tablet Take 10 mg by mouth at bedtime.   Yes Historical Provider, MD  pantoprazole (PROTONIX) 40 MG tablet Take 40 mg by mouth daily.   Yes Historical Provider, MD  polyethylene glycol (MIRALAX / GLYCOLAX) packet Take 17 g by mouth daily.   Yes Historical Provider, MD  Potassium 99 MG TABS Take 99 mg by mouth daily.    Yes Historical Provider, MD  PROAIR HFA 108 [id number] Base) MCG/ACT inhaler inhale 2 puffs by mouth every 4 hours if needed for shortness of breath or wheezing 04/15/15  Yes Lorie Phenix, MD  [facility] 80-4.5 MCG/ACT inhaler inhale 2 puffs by mouth twice a day 04/08/15  Yes Lorie Phenix, MD  theophylline (THEO-24) 200 MG 24 hr capsule Take 200 mg by mouth daily.   Yes Historical Provider, MD      VITAL SIGNS:  Blood pressure  123/65, pulse 85, temperature 97.6 F (36.4 C), temperature source Oral, resp. rate 29, height 5' (1.524 m), weight 60.328 kg (133 lb), SpO2 98 %.  PHYSICAL EXAMINATION:  Physical Exam  GENERAL:  80 y.o.-year-old patient lying in the bed with no acute distress.  EYES: Pupils equal, round, reactive to light and accommodation. No scleral icterus. Extraocular muscles intact.  HEENT: Head atraumatic, normocephalic. Oropharynx and nasopharynx clear. No oropharyngeal erythema, moist oral mucosa  NECK:  Supple, no jugular venous distention. No thyroid enlargement, no tenderness.  LUNGS: Prolonged inspiratory and expiratory phase, no wheezing, rales, rhonchi. No use of accessory muscles of respiration.  CARDIOVASCULAR: S1, S2 RRR. No murmurs, rubs, gallops, clicks.  ABDOMEN: Soft, nontender, nondistended. Bowel sounds present. No organomegaly or mass.  EXTREMITIES: No pedal edema, cyanosis, or clubbing. + 2 pedal & radial pulses b/l.   NEUROLOGIC: Cranial  nerves II through XII are intact. No focal Motor or sensory deficits appreciated b/l PSYCHIATRIC: The patient is alert and oriented x 3. Good affect.  SKIN: No obvious rash, lesion, or ulcer.   LABORATORY PANEL:   CBC  Recent Labs Lab 04/25/15 1706  WBC 11.7*  HGB 10.3*  HCT 30.2*  PLT 410   ------------------------------------------------------------------------------------------------------------------  Chemistries   Recent Labs Lab 04/25/15 1706  NA 136  K 3.2*  CL 101  CO2 26  GLUCOSE 152*  BUN 17  CREATININE 0.84  CALCIUM 8.8*  AST 23  ALT 12*  ALKPHOS 71  BILITOT 0.5   ------------------------------------------------------------------------------------------------------------------  Cardiac Enzymes No results for input(s): TROPONINI in the last 168 hours. ------------------------------------------------------------------------------------------------------------------  RADIOLOGY:  No results  found.   IMPRESSION AND PLAN:   80 year old female with past medical history of COPD, hypertension, hyperlipidemia, GERD, anxiety, she of coronary artery disease, hypothyroidism who presents to the hospital due to multiple episodes of rectal bleeding.  1. GI bleed-this is a lower GI bleed given the patient's rectal bleeding. -Hemoglobin is currently stable and patient is hemodynamically stable. I suspect this is likely a diverticular bleed. -I will hold the patient's aspirin and Plavix. Follow serial hemoglobin. Get a gastroenterology consult. -Placed patient on clear liquid diet.  2. COPD-no acute exacerbation. -Continue Dulera, DuoNeb nebs as needed, albuterol inhaler, theophylline.  3. GERD-continue Protonix.  4. Essential hypertension-continue metoprolol.  5. Hypothyroidism-continue Synthroid.  6. Hyperlipidemia-continue atorvastatin.  7. History of coronary artery disease-no acute chest pain presently. -Continue aspirin, statin, beta blocker.    All the records are reviewed and case discussed with ED provider. Management plans discussed with the patient, family and they are in agreement.  CODE STATUS: Full  TOTAL TIME TAKING CARE OF THIS PATIENT: 45 minutes.    Houston SirenSAINANI,Sontee Desena J M.D on 04/25/2015 at 8:08 PM  Between 7am to 6pm - Pager - [phone]  After 6pm go to [email or web address] - password [email or web address] [facility]  [facility]  Office  [phone]  CC: Primary care physician; Mila Merryonald Fisher, MD

## 2015-04-25 NOTE — ED Provider Notes (Signed)
[facility] Emergency Department Provider Note    ____________________________________________  Time seen: ~1720  I have reviewed the triage vital signs and the nursing notes.   HISTORY  Chief Complaint GI Bleeding   History limited by: Not Limited   HPI Samantha Goodman is a 81 y.o. female who presents to the emergency department today because of concerns for bloody stool. Patient states that this started maybe 30 minutes to an hour before arrival. She has now had 3 bloody bowel movements. She describes as being bright red. She denies any pain associated with this. She denies similar symptoms in the past. She states she is on Plavix and aspirin for a coronary stent. She states she had a colonoscopy done a long time ago but does not recall any abnormal findings. She denies any fevers. Denies any chest pain or shortness breath.    Past Medical History  Diagnosis Date  . COPD (chronic obstructive pulmonary disease) (HCC)   . Asthma   . CAD (coronary artery disease)   . Unstable angina pectoris (HCC)   . Hyperlipemia   . Agitation   . Fall   . Hypothyroid     Patient Active Problem List   Diagnosis Date Noted  . COPD (chronic obstructive pulmonary disease) (HCC) 11/21/2014  . Senile purpura (HCC) 11/21/2014  . Oxygen dependent 07/13/2014  . Acid reflux 07/09/2014  . Allergic rhinitis 07/06/2014  . Anxiety 07/06/2014  . Body mass index (BMI) of 23.0-23.9 in adult 07/06/2014  . CCF (congestive cardiac failure) (HCC) 07/06/2014  . Chronic constipation 07/06/2014  . Dizziness 07/06/2014  . Edema extremities 07/06/2014  . Bleeding gastrointestinal 07/06/2014  . Hypercholesteremia 07/06/2014  . Decreased potassium in the blood 07/06/2014  . Adult hypothyroidism 07/06/2014  . OP (osteoporosis) 07/06/2014  . Procidentia of rectum 07/06/2014  . Tobacco abuse, in remission 07/06/2014  . Anemia 07/04/2014  . Arteriosclerosis of coronary artery  03/09/2013    Past Surgical History  Procedure Laterality Date  . Eye surgery    . Abdominal hysterectomy    . Appendectomy    . Ankle surgery Right     Current Outpatient Rx  Name  Route  Sig  Dispense  Refill  . acetaminophen (TYLENOL) 500 MG tablet   Oral   Take 500-1,000 mg by mouth every 6 (six) hours as needed for mild pain, fever or headache.          . albuterol (PROVENTIL HFA;VENTOLIN HFA) 108 (90 Base) MCG/ACT inhaler   Inhalation   Inhale 2 puffs into the lungs every 4 (four) hours as needed for wheezing or shortness of breath.         Marland Kitchen aspirin EC 81 MG tablet   Oral   Take 81 mg by mouth daily.         Marland Kitchen atorvastatin (LIPITOR) 40 MG tablet   Oral   Take 40 mg by mouth at bedtime.         . budesonide-formoterol (SYMBICORT) 160-4.5 MCG/ACT inhaler   Inhalation   Inhale 2 puffs into the lungs 2 (two) times daily.         . cholecalciferol (VITAMIN D) 1000 UNITS tablet   Oral   Take 1,000 Units by mouth daily.         . clopidogrel (PLAVIX) 75 MG tablet   Oral   Take 75 mg by mouth at bedtime.         . docusate sodium (COLACE) 250 MG capsule  Oral   Take 250 mg by mouth daily.         . ferrous sulfate 325 (65 FE) MG tablet   Oral   Take 325 mg by mouth daily with breakfast.         . ferrous sulfate 325 (65 FE) MG tablet      take 1 tablet by mouth every evening   90 tablet   1   . fluticasone (FLONASE) 50 MCG/ACT nasal spray   Each Nare   Place 2 sprays into both nostrils daily as needed for rhinitis.         . furosemide (LASIX) 20 MG tablet   Oral   Take 1 tablet (20 mg total) by mouth daily.   90 tablet   3   . guaiFENesin (MUCINEX) 600 MG 12 hr tablet   Oral   Take 1 tablet (600 mg total) by mouth 2 (two) times daily.   20 tablet   0   . ipratropium-albuterol (DUONEB) 0.5-2.5 (3) MG/3ML SOLN   Nebulization   Take 3 mLs by nebulization 4 (four) times daily as needed (for wheezing/shortness of breath).           Marland Kitchen levothyroxine (SYNTHROID, LEVOTHROID) 88 MCG tablet   Oral   Take 88 mcg by mouth daily before breakfast.         . LORazepam (ATIVAN) 0.5 MG tablet   Oral   Take 0.5 mg by mouth every 6 (six) hours as needed for anxiety.         . metoprolol succinate (TOPROL-XL) 25 MG 24 hr tablet   Oral   Take 12.5 mg by mouth daily.         . montelukast (SINGULAIR) 10 MG tablet   Oral   Take 10 mg by mouth at bedtime.         . pantoprazole (PROTONIX) 40 MG tablet   Oral   Take 40 mg by mouth daily.         . polyethylene glycol (MIRALAX / GLYCOLAX) packet   Oral   Take 17 g by mouth daily.         . Potassium 99 MG TABS   Oral   Take 99 mg by mouth daily.          Marland Kitchen PROAIR HFA 108 (90 Base) MCG/ACT inhaler      inhale 2 puffs by mouth every 4 hours if needed for shortness of breath or wheezing   1 Inhaler   5   . SYMBICORT 80-4.5 MCG/ACT inhaler      inhale 2 puffs by mouth twice a day   10.2 Inhaler   5     Manufacturer Coupon Available.   . theophylline (THEO-24) 200 MG 24 hr capsule   Oral   Take 200 mg by mouth daily.           Allergies Codeine; Fluticasone-salmeterol; Levofloxacin; and Tiotropium bromide monohydrate  Family History  Problem Relation Age of Onset  . Stroke Mother   . Prostate cancer Father   . CAD Brother   . Ovarian cancer Sister   . Breast cancer Sister   . Lung cancer Sister     Social History Social History  Substance Use Topics  . Smoking status: Former Smoker -- 0.25 packs/day for 65 years    Quit date: 11/11/2013  . Smokeless tobacco: Never Used  . Alcohol Use: No    Review of Systems  Constitutional: Negative for fever.  Cardiovascular: Negative for chest pain. Respiratory: Negative for shortness of breath. Gastrointestinal: Negative for abdominal pain, vomiting and diarrhea. Neurological: Negative for headaches, focal weakness or numbness.  10-point ROS otherwise  negative.  ____________________________________________   PHYSICAL EXAM:  VITAL SIGNS: ED Triage Vitals  Enc Vitals Group     BP 04/25/15 1659 103/58 mmHg     Pulse Rate 04/25/15 1659 130     Resp 04/25/15 1659 26     Temp 04/25/15 1659 97.6 F (36.4 C)     Temp Source 04/25/15 1659 Oral     SpO2 04/25/15 1659 98 %     Weight 04/25/15 1659 133 lb (60.328 kg)     Height 04/25/15 1659 5' (1.524 m)   Constitutional: Alert and oriented. Well appearing and in no distress. Eyes: Conjunctivae are normal. PERRL. Normal extraocular movements. ENT   Head: Normocephalic and atraumatic.   Nose: No congestion/rhinnorhea.   Mouth/Throat: Mucous membranes are moist.   Neck: No stridor. Hematological/Lymphatic/Immunilogical: No cervical lymphadenopathy. Cardiovascular: Tachycardic, regular rhythm.  No murmurs, rubs, or gallops. Respiratory: Normal respiratory effort without tachypnea nor retractions. Breath sounds are clear and equal bilaterally. No wheezes/rales/rhonchi. Gastrointestinal: Soft and nontender. No distention. atient had a small roughly 20 mL size bloody bowel movement as prior to my exam. Genitourinary: Deferred Musculoskeletal: Normal range of motion in all extremities. No joint effusions.  No lower extremity tenderness nor edema. Neurologic:  Normal speech and language. No gross focal neurologic deficits are appreciated.  Skin:  Skin is warm, dry and intact. No rash noted. Psychiatric: Mood and affect are normal. Speech and behavior are normal. Patient exhibits appropriate insight and judgment.  ____________________________________________    LABS (pertinent positives/negatives)  Labs Reviewed  COMPREHENSIVE METABOLIC PANEL - Abnormal; Notable for the following:    Potassium 3.2 (*)    Glucose, Bld 152 (*)    Calcium 8.8 (*)    Total Protein 6.3 (*)    ALT 12 (*)    All other components within normal limits  CBC - Abnormal; Notable for the following:     WBC 11.7 (*)    RBC 3.73 (*)    Hemoglobin 10.3 (*)    HCT 30.2 (*)    RDW 14.9 (*)    All other components within normal limits  PROTIME-INR  APTT  POC OCCULT BLOOD, ED  TYPE AND SCREEN  ABO/RH  PREPARE RBC (CROSSMATCH)     ____________________________________________   EKG  None  ____________________________________________    RADIOLOGY  None  ____________________________________________   PROCEDURES  Procedure(s) performed: None  Critical Care performed: Yes, see critical care note(s)  CRITICAL CARE Performed by: Phineas SemenGOODMAN, Sharonlee Nine   Total critical care time: 40 minutes  Critical care time was exclusive of separately billable procedures and treating other patients.  Critical care was necessary to treat or prevent imminent or life-threatening deterioration.  Critical care was time spent personally by me on the following activities: development of treatment plan with patient and/or surrogate as well as nursing, discussions with consultants, evaluation of patient's response to treatment, examination of patient, obtaining history from patient or surrogate, ordering and performing treatments and interventions, ordering and review of laboratory studies, ordering and review of radiographic studies, pulse oximetry and re-evaluation of patient's condition.  ____________________________________________   INITIAL IMPRESSION / ASSESSMENT AND PLAN / ED COURSE  Pertinent labs & imaging results that were available during my care of the patient were reviewed by me and considered in my medical decision making (see chart  for details).  Patient presented to the emergency department today because of concerns for bloody bowel movements. Patient's initial vital signs very concerning for tachycardia and relative hypotension. Because of these vital signs and continued GI bleeding the patient was typed and screened. She was given IV fluid bolus. She did respond well to the IV fluid  bolus and her tachycardia resolved. I did contact GI on call, Dr. Servando Snare who recommended NG tube to evaluate for upper vs lower GI bleed. NG tube was placed and flushed without any blood. Thus I do think likely lower GI bleed. As the patient remained without further tachycardia and blood pressure remained without further hypotension. Will defer blood transfusion at this time. Will admit to hospital service.  ____________________________________________   FINAL CLINICAL IMPRESSION(S) / ED DIAGNOSES  Final diagnoses:  Gastrointestinal hemorrhage, unspecified gastritis, unspecified gastrointestinal hemorrhage type     Phineas Semen, MD 04/25/15 2002

## 2015-04-25 NOTE — ED Notes (Signed)
NS infusion moved to LFA 18G and put on pressure bag per MD order.

## 2015-04-25 NOTE — ED Notes (Signed)
Pt had a large bloody stool in triage BR..Marland Kitchen

## 2015-04-26 ENCOUNTER — Inpatient Hospital Stay: Payer: Medicare Other

## 2015-04-26 DIAGNOSIS — F17210 Nicotine dependence, cigarettes, uncomplicated: Secondary | ICD-10-CM | POA: Diagnosis present

## 2015-04-26 DIAGNOSIS — Z881 Allergy status to other antibiotic agents status: Secondary | ICD-10-CM | POA: Diagnosis not present

## 2015-04-26 DIAGNOSIS — K219 Gastro-esophageal reflux disease without esophagitis: Secondary | ICD-10-CM | POA: Diagnosis present

## 2015-04-26 DIAGNOSIS — K5731 Diverticulosis of large intestine without perforation or abscess with bleeding: Secondary | ICD-10-CM | POA: Diagnosis present

## 2015-04-26 DIAGNOSIS — Z9981 Dependence on supplemental oxygen: Secondary | ICD-10-CM | POA: Diagnosis not present

## 2015-04-26 DIAGNOSIS — Z8249 Family history of ischemic heart disease and other diseases of the circulatory system: Secondary | ICD-10-CM | POA: Diagnosis not present

## 2015-04-26 DIAGNOSIS — I509 Heart failure, unspecified: Secondary | ICD-10-CM | POA: Diagnosis present

## 2015-04-26 DIAGNOSIS — I110 Hypertensive heart disease with heart failure: Secondary | ICD-10-CM | POA: Diagnosis present

## 2015-04-26 DIAGNOSIS — E785 Hyperlipidemia, unspecified: Secondary | ICD-10-CM | POA: Diagnosis present

## 2015-04-26 DIAGNOSIS — Z888 Allergy status to other drugs, medicaments and biological substances status: Secondary | ICD-10-CM | POA: Diagnosis not present

## 2015-04-26 DIAGNOSIS — Z885 Allergy status to narcotic agent status: Secondary | ICD-10-CM | POA: Diagnosis not present

## 2015-04-26 DIAGNOSIS — Z955 Presence of coronary angioplasty implant and graft: Secondary | ICD-10-CM | POA: Diagnosis not present

## 2015-04-26 DIAGNOSIS — Z7982 Long term (current) use of aspirin: Secondary | ICD-10-CM | POA: Diagnosis not present

## 2015-04-26 DIAGNOSIS — D649 Anemia, unspecified: Secondary | ICD-10-CM | POA: Diagnosis present

## 2015-04-26 DIAGNOSIS — F419 Anxiety disorder, unspecified: Secondary | ICD-10-CM | POA: Diagnosis present

## 2015-04-26 DIAGNOSIS — E039 Hypothyroidism, unspecified: Secondary | ICD-10-CM | POA: Diagnosis present

## 2015-04-26 DIAGNOSIS — K922 Gastrointestinal hemorrhage, unspecified: Secondary | ICD-10-CM | POA: Diagnosis present

## 2015-04-26 DIAGNOSIS — I2510 Atherosclerotic heart disease of native coronary artery without angina pectoris: Secondary | ICD-10-CM | POA: Diagnosis present

## 2015-04-26 DIAGNOSIS — J449 Chronic obstructive pulmonary disease, unspecified: Secondary | ICD-10-CM | POA: Diagnosis present

## 2015-04-26 DIAGNOSIS — J45909 Unspecified asthma, uncomplicated: Secondary | ICD-10-CM | POA: Diagnosis present

## 2015-04-26 LAB — BASIC METABOLIC PANEL
ANION GAP: 1 — AB (ref 5–15)
BUN: 15 mg/dL (ref 6–20)
CALCIUM: 7.8 mg/dL — AB (ref 8.9–10.3)
CO2: 27 mmol/L (ref 22–32)
CREATININE: 0.64 mg/dL (ref 0.44–1.00)
Chloride: 113 mmol/L — ABNORMAL HIGH (ref 101–111)
GLUCOSE: 107 mg/dL — AB (ref 65–99)
Potassium: 3.7 mmol/L (ref 3.5–5.1)
Sodium: 141 mmol/L (ref 135–145)

## 2015-04-26 LAB — CBC
HCT: 19.8 % — ABNORMAL LOW (ref 35.0–47.0)
HEMOGLOBIN: 6.7 g/dL — AB (ref 12.0–16.0)
MCH: 28.2 pg (ref 26.0–34.0)
MCHC: 33.8 g/dL (ref 32.0–36.0)
MCV: 83.2 fL (ref 80.0–100.0)
PLATELETS: 260 10*3/uL (ref 150–440)
RBC: 2.38 MIL/uL — AB (ref 3.80–5.20)
RDW: 15 % — ABNORMAL HIGH (ref 11.5–14.5)
WBC: 7.9 10*3/uL (ref 3.6–11.0)

## 2015-04-26 LAB — HEMOGLOBIN AND HEMATOCRIT, BLOOD
HCT: 22.7 % — ABNORMAL LOW (ref 35.0–47.0)
HEMOGLOBIN: 7.7 g/dL — AB (ref 12.0–16.0)

## 2015-04-26 LAB — PREPARE RBC (CROSSMATCH)

## 2015-04-26 LAB — HEMOGLOBIN: Hemoglobin: 10 g/dL — ABNORMAL LOW (ref 12.0–16.0)

## 2015-04-26 MED ORDER — ALBUTEROL SULFATE (2.5 MG/3ML) 0.083% IN NEBU
2.5000 mg | INHALATION_SOLUTION | Freq: Three times a day (TID) | RESPIRATORY_TRACT | Status: DC
  Administered 2015-04-26 – 2015-04-27 (×3): 2.5 mg via RESPIRATORY_TRACT
  Filled 2015-04-26 (×4): qty 3

## 2015-04-26 MED ORDER — SODIUM CHLORIDE 0.9 % IV SOLN
Freq: Once | INTRAVENOUS | Status: AC
  Administered 2015-04-26: 05:00:00 via INTRAVENOUS

## 2015-04-26 MED ORDER — TECHNETIUM TC 99M-LABELED RED BLOOD CELLS IV KIT
21.1900 | PACK | Freq: Once | INTRAVENOUS | Status: AC | PRN
  Administered 2015-04-26: 21.19 via INTRAVENOUS

## 2015-04-26 NOTE — Consult Note (Signed)
  Pt went down for bleeding scan when I came by. Talked to daughter at length. Hx of significant CAD, severe COPD on chronic 02. Started having gross rectal bleeding yesterday. According to daughter, had bleeding episode 12 years ago. Told that she had bleeding from polyp? No colonoscopy since then. Has triple vessel disease. Cardiology placed one stent in left main artery in 2015. Pt almost died on the table. Other arteries could not be treated. Pt still smokes actively. Had some GI issues last year. GI considered colonoscopy then. However, cardiology insisted pt cannot go off plavix at all. Thus, colonoscopy cancelled. Bleeding much worse than last time. Likely diverticular bleeding, exacerbated by plavix, which is on hold. If bleeding scan positive, vascular surgery can be consulted. Hopefully, bleeding will stop on own off plavix. At this time, colonoscopy will be high risk. Will follow. Thanks.

## 2015-04-26 NOTE — Progress Notes (Signed)
Second unit of blood finished at 3:40.  Lab came up as blood had finished to draw a 4:00 timed Hemoglobin Dr Elpidio AnisSudini had placed, called Sudini he stated that I did not need to pull a 1 hour post blood completion H&H as is the usual order

## 2015-04-26 NOTE — Care Management (Signed)
Admitted to [facility] with the diagnosis of GI Bleed. Lives alone. Daughters are Barrett HenleDorothy Brooks  [phone]) and Hewitt ShortsBrenda Reece [phone]). Last seen Dr. Elease HashimotoMaloney 2 weeks ago. No skilled nursing. Home oxygen thru [facility] 2 liters per nasal cannula, continuous x 1 year. Takes care of all basic and instrumental activities of daily living herself, drives. No Life Alert. No Falls. Good appetite.  Gwenette GreetBrenda s Khamila Bassinger RN MSN CCM Care Management [phone]

## 2015-04-26 NOTE — Plan of Care (Signed)
Problem: Education: Goal: Knowledge of [facility] Education information/materials will improve Outcome: Progressing Pt likes to be called Samantha Goodman  Past Medical History  Diagnosis Date  . COPD (chronic obstructive pulmonary disease) (HCC)   . Asthma   . CAD (coronary artery disease)   . Unstable angina pectoris (HCC)   . Hyperlipemia   . Agitation   . Fall   . Hypothyroid         Pt is well controlled with home medications.

## 2015-04-26 NOTE — Progress Notes (Signed)
[facility] [place] at [facility]   PATIENT NAME: Samantha Goodman    MR#:  [id number]  DATE OF BIRTH:  05/06/1935  SUBJECTIVE:  CHIEF COMPLAINT:   Chief Complaint  Patient presents with  . GI Bleeding   Rectal bleeding today AM at 4.30 AM. HB dropped significantly.  REVIEW OF SYSTEMS:    Review of Systems  Constitutional: Positive for malaise/fatigue. Negative for fever and chills.  HENT: Negative for sore throat.   Eyes: Negative for blurred vision, double vision and pain.  Respiratory: Negative for cough, hemoptysis, shortness of breath and wheezing.   Cardiovascular: Negative for chest pain, palpitations, orthopnea and leg swelling.  Gastrointestinal: Positive for blood in stool. Negative for heartburn, nausea, vomiting, abdominal pain, diarrhea and constipation.  Genitourinary: Negative for dysuria and hematuria.  Musculoskeletal: Negative for back pain and joint pain.  Skin: Negative for rash.  Neurological: Negative for sensory change, speech change, focal weakness and headaches.  Endo/Heme/Allergies: Does not bruise/bleed easily.  Psychiatric/Behavioral: Negative for depression. The patient is not nervous/anxious.     DRUG ALLERGIES:   Allergies  Allergen Reactions  . Codeine Nausea And Vomiting  . Fluticasone-Salmeterol Other (See Comments)    Reaction:  Unknown   . Levofloxacin Other (See Comments)    Reaction:  Confusion and hallucinations   . Tiotropium Bromide Monohydrate Rash    VITALS:  Blood pressure 106/48, pulse 77, temperature 97.6 F (36.4 C), temperature source Oral, resp. rate 18, height 5\' 5"  (1.651 m), weight 65.862 kg (145 lb 3.2 oz), SpO2 97 %.  PHYSICAL EXAMINATION:   Physical Exam  GENERAL:  80 y.o.-year-old patient lying in the bed with no acute distress. Pale EYES: Pupils equal, round, reactive to light and accommodation. No scleral icterus. Extraocular muscles intact.  HEENT: Head atraumatic, normocephalic.  Oropharynx and nasopharynx clear.  NECK:  Supple, no jugular venous distention. No thyroid enlargement, no tenderness.  LUNGS: Normal breath sounds bilaterally, no wheezing, rales, rhonchi. No use of accessory muscles of respiration.  CARDIOVASCULAR: S1, S2 normal. No murmurs, rubs, or gallops.  ABDOMEN: Soft, nontender, nondistended. Bowel sounds present. No organomegaly or mass.  EXTREMITIES: No cyanosis, clubbing or edema b/l.    NEUROLOGIC: Cranial nerves II through XII are intact. No focal Motor or sensory deficits b/l.   PSYCHIATRIC: The patient is alert and oriented x 3.  SKIN: No obvious rash, lesion, or ulcer.   LABORATORY PANEL:   CBC  Recent Labs Lab 04/26/15 0313  WBC 7.9  HGB 6.7*  HCT 19.8*  PLT 260   ------------------------------------------------------------------------------------------------------------------ Chemistries   Recent Labs Lab 04/25/15 1706 04/26/15 0313  NA 136 141  K 3.2* 3.7  CL 101 113*  CO2 26 27  GLUCOSE 152* 107*  BUN 17 15  CREATININE 0.84 0.64  CALCIUM 8.8* 7.8*  AST 23  --   ALT 12*  --   ALKPHOS 71  --   BILITOT 0.5  --    ------------------------------------------------------------------------------------------------------------------  Cardiac Enzymes No results for input(s): TROPONINI in the last 168 hours. ------------------------------------------------------------------------------------------------------------------  RADIOLOGY:  No results found.   ASSESSMENT AND PLAN:    80 year old female with past medical history of COPD, hypertension, hyperlipidemia, GERD, anxiety, she of coronary artery disease, hypothyroidism who presents to the hospital due to multiple episodes of rectal bleeding.  * GI bleed-this is most likely lower GI bleed Significant drop in Hb 10.5 --> 6.5 Check Nuc GI bleed scan. Transfuse 2 units PRBC. Pt has received 1 unit  of 2. GI consult. Vascular if Scan positive NPO  * COPD-no acute  exacerbation. -Continue Dulera, DuoNeb nebs as needed, albuterol inhaler, theophylline.  * GERD-continue Protonix.  * Essential hypertension-continue metoprolol.  * Hypothyroidism-continue Synthroid.  * Hyperlipidemia-continue atorvastatin.  * History of coronary artery disease-no acute chest pain presently. -Continue statin, beta blocker. . Stop Aspirin  All the records are reviewed and case discussed with Care Management/Social Workerr. Management plans discussed with the patient, family and they are in agreement.  CODE STATUS: FULL  DVT Prophylaxis: SCDs  TOTAL CC TIME TAKING CARE OF THIS PATIENT: 35 minutes.   POSSIBLE D/C IN 2-3 DAYS, DEPENDING ON CLINICAL CONDITION.  Milagros Loll R M.D on 04/26/2015 at 9:23 AM  Between 7am to 6pm - Pager - [phone]  After 6pm go to [email or web address] - password [email or web address]  [facility]  Office  [phone]  CC: Primary care physician; Mila Merry, MD  Note: This dictation was prepared with Dragon dictation along with smaller phrase technology. Any transcriptional errors that result from this process are unintentional.

## 2015-04-27 NOTE — Discharge Instructions (Signed)
KEEP YOUR APPT WITH CARDIOLOGY

## 2015-04-27 NOTE — Progress Notes (Signed)
Called Dr Allena KatzPatel patient had a small amount of bright right blood with stool, per MD no new orders.

## 2015-04-27 NOTE — Progress Notes (Signed)
Received MD order to discharge patient to home, reviewed discharge instructions and home meds with patient and patient verbalized understanding patients money in safe returned by security, discharged in wheelchair with family members and nursing to home o2 portable o2

## 2015-04-27 NOTE — Consult Note (Signed)
GI Inpatient Consult Note  Reason for Consult: LGI bleeding.   Attending Requesting Consult:  History of Present Illness: Samantha Goodman is a 79 y.o. female with rectal bleeding on plavix. Last bleeding episode on Thurs. Bleeding scan yesterday was neg for active bleeding. Pt transfused. No abdominal pain. Feels better. On O2.  Past Medical History:  Past Medical History  Diagnosis Date  . COPD (chronic obstructive pulmonary disease) (HCC)   . Asthma   . CAD (coronary artery disease)   . Unstable angina pectoris (HCC)   . Hyperlipemia   . Agitation   . Fall   . Hypothyroid     Problem List: Patient Active Problem List   Diagnosis Date Noted  . GI bleed 04/25/2015  . COPD (chronic obstructive pulmonary disease) (HCC) 11/21/2014  . Senile purpura (HCC) 11/21/2014  . Oxygen dependent 07/13/2014  . Acid reflux 07/09/2014  . Allergic rhinitis 07/06/2014  . Anxiety 07/06/2014  . Body mass index (BMI) of 23.0-23.9 in adult 07/06/2014  . CCF (congestive cardiac failure) (HCC) 07/06/2014  . Chronic constipation 07/06/2014  . Dizziness 07/06/2014  . Edema extremities 07/06/2014  . Bleeding gastrointestinal 07/06/2014  . Hypercholesteremia 07/06/2014  . Decreased potassium in the blood 07/06/2014  . Adult hypothyroidism 07/06/2014  . OP (osteoporosis) 07/06/2014  . Procidentia of rectum 07/06/2014  . Tobacco abuse, in remission 07/06/2014  . Anemia 07/04/2014  . Arteriosclerosis of coronary artery 03/09/2013    Past Surgical History: Past Surgical History  Procedure Laterality Date  . Eye surgery    . Abdominal hysterectomy    . Appendectomy    . Ankle surgery Right     Allergies: Allergies  Allergen Reactions  . Codeine Nausea And Vomiting  . Fluticasone-Salmeterol Other (See Comments)    Reaction:  Unknown   . Levofloxacin Other (See Comments)    Reaction:  Confusion and hallucinations   . Tiotropium Bromide Monohydrate Rash    Home Medications: Prescriptions  prior to admission  Medication Sig Dispense Refill Last Dose  . acetaminophen (TYLENOL) 500 MG tablet Take 500-1,000 mg by mouth every 6 (six) hours as needed for mild pain, fever or headache.    prn at prn  . aspirin EC 81 MG tablet Take 81 mg by mouth daily.   04/25/2015 at 0600  . atorvastatin (LIPITOR) 40 MG tablet Take 40 mg by mouth at bedtime.   04/24/2015 at Unknown time  . cholecalciferol (VITAMIN D) 1000 UNITS tablet Take 1,000 Units by mouth daily.   04/25/2015 at 0600  . clopidogrel (PLAVIX) 75 MG tablet Take 75 mg by mouth at bedtime.   04/24/2015 at Unknown time  . docusate sodium (COLACE) 250 MG capsule Take 250 mg by mouth daily.   04/25/2015 at 0600  . ferrous sulfate 325 (65 FE) MG tablet take 1 tablet by mouth every evening 90 tablet 1 04/24/2015 at Unknown time  . fluticasone (FLONASE) 50 MCG/ACT nasal spray Place 2 sprays into both nostrils daily as needed for rhinitis.   prn at prn  . furosemide (LASIX) 20 MG tablet Take 1 tablet (20 mg total) by mouth daily. 90 tablet 3 04/25/2015 at 0600  . guaiFENesin (MUCINEX) 600 MG 12 hr tablet Take 1 tablet (600 mg total) by mouth 2 (two) times daily. 20 tablet 0 04/25/2015 at 0600  . ipratropium-albuterol (DUONEB) 0.5-2.5 (3) MG/3ML SOLN Take 3 mLs by nebulization 4 (four) times daily as needed (for wheezing/shortness of breath).    prn at prn  .  levothyroxine (SYNTHROID, LEVOTHROID) 88 MCG tablet Take 88 mcg by mouth daily before breakfast.   04/25/2015 at 0600  . LORazepam (ATIVAN) 0.5 MG tablet Take 0.5 mg by mouth every 6 (six) hours as needed for anxiety.   prn at prn  . metoprolol succinate (TOPROL-XL) 25 MG 24 hr tablet Take 12.5 mg by mouth daily.   04/25/2015 at 0600  . montelukast (SINGULAIR) 10 MG tablet Take 10 mg by mouth at bedtime.   04/24/2015 at Unknown time  . pantoprazole (PROTONIX) 40 MG tablet Take 40 mg by mouth daily.   04/25/2015 at 0600  . polyethylene glycol (MIRALAX / GLYCOLAX) packet Take 17 g by mouth daily.    04/25/2015 at 0600  . Potassium 99 MG TABS Take 99 mg by mouth daily.    04/25/2015 at 0600  . PROAIR HFA 108 (90 Base) MCG/ACT inhaler inhale 2 puffs by mouth every 4 hours if needed for shortness of breath or wheezing 1 Inhaler 5 prn at prn  . SYMBICORT 80-4.5 MCG/ACT inhaler inhale 2 puffs by mouth twice a day 10.2 Inhaler 5 04/25/2015 at 0600  . theophylline (THEO-24) 200 MG 24 hr capsule Take 200 mg by mouth daily.   04/25/2015 at 0600   Home medication reconciliation was completed with the patient.   Scheduled Inpatient Medications:   . albuterol  2.5 mg Inhalation TID  . atorvastatin  40 mg Oral QHS  . cholecalciferol  1,000 Units Oral Daily  . docusate sodium  200 mg Oral Daily  . ferrous sulfate  325 mg Oral QPM  . furosemide  20 mg Oral Daily  . guaiFENesin  600 mg Oral BID  . levothyroxine  88 mcg Oral QAC breakfast  . metoprolol succinate  12.5 mg Oral Daily  . mometasone-formoterol  2 puff Inhalation BID  . montelukast  10 mg Oral QHS  . pantoprazole  40 mg Oral Daily  . potassium chloride  10 mEq Oral Daily  . theophylline  200 mg Oral Daily    Continuous Inpatient Infusions:   . sodium chloride 50 mL/hr at 04/27/15 0053    PRN Inpatient Medications:  acetaminophen **OR** acetaminophen, fluticasone, ipratropium-albuterol, LORazepam, ondansetron **OR** ondansetron (ZOFRAN) IV  Family History: family history includes Breast cancer in her sister; CAD in her brother; Lung cancer in her sister; Ovarian cancer in her sister; Prostate cancer in her father; Stroke in her mother.  The patient's family history is negative for inflammatory bowel disorders, GI malignancy, or solid organ transplantation.  Social History:   reports that she quit smoking about 17 months ago. She has never used smokeless tobacco. She reports that she does not drink alcohol or use illicit drugs. The patient denies ETOH, tobacco, or drug use though daughter maintains that pt still smokes in  secret.  Review of Systems: Constitutional: Weight is stable.  Eyes: No changes in vision. ENT: No oral lesions, sore throat.  GI: see HPI.  Heme/Lymph: No easy bruising.  CV: No chest pain.  GU: No hematuria.  Integumentary: No rashes.  Neuro: No headaches.  Psych: No depression/anxiety.  Endocrine: No heat/cold intolerance.  Allergic/Immunologic: No urticaria.  Resp: No cough, SOB.  Musculoskeletal: No joint swelling.    Physical Examination: BP 111/57 mmHg  Pulse 84  Temp(Src) 97.7 F (36.5 C) (Oral)  Resp 17  Ht 5\' 5"  (1.651 m)  Wt 65.862 kg (145 lb 3.2 oz)  BMI 24.16 kg/m2  SpO2 96% Gen: NAD, alert and oriented x 4 HEENT: PEERLA,  EOMI, Neck: supple, no JVD or thyromegaly Chest: decreased breath bilaterally, no wheezes, crackles, or other adventitious sounds CV: RRR, no m/g/c/r Abd: soft, NT, ND, +BS in all four quadrants; no HSM, guarding, ridigity, or rebound tenderness Ext: no edema, well perfused with 2+ pulses, Skin: no rash or lesions noted Lymph: no LAD  Data: Lab Results  Component Value Date   WBC 7.9 04/26/2015   HGB 10.0* 04/26/2015   HCT 22.7* 04/26/2015   MCV 83.2 04/26/2015   PLT 260 04/26/2015    Recent Labs Lab 04/26/15 0313 04/26/15 0929 04/26/15 1550  HGB 6.7* 7.7* 10.0*   Lab Results  Component Value Date   NA 141 04/26/2015   K 3.7 04/26/2015   CL 113* 04/26/2015   CO2 27 04/26/2015   BUN 15 04/26/2015   CREATININE 0.64 04/26/2015   GLU 82 04/04/2014   Lab Results  Component Value Date   ALT 12* 04/25/2015   AST 23 04/25/2015   ALKPHOS 71 04/25/2015   BILITOT 0.5 04/25/2015    Recent Labs Lab 04/25/15 1706  APTT 24  INR 1.00   Assessment/Plan: Ms. Farino is a 80 y.o. female with LGI bleeding. Stopped off plavix.  Recommendations: See my notes from yesterday. Pt high risk for colonoscopy. So, no plans for colonoscopy unless pt actively bleeds again.  Advance diet as tolerated. She will need to discuss with  cardiology when and if it is safe to resume.  Thank you for the consult. Please call with questions or concerns.  Jamika Sadek, Ezzard Standing, MD

## 2015-04-27 NOTE — Discharge Summary (Signed)
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 COPD (chronic obstructive pulmonary disease) (HCC)   . Asthma   . CAD (coronary artery disease)   . Unstable angina pectoris (HCC)   . Hyperlipemia   . Agitation   . Fall   . Hypothyroid     HOSPITAL COURSE:  80 year old female with past medical history of COPD, hypertension, hyperlipidemia, GERD, anxiety, she of coronary artery disease, hypothyroidism who presents to the hospital due to multiple episodes of rectal bleeding.  * GI bleed-this is most likely lower GI bleed Significant drop in Hb 10.5 --> 6.5--- s/p 2 units  hgb 10.1  Nuclear GI bleed scan--->negative . GI consult. Appreciated. No plans for colonoscopy Soft diet Pt off plavix and aspirin. Spoke with dter to call cardiology at [facility] to see if she needs to be opn both or can be on one of the two. dter [name] voiced understanding  * COPD-no acute exacerbation. -Continue Dulera, DuoNeb nebs as needed, albuterol inhaler, theophylline.  * GERD-continue Protonix.  * Essential hypertension-continue metoprolol.  * Hypothyroidism-continue Synthroid.  * Hyperlipidemia-continue atorvastatin.  * History of coronary artery disease-no acute chest pain presently. -Continue statin, beta blocker. . Stop Aspirin and plavix for now till she sees  her cardiologist  D/c home later today   CONSULTS OBTAINED:  Treatment Team:  Wallace Cullens, MD  DRUG ALLERGIES:   Allergies  Allergen Reactions  . Codeine Nausea And Vomiting  . Fluticasone-Salmeterol Other (See Comments)    Reaction:  Unknown   . Levofloxacin Other (See Comments)    Reaction:  Confusion and hallucinations   . Tiotropium Bromide Monohydrate Rash    DISCHARGE MEDICATIONS:   Current Discharge Medication List    CONTINUE these medications which have NOT CHANGED   Details  acetaminophen (TYLENOL) 500 MG tablet Take 500-1,000 mg by mouth every 6 (six) hours as needed for mild pain, fever or headache.     atorvastatin (LIPITOR) 40 MG tablet Take 40 mg by mouth at bedtime.    cholecalciferol (VITAMIN D) 1000 UNITS tablet Take 1,000 Units by mouth daily.    docusate sodium (COLACE) 250 MG capsule Take 250 mg by mouth daily.    ferrous sulfate 325 (65 FE) MG tablet take 1 tablet by mouth every evening Qty: 90 tablet, Refills: 1   Associated Diagnoses: Anemia, unspecified anemia type    fluticasone (FLONASE) 50 MCG/ACT nasal spray Place 2 sprays into both nostrils daily as needed for rhinitis.    furosemide (LASIX) 20 MG tablet Take 1 tablet (20 mg total) by mouth daily. Qty: 90 tablet, Refills: 3   Associated Diagnoses: Chronic congestive heart failure, unspecified congestive heart failure type (HCC)    guaiFENesin (MUCINEX) 600 MG 12 hr tablet Take 1 tablet (600 mg total) by mouth 2 (two) times daily. Qty: 20 tablet, Refills: 0    ipratropium-albuterol (DUONEB) 0.5-2.5 (3) MG/3ML SOLN Take 3 mLs  by nebulization 4 (four) times daily as needed (for wheezing/shortness of breath).     levothyroxine (SYNTHROID, LEVOTHROID) 88 MCG tablet Take 88 mcg by mouth daily before breakfast.    LORazepam (ATIVAN) 0.5 MG tablet Take 0.5 mg by mouth every 6 (six) hours as needed for anxiety.    metoprolol succinate (TOPROL-XL) 25 MG 24 hr tablet Take 12.5 mg by mouth daily.     montelukast (SINGULAIR) 10 MG tablet Take 10 mg by mouth at bedtime.    pantoprazole (PROTONIX) 40 MG tablet Take 40 mg by mouth daily.    polyethylene glycol (MIRALAX / GLYCOLAX) packet Take 17 g by mouth daily.    Potassium 99 MG TABS Take 99 mg by mouth daily.     PROAIR HFA 108 (90 Base) MCG/ACT inhaler inhale 2 puffs by mouth every 4 hours if needed for shortness of breath or wheezing Qty: 1 Inhaler, Refills: 5   Associated Diagnoses: Simple chronic bronchitis (HCC)    SYMBICORT 80-4.5 MCG/ACT inhaler inhale 2 puffs by mouth twice a day Qty: 10.2 Inhaler, Refills: 5   Associated Diagnoses: Simple chronic bronchitis (HCC)    theophylline (THEO-24) 200 MG 24 hr capsule Take 200 mg by mouth daily.      STOP taking these medications     aspirin EC 81 MG tablet      clopidogrel (PLAVIX) 75 MG tablet         If you experience worsening of your admission symptoms, develop shortness of breath, life threatening emergency, suicidal or homicidal thoughts you must seek medical attention immediately by calling 911 or calling your MD immediately  if symptoms less severe.  You Must read complete instructions/literature along with all the possible adverse reactions/side effects for all the Medicines you take and that have been prescribed to you. Take any new Medicines after you have completely understood and accept all the possible adverse reactions/side effects.   Please note  You were cared for by a hospitalist during your hospital stay. If you have any questions about your discharge medications or the care you received while you were in the hospital after you are discharged, you can call the unit and asked to speak with the hospitalist on call if the hospitalist that took care of you is not available. Once you are discharged, your primary care physician will handle any further medical issues. Please note that NO REFILLS for any discharge medications will be authorized once you are  discharged, as it is imperative that you return to your primary care physician (or establish a relationship with a primary care physician if you do not have one) for your aftercare needs so that they can reassess your need for medications and monitor your lab values. Today   SUBJECTIVE   No bloody stools since y'day morning  VITAL SIGNS:  Blood pressure 111/57, pulse 84, temperature 97.7 F (36.5 C), temperature source Oral, resp. rate 17, height 5\' 5"  (1.651 m), weight 65.862 kg (145 lb 3.2 oz), SpO2 96 %.  I/O:   Intake/Output Summary (Last 24 hours) at 04/27/15 1134 Last data filed at 04/27/15 0800  Gross per 24 hour  Intake   1346 ml  Output      0 ml  Net   1346 ml    PHYSICAL EXAMINATION:  GENERAL:  80 y.o.-year-old patient lying in the bed with no acute distress.  EYES: Pupils equal, round, reactive to light and accommodation. No scleral icterus. Extraocular muscles intact.  HEENT: Head atraumatic, normocephalic.  Oropharynx and nasopharynx clear.  NECK:  Supple, no jugular venous distention. No thyroid enlargement, no tenderness.  LUNGS: Normal breath sounds bilaterally, no wheezing, rales,rhonchi or crepitation. No use of accessory muscles of respiration.  CARDIOVASCULAR: S1, S2 normal. No murmurs, rubs, or gallops.  ABDOMEN: Soft, non-tender, non-distended. Bowel sounds present. No organomegaly or mass.  EXTREMITIES: No pedal edema, cyanosis, or clubbing.  NEUROLOGIC: Cranial nerves II through XII are intact. Muscle strength 5/5 in all extremities. Sensation intact. Gait not checked.  PSYCHIATRIC: The patient is alert and oriented x 3.  SKIN: No obvious rash, lesion, or ulcer.   DATA REVIEW:   CBC   Recent Labs Lab 04/26/15 0313 04/26/15 0929 04/26/15 1550  WBC 7.9  --   --   HGB 6.7* 7.7* 10.0*  HCT 19.8* 22.7*  --   PLT 260  --   --     Chemistries   Recent Labs Lab 04/25/15 1706 04/26/15 0313  NA 136 141  K 3.2* 3.7  CL 101 113*  CO2 26 27   GLUCOSE 152* 107*  BUN 17 15  CREATININE 0.84 0.64  CALCIUM 8.8* 7.8*  AST 23  --   ALT 12*  --   ALKPHOS 71  --   BILITOT 0.5  --     Microbiology Results   No results found for this or any previous visit (from the past 240 hour(s)).  RADIOLOGY:  Nm Gi Blood Loss  04/26/2015  CLINICAL DATA:  Bloody stools. EXAM: NUCLEAR MEDICINE GASTROINTESTINAL BLEEDING SCAN TECHNIQUE: Sequential abdominal images were obtained following intravenous administration of Tc-52m labeled red blood cells. RADIOPHARMACEUTICALS:  21.19 mCi Tc-40m in-vitro labeled red cells. COMPARISON:  CT abdomen and pelvis - 02/09/2014 FINDINGS: There is no abnormal radiotracer activity to suggest an acute / ongoing upper or lower GI bleed. A minimal amount of free technetium is noted within the stomach. A minimal amount of excreted radiotracer seen within the urinary bladder. Physiologic activity is seen within the liver, spleen and vascular structures of the abdomen and pelvis. IMPRESSION: No scintigraphic evidence of acute / ongoing upper or lower GI bleed. Electronically Signed   By: Simonne Come M.D.   On: 04/26/2015 12:04     Management plans discussed with the patient, family and they are in agreement.  CODE STATUS:     Code Status Orders        Start     Ordered   04/25/15 2125  Full code   Continuous     04/25/15 2124    Code Status History    Date Active Date Inactive Code Status Order ID Comments User Context   03/04/2015  2:46 PM 03/06/2015  7:15 PM DNR [id number]  Adrian Saran, MD Inpatient   11/14/2014  8:02 PM 11/17/2014  4:44 PM Full Code [id number]  Auburn Bilberry, MD Inpatient      TOTAL TIME TAKING CARE OF THIS PATIENT: 40 minutes.    Kerby Hockley M.D on 04/27/2015 at 11:34 AM  Between 7am to 6pm - Pager - [phone] After 6pm go to [email or web address] - password [email or web address] [facility]  [facility] [place] [facility]  Office  [phone]  CC: Primary care physician; Mila Merry, MD

## 2015-04-28 LAB — TYPE AND SCREEN
ABO/RH(D): B POS
ANTIBODY SCREEN: NEGATIVE
UNIT DIVISION: 0
UNIT DIVISION: 0
UNIT DIVISION: 0
UNIT DIVISION: 0
Unit division: 0
Unit division: 0

## 2015-04-29 ENCOUNTER — Telehealth: Payer: Self-pay | Admitting: Family Medicine

## 2015-04-29 NOTE — Telephone Encounter (Signed)
Ok to put in same day. Thanks.   

## 2015-04-29 NOTE — Telephone Encounter (Signed)
Pt stated that she needs to make a hospital f/u. Pt was discharged Saturday 04/27/15 and was seen for rectal bleeding. Pt stated she was advised to see Dr. Elease HashimotoMaloney for a follow up with in the week. Please advise. Thanks TNP

## 2015-04-29 NOTE — Telephone Encounter (Signed)
Is it okay to put in a same day slot?  Thanks,   -Vernona RiegerLaura

## 2015-04-29 NOTE — Telephone Encounter (Signed)
Spoke with pt and scheduled hospital follow up for Friday 05/03/15 @ 3:30 pm. Thanks TNP

## 2015-05-03 ENCOUNTER — Encounter: Payer: Self-pay | Admitting: Family Medicine

## 2015-05-03 ENCOUNTER — Ambulatory Visit (INDEPENDENT_AMBULATORY_CARE_PROVIDER_SITE_OTHER): Payer: Medicare Other | Admitting: Family Medicine

## 2015-05-03 VITALS — BP 98/50 | HR 88 | Temp 98.0°F | Resp 20 | Wt 145.0 lb

## 2015-05-03 DIAGNOSIS — D508 Other iron deficiency anemias: Secondary | ICD-10-CM

## 2015-05-03 DIAGNOSIS — Z09 Encounter for follow-up examination after completed treatment for conditions other than malignant neoplasm: Secondary | ICD-10-CM

## 2015-05-03 DIAGNOSIS — K2901 Acute gastritis with bleeding: Secondary | ICD-10-CM

## 2015-05-03 DIAGNOSIS — I2510 Atherosclerotic heart disease of native coronary artery without angina pectoris: Secondary | ICD-10-CM | POA: Diagnosis not present

## 2015-05-03 NOTE — Progress Notes (Signed)
Patient ID: Samantha Goodman, female   DOB: 03-23-1935, 80 y.o.   MRN: [id number]       Patient: Samantha Goodman Female    DOB: [birth year]   80 y.o.   MRN: [id number] Visit Date: 05/03/2015  Today's Provider: Lorie Phenix, MD   Chief Complaint  Patient presents with  . Hospitalization Follow-up   Subjective:    HPI  Follow up Hospitalization  Patient was admitted to [facility] on 04/25/2015 and discharged on 04/27/2015. She was treated for gastrointestinal hemorrhage. Treatment for this included antibiotic, blood and fluids. She reports excellent compliance with treatment. She reports this condition is Improved. Patient advised to discontinue plavix and aspirin until follow-up with cardiology. Patient reports that she is still taking aspirin 81 mg daily. ------------------------------------------------------------------------------------     Allergies  Allergen Reactions  . Codeine Nausea And Vomiting  . Fluticasone-Salmeterol Other (See Comments)    Reaction:  Unknown   . Levofloxacin Other (See Comments)    Reaction:  Confusion and hallucinations   . Tiotropium Bromide Monohydrate Rash   Previous Medications   ACETAMINOPHEN (TYLENOL) 500 MG TABLET    Take 500-1,000 mg by mouth every 6 (six) hours as needed for mild pain, fever or headache.    ASPIRIN 81 MG TABLET    Take 81 mg by mouth daily.   ATORVASTATIN (LIPITOR) 40 MG TABLET    Take 40 mg by mouth at bedtime.   CHOLECALCIFEROL (VITAMIN D) 1000 UNITS TABLET    Take 1,000 Units by mouth daily.   DOCUSATE SODIUM (COLACE) 250 MG CAPSULE    Take 250 mg by mouth daily.   FERROUS SULFATE 325 (65 FE) MG TABLET    take 1 tablet by mouth every evening   FLUTICASONE (FLONASE) 50 MCG/ACT NASAL SPRAY    Place 2 sprays into both nostrils daily as needed for rhinitis.   FUROSEMIDE (LASIX) 20 MG TABLET    Take 1 tablet (20 mg total) by mouth daily.   GUAIFENESIN (MUCINEX) 600 MG 12 HR TABLET    Take 1 tablet (600 mg total) by mouth 2 (two) times  daily.   IPRATROPIUM-ALBUTEROL (DUONEB) 0.5-2.5 (3) MG/3ML SOLN    Take 3 mLs by nebulization 4 (four) times daily as needed (for wheezing/shortness of breath).    LEVOTHYROXINE (SYNTHROID, LEVOTHROID) 88 MCG TABLET    Take 88 mcg by mouth daily before breakfast.   LORAZEPAM (ATIVAN) 0.5 MG TABLET    Take 0.5 mg by mouth every 6 (six) hours as needed for anxiety.   METOPROLOL SUCCINATE (TOPROL-XL) 25 MG 24 HR TABLET    Take 12.5 mg by mouth daily.   MONTELUKAST (SINGULAIR) 10 MG TABLET    Take 10 mg by mouth at bedtime.   PANTOPRAZOLE (PROTONIX) 40 MG TABLET    Take 40 mg by mouth daily.   POLYETHYLENE GLYCOL (MIRALAX / GLYCOLAX) PACKET    Take 17 g by mouth daily.   POTASSIUM 99 MG TABS    Take 99 mg by mouth daily.    PROAIR HFA 108 (90 BASE) MCG/ACT INHALER    inhale 2 puffs by mouth every 4 hours if needed for shortness of breath or wheezing   SYMBICORT 80-4.5 MCG/ACT INHALER    inhale 2 puffs by mouth twice a day   THEOPHYLLINE (THEO-24) 200 MG 24 HR CAPSULE    Take 200 mg by mouth daily.    Review of Systems  Constitutional: Negative.   Cardiovascular: Negative.   Gastrointestinal: Negative.  Social History  Substance Use Topics  . Smoking status: Former Smoker -- 0.25 packs/day for 65 years    Quit date: 11/11/2013  . Smokeless tobacco: Never Used  . Alcohol Use: No   Objective:   BP 98/50 mmHg  Pulse 88  Temp(Src) 98 F (36.7 C) (Oral)  Resp 20  Wt 145 lb (65.772 kg)  SpO2 95%  Physical Exam  Constitutional: She is oriented to person, place, and time. She appears well-developed and well-nourished.  HENT:  Oxygen per nasal cannula.   Cardiovascular: Normal rate, regular rhythm and normal heart sounds.   Pulmonary/Chest: Effort normal. She has wheezes.  Expiratory wheeze.   Neurological: She is alert and oriented to person, place, and time.  Psychiatric: She has a normal mood and affect. Her behavior is normal. Judgment and thought content normal.        Assessment & Plan:     1. Hospital discharge follow-up As below.   2. Arteriosclerosis of coronary artery Patient requested referral to Dr. Mariah MillingGollan. - Ambulatory referral to Cardiology  3. Gastrointestinal hemorrhage associated with acute gastritis Stable. F/U pending lab report. Patient advised to continue current medication and plan of care. - CBC with Differential/Platelet - Ferritin - Iron and TIBC  4. Other iron deficiency anemias Stable. Patient advised to continue current medication and plan of care. - CBC with Differential/Platelet - Ferritin - Iron and TIBC     Patient seen and examined by Dr. Leo GrosserNancy J.. Almer Littleton, and note scribed by Liz BeachSulibeya S. Dimas, CMA. I have reviewed the document for accuracy and completeness and I agree with above. - Leo GrosserNancy J. Tyris Eliot, MD   Lorie PhenixNancy Estanislado Surgeon, MD  [facility]

## 2015-05-04 LAB — IRON AND TIBC
IRON SATURATION: 6 % — AB (ref 15–55)
IRON: 17 ug/dL — AB (ref 27–139)
TIBC: 275 ug/dL (ref 250–450)
UIBC: 258 ug/dL (ref 118–369)

## 2015-05-04 LAB — CBC WITH DIFFERENTIAL/PLATELET
Basophils Absolute: 0 10*3/uL (ref 0.0–0.2)
Basos: 0 %
EOS (ABSOLUTE): 0.1 10*3/uL (ref 0.0–0.4)
EOS: 1 %
HEMATOCRIT: 29.8 % — AB (ref 34.0–46.6)
HEMOGLOBIN: 9.8 g/dL — AB (ref 11.1–15.9)
Immature Grans (Abs): 0 10*3/uL (ref 0.0–0.1)
Immature Granulocytes: 1 %
LYMPHS ABS: 2.7 10*3/uL (ref 0.7–3.1)
Lymphs: 30 %
MCH: 27.8 pg (ref 26.6–33.0)
MCHC: 32.9 g/dL (ref 31.5–35.7)
MCV: 84 fL (ref 79–97)
MONOCYTES: 10 %
MONOS ABS: 0.9 10*3/uL (ref 0.1–0.9)
NEUTROS ABS: 5.1 10*3/uL (ref 1.4–7.0)
Neutrophils: 58 %
Platelets: 480 10*3/uL — ABNORMAL HIGH (ref 150–379)
RBC: 3.53 x10E6/uL — AB (ref 3.77–5.28)
RDW: 15 % (ref 12.3–15.4)
WBC: 8.9 10*3/uL (ref 3.4–10.8)

## 2015-05-04 LAB — FERRITIN: Ferritin: 20 ng/mL (ref 15–150)

## 2015-05-06 ENCOUNTER — Telehealth: Payer: Self-pay

## 2015-05-06 NOTE — Telephone Encounter (Signed)
Patient advised as below.  

## 2015-05-06 NOTE — Telephone Encounter (Signed)
-----   Message from Lorie PhenixNancy Maloney, MD sent at 05/04/2015  9:07 AM EDT ----- Labs stable but iron and hemoglobin are both still low. Continue iron supplements. Thanks.

## 2015-05-09 DIAGNOSIS — K264 Chronic or unspecified duodenal ulcer with hemorrhage: Secondary | ICD-10-CM | POA: Diagnosis not present

## 2015-05-09 DIAGNOSIS — Z720 Tobacco use: Secondary | ICD-10-CM | POA: Diagnosis not present

## 2015-05-09 DIAGNOSIS — I25118 Atherosclerotic heart disease of native coronary artery with other forms of angina pectoris: Secondary | ICD-10-CM | POA: Diagnosis not present

## 2015-05-09 DIAGNOSIS — F411 Generalized anxiety disorder: Secondary | ICD-10-CM | POA: Diagnosis not present

## 2015-05-09 DIAGNOSIS — I2510 Atherosclerotic heart disease of native coronary artery without angina pectoris: Secondary | ICD-10-CM | POA: Insufficient documentation

## 2015-05-09 DIAGNOSIS — J449 Chronic obstructive pulmonary disease, unspecified: Secondary | ICD-10-CM | POA: Diagnosis not present

## 2015-05-11 DIAGNOSIS — J449 Chronic obstructive pulmonary disease, unspecified: Secondary | ICD-10-CM | POA: Diagnosis not present

## 2015-05-16 ENCOUNTER — Encounter: Payer: Self-pay | Admitting: *Deleted

## 2015-05-17 ENCOUNTER — Other Ambulatory Visit: Payer: Self-pay | Admitting: Family Medicine

## 2015-05-17 MED ORDER — AZITHROMYCIN 250 MG PO TABS: ORAL_TABLET | ORAL | Status: DC

## 2015-05-17 NOTE — Telephone Encounter (Signed)
Ok to send in rx. Thanks.  

## 2015-05-17 NOTE — Telephone Encounter (Signed)
RX sent   Thanks,   -Stalin Gruenberg  

## 2015-05-17 NOTE — Telephone Encounter (Signed)
Pt stated that when she was seen in the office on 05/06/15 she was advised to call back if she needed a refill of azithromycin (ZITHROMAX). [facility] [address] Please advise. Thanks TNP

## 2015-05-20 ENCOUNTER — Encounter: Payer: Self-pay | Admitting: Emergency Medicine

## 2015-05-20 ENCOUNTER — Ambulatory Visit: Payer: Medicare Other | Admitting: Family Medicine

## 2015-05-20 ENCOUNTER — Inpatient Hospital Stay
Admission: EM | Admit: 2015-05-20 | Discharge: 2015-05-23 | DRG: 190 | Disposition: A | Discharge status: Home / Self Care | Payer: Medicare Other | Attending: Internal Medicine | Admitting: Internal Medicine

## 2015-05-20 ENCOUNTER — Emergency Department: Payer: Medicare Other

## 2015-05-20 DIAGNOSIS — I5030 Unspecified diastolic (congestive) heart failure: Secondary | ICD-10-CM | POA: Diagnosis not present

## 2015-05-20 DIAGNOSIS — D649 Anemia, unspecified: Secondary | ICD-10-CM | POA: Diagnosis present

## 2015-05-20 DIAGNOSIS — Z79899 Other long term (current) drug therapy: Secondary | ICD-10-CM | POA: Diagnosis not present

## 2015-05-20 DIAGNOSIS — Z888 Allergy status to other drugs, medicaments and biological substances status: Secondary | ICD-10-CM

## 2015-05-20 DIAGNOSIS — J441 Chronic obstructive pulmonary disease with (acute) exacerbation: Secondary | ICD-10-CM | POA: Diagnosis present

## 2015-05-20 DIAGNOSIS — Z7951 Long term (current) use of inhaled steroids: Secondary | ICD-10-CM

## 2015-05-20 DIAGNOSIS — Z801 Family history of malignant neoplasm of trachea, bronchus and lung: Secondary | ICD-10-CM

## 2015-05-20 DIAGNOSIS — Z8041 Family history of malignant neoplasm of ovary: Secondary | ICD-10-CM | POA: Diagnosis not present

## 2015-05-20 DIAGNOSIS — Z66 Do not resuscitate: Secondary | ICD-10-CM | POA: Diagnosis present

## 2015-05-20 DIAGNOSIS — Z7982 Long term (current) use of aspirin: Secondary | ICD-10-CM

## 2015-05-20 DIAGNOSIS — J440 Chronic obstructive pulmonary disease with acute lower respiratory infection: Secondary | ICD-10-CM | POA: Diagnosis not present

## 2015-05-20 DIAGNOSIS — Z886 Allergy status to analgesic agent status: Secondary | ICD-10-CM | POA: Diagnosis not present

## 2015-05-20 DIAGNOSIS — I110 Hypertensive heart disease with heart failure: Secondary | ICD-10-CM | POA: Diagnosis not present

## 2015-05-20 DIAGNOSIS — J189 Pneumonia, unspecified organism: Secondary | ICD-10-CM | POA: Diagnosis present

## 2015-05-20 DIAGNOSIS — K219 Gastro-esophageal reflux disease without esophagitis: Secondary | ICD-10-CM | POA: Diagnosis present

## 2015-05-20 DIAGNOSIS — Z9981 Dependence on supplemental oxygen: Secondary | ICD-10-CM

## 2015-05-20 DIAGNOSIS — Z9049 Acquired absence of other specified parts of digestive tract: Secondary | ICD-10-CM

## 2015-05-20 DIAGNOSIS — I5032 Chronic diastolic (congestive) heart failure: Secondary | ICD-10-CM | POA: Diagnosis present

## 2015-05-20 DIAGNOSIS — Z823 Family history of stroke: Secondary | ICD-10-CM

## 2015-05-20 DIAGNOSIS — E039 Hypothyroidism, unspecified: Secondary | ICD-10-CM | POA: Diagnosis present

## 2015-05-20 DIAGNOSIS — I2510 Atherosclerotic heart disease of native coronary artery without angina pectoris: Secondary | ICD-10-CM | POA: Diagnosis present

## 2015-05-20 DIAGNOSIS — Z90710 Acquired absence of both cervix and uterus: Secondary | ICD-10-CM | POA: Diagnosis not present

## 2015-05-20 DIAGNOSIS — J9620 Acute and chronic respiratory failure, unspecified whether with hypoxia or hypercapnia: Secondary | ICD-10-CM | POA: Diagnosis not present

## 2015-05-20 DIAGNOSIS — Z803 Family history of malignant neoplasm of breast: Secondary | ICD-10-CM

## 2015-05-20 DIAGNOSIS — J45909 Unspecified asthma, uncomplicated: Secondary | ICD-10-CM | POA: Diagnosis present

## 2015-05-20 DIAGNOSIS — R0602 Shortness of breath: Secondary | ICD-10-CM | POA: Diagnosis not present

## 2015-05-20 DIAGNOSIS — Z9889 Other specified postprocedural states: Secondary | ICD-10-CM | POA: Diagnosis not present

## 2015-05-20 DIAGNOSIS — Z87891 Personal history of nicotine dependence: Secondary | ICD-10-CM

## 2015-05-20 DIAGNOSIS — Z8249 Family history of ischemic heart disease and other diseases of the circulatory system: Secondary | ICD-10-CM

## 2015-05-20 HISTORY — DX: Heart failure, unspecified: I50.9

## 2015-05-20 LAB — CBC
HCT: 33.1 % — ABNORMAL LOW (ref 35.0–47.0)
Hemoglobin: 11 g/dL — ABNORMAL LOW (ref 12.0–16.0)
MCH: 27.4 pg (ref 26.0–34.0)
MCHC: 33.2 g/dL (ref 32.0–36.0)
MCV: 82.5 fL (ref 80.0–100.0)
PLATELETS: 394 10*3/uL (ref 150–440)
RBC: 4.01 MIL/uL (ref 3.80–5.20)
RDW: 14.6 % — AB (ref 11.5–14.5)
WBC: 15.7 10*3/uL — AB (ref 3.6–11.0)

## 2015-05-20 LAB — BASIC METABOLIC PANEL
Anion gap: 12 (ref 5–15)
BUN: 18 mg/dL (ref 6–20)
CALCIUM: 9 mg/dL (ref 8.9–10.3)
CO2: 30 mmol/L (ref 22–32)
Chloride: 102 mmol/L (ref 101–111)
Creatinine, Ser: 0.84 mg/dL (ref 0.44–1.00)
GFR calc Af Amer: >60 mL/min (ref >60)
GLUCOSE: 98 mg/dL (ref 65–99)
Potassium: 3.7 mmol/L (ref 3.5–5.1)
SODIUM: 144 mmol/L (ref 135–145)

## 2015-05-20 LAB — TROPONIN I: Troponin I: <0.03 ng/mL (ref <0.031)

## 2015-05-20 MED ORDER — IPRATROPIUM-ALBUTEROL 0.5-2.5 (3) MG/3ML IN SOLN
3.0000 mL | Freq: Once | RESPIRATORY_TRACT | Status: AC
  Administered 2015-05-20: 3 mL via RESPIRATORY_TRACT
  Filled 2015-05-20: qty 3

## 2015-05-20 MED ORDER — FERROUS SULFATE 325 (65 FE) MG PO TABS
325.0000 mg | ORAL_TABLET | Freq: Every evening | ORAL | Status: DC
  Administered 2015-05-20 – 2015-05-22 (×3): 325 mg via ORAL
  Filled 2015-05-20 (×3): qty 1

## 2015-05-20 MED ORDER — DEXTROSE 5 % IV SOLN
1.0000 g | Freq: Once | INTRAVENOUS | Status: AC
  Administered 2015-05-20: 1 g via INTRAVENOUS
  Filled 2015-05-20: qty 10

## 2015-05-20 MED ORDER — VITAMIN D 1000 UNITS PO TABS
1000.0000 [IU] | ORAL_TABLET | Freq: Every day | ORAL | Status: DC
  Administered 2015-05-21 – 2015-05-23 (×3): 1000 [IU] via ORAL
  Filled 2015-05-20 (×3): qty 1

## 2015-05-20 MED ORDER — NICOTINE POLACRILEX 2 MG MT GUM
2.0000 mg | CHEWING_GUM | OROMUCOSAL | Status: DC | PRN
  Filled 2015-05-20: qty 1

## 2015-05-20 MED ORDER — ACETAMINOPHEN 325 MG PO TABS: 650.0000 mg | ORAL_TABLET | Freq: Four times a day (QID) | ORAL | Status: DC | PRN

## 2015-05-20 MED ORDER — FLUTICASONE PROPIONATE 50 MCG/ACT NA SUSP
2.0000 | Freq: Every day | NASAL | Status: DC | PRN
Start: 2015-05-20 — End: 2015-05-23
  Filled 2015-05-20: qty 16

## 2015-05-20 MED ORDER — ATORVASTATIN CALCIUM 20 MG PO TABS
40.0000 mg | ORAL_TABLET | Freq: Every day | ORAL | Status: DC
  Administered 2015-05-20 – 2015-05-22 (×3): 40 mg via ORAL
  Filled 2015-05-20 (×3): qty 2

## 2015-05-20 MED ORDER — CEFTRIAXONE SODIUM 1 G IJ SOLR
1.0000 g | INTRAMUSCULAR | Status: DC
  Administered 2015-05-21 – 2015-05-22 (×2): 1 g via INTRAVENOUS
  Filled 2015-05-20 (×3): qty 10

## 2015-05-20 MED ORDER — SODIUM CHLORIDE 0.9% FLUSH
3.0000 mL | Freq: Two times a day (BID) | INTRAVENOUS | Status: DC
  Administered 2015-05-20 – 2015-05-22 (×6): 3 mL via INTRAVENOUS

## 2015-05-20 MED ORDER — THEOPHYLLINE ER 200 MG PO CP24
200.0000 mg | ORAL_CAPSULE | Freq: Every day | ORAL | Status: DC
  Administered 2015-05-21 – 2015-05-23 (×3): 200 mg via ORAL
  Filled 2015-05-20 (×3): qty 1

## 2015-05-20 MED ORDER — ALBUTEROL SULFATE (2.5 MG/3ML) 0.083% IN NEBU
INHALATION_SOLUTION | RESPIRATORY_TRACT | Status: AC
  Filled 2015-05-20: qty 6

## 2015-05-20 MED ORDER — MOMETASONE FURO-FORMOTEROL FUM 100-5 MCG/ACT IN AERO
2.0000 | INHALATION_SPRAY | Freq: Two times a day (BID) | RESPIRATORY_TRACT | Status: DC
  Administered 2015-05-21 – 2015-05-23 (×5): 2 via RESPIRATORY_TRACT
  Filled 2015-05-20 (×3): qty 8.8

## 2015-05-20 MED ORDER — MONTELUKAST SODIUM 10 MG PO TABS
10.0000 mg | ORAL_TABLET | Freq: Every day | ORAL | Status: DC
  Administered 2015-05-20 – 2015-05-22 (×3): 10 mg via ORAL
  Filled 2015-05-20 (×3): qty 1

## 2015-05-20 MED ORDER — ALBUTEROL SULFATE (2.5 MG/3ML) 0.083% IN NEBU
5.0000 mg | INHALATION_SOLUTION | Freq: Once | RESPIRATORY_TRACT | Status: AC
  Administered 2015-05-20: 5 mg via RESPIRATORY_TRACT

## 2015-05-20 MED ORDER — METHYLPREDNISOLONE SODIUM SUCC 125 MG IJ SOLR
125.0000 mg | Freq: Once | INTRAMUSCULAR | Status: AC
  Administered 2015-05-20: 125 mg via INTRAVENOUS
  Filled 2015-05-20: qty 2

## 2015-05-20 MED ORDER — LORAZEPAM 0.5 MG PO TABS: 0.5000 mg | ORAL_TABLET | Freq: Four times a day (QID) | ORAL | Status: DC | PRN

## 2015-05-20 MED ORDER — ONDANSETRON HCL 4 MG PO TABS
4.0000 mg | ORAL_TABLET | Freq: Four times a day (QID) | ORAL | Status: DC | PRN
Start: 2015-05-20 — End: 2015-05-23

## 2015-05-20 MED ORDER — POTASSIUM CHLORIDE CRYS ER 20 MEQ PO TBCR
20.0000 meq | EXTENDED_RELEASE_TABLET | Freq: Every day | ORAL | Status: AC
  Administered 2015-05-20 – 2015-05-21 (×2): 20 meq via ORAL
  Filled 2015-05-20 (×2): qty 1

## 2015-05-20 MED ORDER — DEXTROSE 5 % IV SOLN
500.0000 mg | INTRAVENOUS | Status: DC
  Administered 2015-05-20: 500 mg via INTRAVENOUS
  Filled 2015-05-20 (×3): qty 500

## 2015-05-20 MED ORDER — DOCUSATE SODIUM 100 MG PO CAPS
100.0000 mg | ORAL_CAPSULE | Freq: Two times a day (BID) | ORAL | Status: DC
  Administered 2015-05-20 – 2015-05-23 (×6): 100 mg via ORAL
  Filled 2015-05-20 (×6): qty 1

## 2015-05-20 MED ORDER — ENOXAPARIN SODIUM 40 MG/0.4ML ~~LOC~~ SOLN
40.0000 mg | SUBCUTANEOUS | Status: DC
  Administered 2015-05-20 – 2015-05-22 (×3): 40 mg via SUBCUTANEOUS
  Filled 2015-05-20 (×3): qty 0.4

## 2015-05-20 MED ORDER — FUROSEMIDE 20 MG PO TABS
20.0000 mg | ORAL_TABLET | Freq: Every day | ORAL | Status: DC
Start: 2015-05-21 — End: 2015-05-23
  Administered 2015-05-21 – 2015-05-23 (×3): 20 mg via ORAL
  Filled 2015-05-20 (×3): qty 1

## 2015-05-20 MED ORDER — LEVOTHYROXINE SODIUM 88 MCG PO TABS
88.0000 ug | ORAL_TABLET | Freq: Every day | ORAL | Status: DC
  Administered 2015-05-21 – 2015-05-23 (×3): 88 ug via ORAL
  Filled 2015-05-20 (×3): qty 1

## 2015-05-20 MED ORDER — IPRATROPIUM-ALBUTEROL 0.5-2.5 (3) MG/3ML IN SOLN
3.0000 mL | RESPIRATORY_TRACT | Status: DC
  Administered 2015-05-20 – 2015-05-23 (×18): 3 mL via RESPIRATORY_TRACT
  Filled 2015-05-20 (×18): qty 3

## 2015-05-20 MED ORDER — ACETAMINOPHEN 650 MG RE SUPP: 650.0000 mg | Freq: Four times a day (QID) | RECTAL | Status: DC | PRN

## 2015-05-20 MED ORDER — PANTOPRAZOLE SODIUM 40 MG PO TBEC
40.0000 mg | DELAYED_RELEASE_TABLET | Freq: Every day | ORAL | Status: DC
  Administered 2015-05-21 – 2015-05-23 (×3): 40 mg via ORAL
  Filled 2015-05-20 (×3): qty 1

## 2015-05-20 MED ORDER — ASPIRIN 81 MG PO CHEW
81.0000 mg | CHEWABLE_TABLET | Freq: Every day | ORAL | Status: DC
  Administered 2015-05-21 – 2015-05-23 (×3): 81 mg via ORAL
  Filled 2015-05-20 (×4): qty 1

## 2015-05-20 MED ORDER — METOPROLOL SUCCINATE ER 25 MG PO TB24
12.5000 mg | ORAL_TABLET | Freq: Every day | ORAL | Status: DC
  Administered 2015-05-21 – 2015-05-23 (×3): 12.5 mg via ORAL
  Filled 2015-05-20 (×3): qty 1

## 2015-05-20 MED ORDER — POLYETHYLENE GLYCOL 3350 17 G PO PACK: 17.0000 g | PACK | Freq: Every day | ORAL | Status: DC | PRN

## 2015-05-20 MED ORDER — METHYLPREDNISOLONE SODIUM SUCC 125 MG IJ SOLR
60.0000 mg | Freq: Three times a day (TID) | INTRAMUSCULAR | Status: DC
  Administered 2015-05-20 – 2015-05-22 (×6): 60 mg via INTRAVENOUS
  Filled 2015-05-20 (×6): qty 2

## 2015-05-20 MED ORDER — CLOPIDOGREL BISULFATE 75 MG PO TABS
75.0000 mg | ORAL_TABLET | Freq: Every day | ORAL | Status: DC
  Administered 2015-05-20 – 2015-05-22 (×3): 75 mg via ORAL
  Filled 2015-05-20 (×3): qty 1

## 2015-05-20 MED ORDER — GUAIFENESIN ER 600 MG PO TB12
600.0000 mg | ORAL_TABLET | Freq: Two times a day (BID) | ORAL | Status: DC
  Administered 2015-05-20 – 2015-05-23 (×6): 600 mg via ORAL
  Filled 2015-05-20 (×6): qty 1

## 2015-05-20 MED ORDER — ONDANSETRON HCL 4 MG/2ML IJ SOLN: 4.0000 mg | Freq: Four times a day (QID) | INTRAMUSCULAR | Status: DC | PRN

## 2015-05-20 NOTE — ED Provider Notes (Signed)
[facility] Emergency Department Provider Note   ____________________________________________  Time seen:  I have reviewed the triage vital signs and the triage nursing note.  HISTORY  Chief Complaint Shortness of Breath and Cough   Historian Patient  HPI Samantha Goodman is a 80 y.o. female with a history of COPD who is on 2 L home oxygen, is here for trouble breathing coughing and bringing up yellow sputum. Denies fever but states that she's had some hot flashes. No chest pain or palpitations. Her symptoms started on Friday and her primary care physician started her on azithromycin as well as prednisone and she continues to use her inhalers at home. She's had persistence if not worsening of her dyspnea and sputum production over the weekend.  Symptoms are moderate. Exertion makes a little worse.    Past Medical History  Diagnosis Date  . COPD (chronic obstructive pulmonary disease) (HCC)   . Asthma   . CAD (coronary artery disease)   . Unstable angina pectoris (HCC)   . Hyperlipemia   . Agitation   . Fall   . Hypothyroid     Patient Active Problem List   Diagnosis Date Noted  . GI bleed 04/25/2015  . COPD (chronic obstructive pulmonary disease) (HCC) 11/21/2014  . Senile purpura (HCC) 11/21/2014  . Oxygen dependent 07/13/2014  . Acid reflux 07/09/2014  . Allergic rhinitis 07/06/2014  . Anxiety 07/06/2014  . Body mass index (BMI) of 23.0-23.9 in adult 07/06/2014  . CCF (congestive cardiac failure) (HCC) 07/06/2014  . Chronic constipation 07/06/2014  . Dizziness 07/06/2014  . Edema extremities 07/06/2014  . Bleeding gastrointestinal 07/06/2014  . Hypercholesteremia 07/06/2014  . Decreased potassium in the blood 07/06/2014  . Adult hypothyroidism 07/06/2014  . OP (osteoporosis) 07/06/2014  . Procidentia of rectum 07/06/2014  . Tobacco abuse, in remission 07/06/2014  . Anemia 07/04/2014  . Arteriosclerosis of coronary artery 03/09/2013     Past Surgical History  Procedure Laterality Date  . Eye surgery    . Abdominal hysterectomy    . Appendectomy    . Ankle surgery Right     Current Outpatient Rx  Name  Route  Sig  Dispense  Refill  . acetaminophen (TYLENOL) 500 MG tablet   Oral   Take 500-1,000 mg by mouth every 6 (six) hours as needed for mild pain, fever or headache.          Marland Kitchen aspirin 81 MG tablet   Oral   Take 81 mg by mouth daily.         Marland Kitchen atorvastatin (LIPITOR) 40 MG tablet   Oral   Take 40 mg by mouth at bedtime.         Marland Kitchen azithromycin (ZITHROMAX) 250 MG tablet      Take two tablets for one day and decrease to one a day unit gone.   6 tablet   0   . cholecalciferol (VITAMIN D) 1000 UNITS tablet   Oral   Take 1,000 Units by mouth daily.         Marland Kitchen docusate sodium (COLACE) 250 MG capsule   Oral   Take 250 mg by mouth daily.         . ferrous sulfate 325 (65 FE) MG tablet      take 1 tablet by mouth every evening   90 tablet   1   . fluticasone (FLONASE) 50 MCG/ACT nasal spray   Each Nare   Place 2 sprays into both nostrils daily  as needed for rhinitis.         . furosemide (LASIX) 20 MG tablet   Oral   Take 1 tablet (20 mg total) by mouth daily.   90 tablet   3   . guaiFENesin (MUCINEX) 600 MG 12 hr tablet   Oral   Take 1 tablet (600 mg total) by mouth 2 (two) times daily.   20 tablet   0   . ipratropium-albuterol (DUONEB) 0.5-2.5 (3) MG/3ML SOLN   Nebulization   Take 3 mLs by nebulization 4 (four) times daily as needed (for wheezing/shortness of breath).          Marland Kitchen levothyroxine (SYNTHROID, LEVOTHROID) 88 MCG tablet   Oral   Take 88 mcg by mouth daily before breakfast.         . LORazepam (ATIVAN) 0.5 MG tablet   Oral   Take 0.5 mg by mouth every 6 (six) hours as needed for anxiety.         . metoprolol succinate (TOPROL-XL) 25 MG 24 hr tablet   Oral   Take 12.5 mg by mouth daily.         . montelukast (SINGULAIR) 10 MG tablet   Oral   Take 10  mg by mouth at bedtime.         . pantoprazole (PROTONIX) 40 MG tablet   Oral   Take 40 mg by mouth daily.         . polyethylene glycol (MIRALAX / GLYCOLAX) packet   Oral   Take 17 g by mouth daily.         . Potassium 99 MG TABS   Oral   Take 99 mg by mouth daily.          Marland Kitchen PROAIR HFA 108 (90 Base) MCG/ACT inhaler      inhale 2 puffs by mouth every 4 hours if needed for shortness of breath or wheezing   1 Inhaler   5   . SYMBICORT 80-4.5 MCG/ACT inhaler      inhale 2 puffs by mouth twice a day   10.2 Inhaler   5     Manufacturer Coupon Available.   . theophylline (THEO-24) 200 MG 24 hr capsule   Oral   Take 200 mg by mouth daily.           Allergies Codeine; Fluticasone-salmeterol; Levofloxacin; and Tiotropium bromide monohydrate  Family History  Problem Relation Age of Onset  . Stroke Mother   . Prostate cancer Father   . CAD Brother   . Ovarian cancer Sister   . Breast cancer Sister   . Lung cancer Sister     Social History Social History  Substance Use Topics  . Smoking status: Former Smoker -- 0.25 packs/day for 65 years    Quit date: 11/11/2013  . Smokeless tobacco: Never Used  . Alcohol Use: No    Review of Systems  Constitutional: Negative for fever. Eyes: Negative for visual changes. ENT: Negative for sore throat. Cardiovascular: Negative for chest pain. Respiratory: Positive for shortness of breath. Gastrointestinal: Negative for abdominal pain, vomiting and diarrhea. Genitourinary: Negative for dysuria. Musculoskeletal: Negative for back pain. Skin: Negative for rash. Neurological: Negative for headache. 10 point Review of Systems otherwise negative ____________________________________________   PHYSICAL EXAM:  VITAL SIGNS: ED Triage Vitals  Enc Vitals Group     BP 05/20/15 0925 119/47 mmHg     Pulse Rate 05/20/15 0925 85     Resp 05/20/15 0925 28  Temp 05/20/15 0925 97.9 F (36.6 C)     Temp Source 05/20/15  0925 Oral     SpO2 05/20/15 0925 94 %     Weight 05/20/15 0925 132 lb (59.875 kg)     Height 05/20/15 0925 5\' 5"  (1.651 m)     Head Cir --      Peak Flow --      Pain Score --      Pain Loc --      Pain Edu? --      Excl. in GC? --      Constitutional: Alert and oriented. Moderate respiratory distress.Marland Kitchen HEENT   Head: Normocephalic and atraumatic.      Eyes: Conjunctivae are normal. PERRL. Normal extraocular movements.      Ears:         Nose: No congestion/rhinnorhea.   Mouth/Throat: Mucous membranes are moist.   Neck: No stridor. Cardiovascular/Chest: Normal rate, regular rhythm.  No murmurs, rubs, or gallops. Respiratory: Decreased air movement throughout, moderate rhonchi throughout all fields, moderate end expiratory wheezing in all fields. Gastrointestinal: Soft. No distention, no guarding, no rebound. Nontender.    Genitourinary/rectal:Deferred Musculoskeletal: Nontender with normal range of motion in all extremities. No joint effusions.  No lower extremity tenderness.  No edema. Neurologic:  Normal speech and language. No gross or focal neurologic deficits are appreciated. Skin:  Skin is warm, dry and intact. No rash noted. Psychiatric: Mood and affect are normal. Speech and behavior are normal. Patient exhibits appropriate insight and judgment.  ____________________________________________   EKG I, Governor Rooks, MD, the attending physician have personally viewed and interpreted all ECGs.  Normal sinus rhythm. 98 bpm. Nonspecific intraventricular conduction delay. Normal axis. Nonspecific T-wave ____________________________________________  LABS (pertinent positives/negatives)  White blood count 15.7, hemoglobin 11.0 and platelet count 394 Basic R panel without significant abnormalities Troponin less than 0.03  ____________________________________________  RADIOLOGY All Xrays were viewed by me. Imaging interpreted by Radiologist.  Two-view  chest:   IMPRESSION: 1. Mild bibasilar airspace disease in bilateral effusions superimposed on emphysema. Early infection is not excluded. 2. Borderline cardiomegaly without failure otherwise. 3. Atherosclerosis of the thoracic aortic __________________________________________  PROCEDURES  Procedure(s) performed: None  Critical Care performed: None  ____________________________________________   ED COURSE / ASSESSMENT AND PLAN  Pertinent labs & imaging results that were available during my care of the patient were reviewed by me and considered in my medical decision making (see chart for details).  This patient has chronic COPD and has been on treatment for an exacerbation with prednisone and Z-Pak since Friday. She is continuing to worsen. She does not appear to be hypoxic at rest on her home 2 L oxygen by nasal cannula, however she is pretty uncomfortable breathing, has trouble talking in full sentences and clinically has COPD exacerbation. She's not reported fever, but she has had elevated white blood cell count with x-ray reporting cannot exclude early infection. Patient has been on azithromycin, she reports an allergy to Levaquin. I will add Rocephin.  Patient was given Solu-Medrol IV as well as DuoNeb treatments here in the emergency department. Due to persistent elevated respiratory rate, and wheezes, and failed outpatient therapy, patient will be admitted to the hospital for further treatment of COPD exacerbation.    CONSULTATIONS:   Hospitalist for admission.   Patient / Family / Caregiver informed of clinical course, medical decision-making process, and agree with plan.     ___________________________________________   FINAL CLINICAL IMPRESSION(S) / ED DIAGNOSES   Final  diagnoses:  COPD exacerbation (HCC)  Bibasilar pleural effusions            Note: This dictation was prepared with Dragon dictation. Any transcriptional errors that result from this  process are unintentional   Governor Rooksebecca Ovida Delagarza, MD 05/20/15 1108

## 2015-05-20 NOTE — Care Management (Addendum)
Readmission. Per previous RNCM note: Lives alone. Daughters are Barrett HenleDorothy Brooks  [phone]) and Hewitt ShortsBrenda Reece [phone]). Last seen Dr. Elease HashimotoMaloney 2 weeks ago. No skilled nursing. Home oxygen thru [facility] 2 liters per nasal cannula, continuous x 1 year. Takes care of all basic and instrumental activities of daily living herself, drives. It appears that patient is not home bound (criteria for home health).COPD Gold in place. PT just worked with patient and patient was leading PT. I don't anticipate any RNCM needs.

## 2015-05-20 NOTE — H&P (Signed)
[facility] - [place] at [facility]   PATIENT NAME: Samantha Goodman    MR#:  [id number]  DATE OF BIRTH:  1935-12-28  DATE OF ADMISSION:  05/20/2015  PRIMARY CARE PHYSICIAN: Lorie Phenix, MD   REQUESTING/REFERRING PHYSICIAN: Dr. Governor Rooks  CHIEF COMPLAINT:   Chief Complaint  Patient presents with  . Shortness of Breath  . Cough    HISTORY OF PRESENT ILLNESS:  Samantha Goodman  is a 80 y.o. female with a known history of COPD on 2 L home oxygen, CAD status post prior stents, hypertension, hypothyroidism, congestive heart failure with diastolic dysfunction presents to the hospital secondary to worsening shortness of breath for almost 3 days now. Patient was in the hospital last month for lower GI bleed, received transfusion at the time, aspirin and Plavix which were held at discharge were restarted recently by her cardiologist. Denies any more rectal bleeding. For the last 3 days her breathing has worsened. Cough has worsened but she is only feeling clear mucoid phlegm. Called her PCP and was started on oral prednisone and azithromycin 2 days ago. Symptoms haven't improved, couldn't sleep at all last night due to dyspnea and presented to the emergency room. Chest x-ray here showing bibasilar infiltrates. So she is being admitted for COPD exacerbation with pneumonia.  PAST MEDICAL HISTORY:   Past Medical History  Diagnosis Date  . COPD (chronic obstructive pulmonary disease) (HCC)   . Asthma   . CAD (coronary artery disease)   . Unstable angina pectoris (HCC)   . Hyperlipemia   . Agitation   . Fall   . Hypothyroid   . CHF (congestive heart failure) (HCC)     Diastolic CHF, EF 40%    PAST SURGICAL HISTORY:   Past Surgical History  Procedure Laterality Date  . Eye surgery    . Abdominal hysterectomy    . Appendectomy    . Ankle surgery Right     SOCIAL HISTORY:   Social History  Substance Use Topics  . Smoking status: Former Smoker -- 0.25 packs/day  for 65 years    Quit date: 11/11/2013  . Smokeless tobacco: Never Used     Comment: Quit smoking recently  . Alcohol Use: No    FAMILY HISTORY:   Family History  Problem Relation Age of Onset  . Stroke Mother   . Prostate cancer Father   . CAD Brother   . Ovarian cancer Sister   . Breast cancer Sister   . Lung cancer Sister     DRUG ALLERGIES:   Allergies  Allergen Reactions  . Codeine Nausea And Vomiting  . Fluticasone-Salmeterol Other (See Comments)    Reaction:  Unknown   . Levofloxacin Other (See Comments)    Reaction:  Confusion and hallucinations   . Tiotropium Bromide Monohydrate Rash    REVIEW OF SYSTEMS:   Review of Systems  Constitutional: Positive for malaise/fatigue. Negative for fever, chills and weight loss.  HENT: Negative for ear discharge, ear pain, nosebleeds and tinnitus.   Eyes: Negative for blurred vision, double vision and photophobia.  Respiratory: Positive for cough, sputum production, shortness of breath and wheezing. Negative for hemoptysis.   Cardiovascular: Positive for orthopnea. Negative for chest pain, palpitations and leg swelling.  Gastrointestinal: Negative for nausea, vomiting, abdominal pain, diarrhea, constipation and melena.  Genitourinary: Negative for dysuria, urgency, frequency and hematuria.  Musculoskeletal: Negative for myalgias, back pain and neck pain.  Skin: Negative for rash.  Neurological: Negative for dizziness, tremors, sensory  change, speech change, focal weakness and headaches.  Endo/Heme/Allergies: Does not bruise/bleed easily.  Psychiatric/Behavioral: Negative for depression.    MEDICATIONS AT HOME:   Prior to Admission medications   Medication Sig Start Date End Date Taking? Authorizing Provider  acetaminophen (TYLENOL) 500 MG tablet Take 500-1,000 mg by mouth every 6 (six) hours as needed for mild pain, fever or headache.    Yes Historical Provider, MD  aspirin 81 MG tablet Take 81 mg by mouth daily.   Yes  Historical Provider, MD  atorvastatin (LIPITOR) 40 MG tablet Take 40 mg by mouth at bedtime.   Yes Historical Provider, MD  azithromycin (ZITHROMAX) 250 MG tablet Take 1-2 tablets by mouth daily. Take 2 tablets daily on day one, then 1 tablet daily until finished. 05/17/15  Yes Historical Provider, MD  cholecalciferol (VITAMIN D) 1000 UNITS tablet Take 1,000 Units by mouth daily.   Yes Historical Provider, MD  clopidogrel (PLAVIX) 75 MG tablet Take 1 tablet by mouth at bedtime.    Yes Historical Provider, MD  docusate sodium (COLACE) 250 MG capsule Take 250 mg by mouth daily.   Yes Historical Provider, MD  ferrous sulfate 325 (65 FE) MG tablet take 1 tablet by mouth every evening 03/27/15  Yes Lorie Phenix, MD  fluticasone [facility]) 50 MCG/ACT nasal spray Place 2 sprays into both nostrils daily as needed for rhinitis.   Yes Historical Provider, MD  furosemide (LASIX) 20 MG tablet Take 1 tablet (20 mg total) by mouth daily. 10/16/14  Yes Lorie Phenix, MD  guaiFENesin (MUCINEX) 600 MG 12 hr tablet Take 1 tablet (600 mg total) by mouth 2 (two) times daily. 11/17/14  Yes Gale Journey, MD  ipratropium-albuterol (DUONEB) 0.5-2.5 (3) MG/3ML SOLN Take 3 mLs by nebulization 4 (four) times daily as needed (for wheezing/shortness of breath).    Yes Historical Provider, MD  levothyroxine (SYNTHROID, LEVOTHROID) 88 MCG tablet Take 88 mcg by mouth daily before breakfast.   Yes Historical Provider, MD  LORazepam (ATIVAN) 0.5 MG tablet Take 0.5 mg by mouth every 6 (six) hours as needed for anxiety.   Yes Historical Provider, MD  metoprolol succinate (TOPROL-XL) 25 MG 24 hr tablet Take 12.5 mg by mouth daily.   Yes Historical Provider, MD  montelukast (SINGULAIR) 10 MG tablet Take 10 mg by mouth at bedtime.   Yes Historical Provider, MD  nicotine polacrilex (RA NICOTINE POLACRILEX) 4 MG gum Take 1 Dose by mouth as needed. 05/09/15 08/07/15 Yes Historical Provider, MD  pantoprazole (PROTONIX) 40 MG tablet Take 40 mg  by mouth daily.   Yes Historical Provider, MD  polyethylene glycol (MIRALAX / GLYCOLAX) packet Take 17 g by mouth daily.   Yes Historical Provider, MD  Potassium 99 MG TABS Take 99 mg by mouth daily.    Yes Historical Provider, MD  PROAIR HFA 108 [phone] Base) MCG/ACT inhaler inhale 2 puffs by mouth every 4 hours if needed for shortness of breath or wheezing 04/15/15  Yes Lorie Phenix, MD  [facility] 80-4.5 MCG/ACT inhaler inhale 2 puffs by mouth twice a day 04/08/15  Yes Lorie Phenix, MD  theophylline (THEO-24) 200 MG 24 hr capsule Take 200 mg by mouth daily.   Yes Historical Provider, MD      VITAL SIGNS:  Blood pressure 108/63, pulse 83, temperature 97.9 F (36.6 C), temperature source Oral, resp. rate 28, height 5\' 5"  (1.651 m), weight 59.875 kg (132 lb), SpO2 99 %.  PHYSICAL EXAMINATION:   Physical Exam  GENERAL:  80  y.o.-year-old patient lying in the bed with no acute distress.  EYES: Pupils equal, round, reactive to light and accommodation. No scleral icterus. Extraocular muscles intact.  HEENT: Head atraumatic, normocephalic. Oropharynx and nasopharynx clear.  NECK:  Supple, no jugular venous distention. No thyroid enlargement, no tenderness.  LUNGS:Diffuse scattered expiratory wheezing all over the lung fields, no rales,rhonchi or crepitation. No use of accessory muscles of respiration.  CARDIOVASCULAR: S1, S2 normal. No murmurs, rubs, or gallops.  ABDOMEN: Soft, nontender, nondistended. Bowel sounds present. No organomegaly or mass.  EXTREMITIES: No pedal edema, cyanosis, or clubbing.  NEUROLOGIC: Cranial nerves II through XII are intact. Muscle strength 5/5 in all extremities. Sensation intact. Gait not checked.  PSYCHIATRIC: The patient is alert and oriented x 3.  SKIN: No obvious rash, lesion, or ulcer.   LABORATORY PANEL:   CBC  Recent Labs Lab 05/20/15 0930  WBC 15.7*  HGB 11.0*  HCT 33.1*  PLT 394    ------------------------------------------------------------------------------------------------------------------  Chemistries   Recent Labs Lab 05/20/15 0930  NA 144  K 3.7  CL 102  CO2 30  GLUCOSE 98  BUN 18  CREATININE 0.84  CALCIUM 9.0   ------------------------------------------------------------------------------------------------------------------  Cardiac Enzymes  Recent Labs Lab 05/20/15 0930  TROPONINI <0.03   ------------------------------------------------------------------------------------------------------------------  RADIOLOGY:  Dg Chest 2 View  05/20/2015  CLINICAL DATA:  Shortness of breath for 4 days. Progression despite steroids and antibiotics. EXAM: CHEST - 2 VIEW COMPARISON:  CT the chest 03/05/2015. FINDINGS: The heart is mildly enlarged. Bilateral lower lobe airspace disease has increased since the prior study. Small bilateral pleural effusions are noted as well. Atherosclerotic calcifications are present at the thoracic aorta. No significant upper lobe consolidation is present. Severe emphysematous changes are again noted. Exaggerated kyphosis of the thoracic spine and remote fractures are stable. IMPRESSION: 1. Mild bibasilar airspace disease in bilateral effusions superimposed on emphysema. Early infection is not excluded. 2. Borderline cardiomegaly without failure otherwise. 3. Atherosclerosis of the thoracic aortic Electronically Signed   By: Marin Robertshristopher  Mattern M.D.   On: 05/20/2015 10:18    EKG:   Orders placed or performed during the hospital encounter of 05/20/15  . ED EKG  . ED EKG  . EKG 12-Lead  . EKG 12-Lead    IMPRESSION AND PLAN:   Samantha Goodman  is a 80 y.o. female with a known history of COPD on 2 L home oxygen, CAD status post prior stents, hypertension, hypothyroidism, congestive heart failure with diastolic dysfunction presents to the hospital secondary to worsening shortness of breath for almost 3 days now.  #1 acute on  chronic COPD exacerbation-currently requiring only 2 L oxygen. -Dyspneic and also significant wheezing on exam. -Started IV Solu-Medrol, DuoNeb scheduled, continue her inhalers.  #2 community-acquired pneumonia-added Rocephin for azithromycin. -Follow blood cultures.  #3 leukocytosis-sided pneumonia and also on steroids as outpatient.  #4 chronic anemia-recent admission for GI bleed last month and received transfusion. Restarted back on aspirin and Plavix by cardiology. Hemoglobin is stable at this time. Continue to monitor.  #4 diastolic CHF-appears stable. No fluid overload. Continue Lasix and potassium supplements daily.  #5 GERD-continue Protonix  #6 DVT prophylaxis-on Lovenox    All the records are reviewed and case discussed with ED provider. Management plans discussed with the patient, family and they are in agreement.  CODE STATUS: Full code  TOTAL TIME TAKING CARE OF THIS PATIENT: 50 minutes.    Enid BaasKALISETTI,Orella Cushman M.D on 05/20/2015 at 12:15 PM  Between 7am to 6pm - Pager - [phone]  After 6pm go to [email or web address] - password [email or web address] [facility]  [facility]  Office  [phone]  CC: Primary care physician; Lorie Phenix, MD

## 2015-05-20 NOTE — Evaluation (Signed)
Physical Therapy Evaluation Patient Details Name: Samantha Goodman MRN: [id number] DOB: 1935/11/15 Today's Date: 05/20/2015   History of Present Illness  Patient is a 80 y/o female that presents with progressive shortness of breath. Found to have COPD exacerbation and pneumonia.   Clinical Impression  Patient is a very pleasant 80 y/o female that remains quite active on home on 2L of O2. She presents with progressive shortness of breath however today she appears to be at her physical baseline. She is able to ambulate 2 laps around RN station maintaining O2 sats > 97% on 2L of O2 and excellent gait speed with no loss of balance with turns, navigating hallways, etc. Patient demonstrates no skilled PT needs at this time, will discharge from acute PT list.     Follow Up Recommendations No PT follow up    Equipment Recommendations       Recommendations for Other Services       Precautions / Restrictions Precautions Precautions: None Restrictions Weight Bearing Restrictions: No      Mobility  Bed Mobility Overal bed mobility: Independent                Transfers Overall transfer level: Independent                  Ambulation/Gait Ambulation/Gait assistance: Independent Ambulation Distance (Feet): 360 Feet Assistive device: None Gait Pattern/deviations: WFL(Within Functional Limits)   Gait velocity interpretation: at or above normal speed for age/gender General Gait Details: O2 sats remained above 97% on 2L of O2, no balance or gait deficits identified.   Stairs            Wheelchair Mobility    Modified Rankin (Stroke Patients Only)       Balance Overall balance assessment: Independent                                           Pertinent Vitals/Pain Pain Assessment: No/denies pain    Home Living Family/patient expects to be discharged to:: Private residence Living Arrangements: Alone Available Help at Discharge: Family;Available  PRN/intermittently Type of Home: House Home Access: Level entry     Home Layout: One level   Additional Comments: Home O2 (2L continuous)     Prior Function Level of Independence: Independent         Comments: Indep with household and community mobility without assist device; + driving; denies fall history.     Hand Dominance        Extremity/Trunk Assessment   Upper Extremity Assessment: Overall WFL for tasks assessed           Lower Extremity Assessment: Overall WFL for tasks assessed         Communication   Communication: HOH  Cognition Arousal/Alertness: Awake/alert Behavior During Therapy: WFL for tasks assessed/performed Overall Cognitive Status: Within Functional Limits for tasks assessed                      General Comments General comments (skin integrity, edema, etc.): Bruising bilateral UEs, appears to be from fragile skin.     Exercises        Assessment/Plan    PT Assessment Patent does not need any further PT services  PT Diagnosis Difficulty walking   PT Problem List    PT Treatment Interventions     PT Goals (Current goals can be  found in the Care Plan section) Acute Rehab PT Goals Patient Stated Goal: To return home  PT Goal Formulation: With patient Time For Goal Achievement: 06/03/15 Potential to Achieve Goals: Good    Frequency     Barriers to discharge        Co-evaluation               End of Session Equipment Utilized During Treatment: Gait belt;Oxygen Activity Tolerance: Patient tolerated treatment well Patient left: in chair;with call bell/phone within reach;with chair alarm set Nurse Communication: Mobility status         Time: [id number] PT Time Calculation (min) (ACUTE ONLY): 12 min   Charges:   PT Evaluation $PT Eval Low Complexity: 1 Procedure     PT G Codes:       Kerin RansomPatrick A Laisa Larrick, PT, DPT    05/20/2015, 3:43 PM

## 2015-05-20 NOTE — Progress Notes (Signed)
Patient admitted to unit from ED for SOB and generalized weakness, patient alert and oriented, denies any pain at this time. Patient oxygen dependent on 2L [place], sat 100, MD notified.

## 2015-05-20 NOTE — ED Notes (Signed)
Pt states last week she began coughing, having shob, MD put her on prednisone and antibiotic and pt states no change. Pt with junky cough in triage, shob, sats 95% on 2L home O2, has COPD. Pt winded, pursed lip breathing. Exp.wheezing noted.

## 2015-05-20 NOTE — Progress Notes (Signed)
PT Cancellation Note  Patient Details Name: Samantha Goodman MRN: [id number] DOB: 05/12/1935   Cancelled Treatment:    Reason Eval/Treat Not Completed: Other (comment). Patient is currently having lunch and asked PT to return later. PT will re-attempt as appropriate and time allows.   Kerin RansomPatrick A McNamara, PT, DPT    05/20/2015, 1:50 PM

## 2015-05-20 NOTE — Progress Notes (Signed)
ANTIBIOTIC CONSULT NOTE - INITIAL  Pharmacy Consult for Rocephin  Indication: pneumonia  Allergies  Allergen Reactions  . Codeine Nausea And Vomiting  . Fluticasone-Salmeterol Other (See Comments)    Reaction:  Unknown   . Levofloxacin Other (See Comments)    Reaction:  Confusion and hallucinations   . Tiotropium Bromide Monohydrate Rash    Patient Measurements: Height:  (165.1 cm) Weight: 132 lb (59.875 kg) IBW/kg (Calculated) : 57 Adjusted Body Weight:   Vital Signs: Temp: 97.5 F (36.4 C) (05/08 1321) Temp Source: Oral (05/08 1321) BP: 115/48 mmHg (05/08 1321) Pulse Rate: 87 (05/08 1321) Intake/Output from previous day:   Intake/Output from this shift:    Labs:  Recent Labs  05/20/15 0930  WBC 15.7*  HGB 11.0*  PLT 394  CREATININE 0.84   Estimated Creatinine Clearance: 50.5 mL/min (by C-G formula based on Cr of 0.84). No results for input(s): VANCOTROUGH, VANCOPEAK, VANCORANDOM, GENTTROUGH, GENTPEAK, GENTRANDOM, TOBRATROUGH, TOBRAPEAK, TOBRARND, AMIKACINPEAK, AMIKACINTROU, AMIKACIN in the last 72 hours.   Microbiology: No results found for this or any previous visit (from the past 720 hour(s)).  Medical History: Past Medical History  Diagnosis Date  . COPD (chronic obstructive pulmonary disease) (HCC)   . Asthma   . CAD (coronary artery disease)   . Unstable angina pectoris (HCC)   . Hyperlipemia   . Agitation   . Fall   . Hypothyroid   . CHF (congestive heart failure) (HCC)     Diastolic CHF, EF 13%    Medications:  Prescriptions prior to admission  Medication Sig Dispense Refill Last Dose  . acetaminophen (TYLENOL) 500 MG tablet Take 500-1,000 mg by mouth every 6 (six) hours as needed for mild pain, fever or headache.    prn  . aspirin 81 MG tablet Take 81 mg by mouth daily.   05/20/2015 at 0530  . atorvastatin (LIPITOR) 40 MG tablet Take 40 mg by mouth at bedtime.   05/19/2015 at Unknown time  . azithromycin (ZITHROMAX) 250 MG tablet Take  1-2 tablets by mouth daily. Take 2 tablets daily on day one, then 1 tablet daily until finished.  0 05/20/2015 at Unknown time  . cholecalciferol (VITAMIN D) 1000 UNITS tablet Take 1,000 Units by mouth daily.   05/20/2015 at Unknown time  . clopidogrel (PLAVIX) 75 MG tablet Take 1 tablet by mouth at bedtime.    05/19/2015 at 1930  . docusate sodium (COLACE) 250 MG capsule Take 250 mg by mouth daily.   05/20/2015 at Unknown time  . ferrous sulfate 325 (65 FE) MG tablet take 1 tablet by mouth every evening 90 tablet 1 05/19/2015 at Unknown time  . fluticasone (FLONASE) 50 MCG/ACT nasal spray Place 2 sprays into both nostrils daily as needed for rhinitis.   prn  . furosemide (LASIX) 20 MG tablet Take 1 tablet (20 mg total) by mouth daily. 90 tablet 3 05/20/2015 at Unknown time  . guaiFENesin (MUCINEX) 600 MG 12 hr tablet Take 1 tablet (600 mg total) by mouth 2 (two) times daily. 20 tablet 0 05/20/2015 at Unknown time  . ipratropium-albuterol (DUONEB) 0.5-2.5 (3) MG/3ML SOLN Take 3 mLs by nebulization 4 (four) times daily as needed (for wheezing/shortness of breath).    prn  . levothyroxine (SYNTHROID, LEVOTHROID) 88 MCG tablet Take 88 mcg by mouth daily before breakfast.   05/20/2015 at Unknown time  . LORazepam (ATIVAN) 0.5 MG tablet Take 0.5 mg by mouth every 6 (six) hours as needed for anxiety.   prn  .  metoprolol succinate (TOPROL-XL) 25 MG 24 hr tablet Take 12.5 mg by mouth daily.   05/20/2015 at 0530  . montelukast (SINGULAIR) 10 MG tablet Take 10 mg by mouth at bedtime.   05/19/2015 at Unknown time  . nicotine polacrilex (RA NICOTINE POLACRILEX) 4 MG gum Take 1 Dose by mouth as needed.   prn  . pantoprazole (PROTONIX) 40 MG tablet Take 40 mg by mouth daily.   05/20/2015 at Unknown time  . polyethylene glycol (MIRALAX / GLYCOLAX) packet Take 17 g by mouth daily.   05/20/2015 at Unknown time  . Potassium 99 MG TABS Take 99 mg by mouth daily.    05/20/2015 at Unknown time  . PROAIR HFA 108 (90 Base) MCG/ACT inhaler inhale 2  puffs by mouth every 4 hours if needed for shortness of breath or wheezing 1 Inhaler 5 prn  . SYMBICORT 80-4.5 MCG/ACT inhaler inhale 2 puffs by mouth twice a day 10.2 Inhaler 5 05/20/2015 at Unknown time  . theophylline (THEO-24) 200 MG 24 hr capsule Take 200 mg by mouth daily.   05/20/2015 at Unknown time   Scheduled:  . albuterol      . [START ON 05/21/2015] aspirin  81 mg Oral Daily  . atorvastatin  40 mg Oral QHS  . azithromycin  500 mg Intravenous Q24H  . [START ON 05/21/2015] cefTRIAXone (ROCEPHIN)  IV  1 g Intravenous Q24H  . [START ON 05/21/2015] cholecalciferol  1,000 Units Oral Daily  . clopidogrel  75 mg Oral QHS  . docusate sodium  100 mg Oral BID  . enoxaparin (LOVENOX) injection  40 mg Subcutaneous Q24H  . ferrous sulfate  325 mg Oral QPM  . [START ON 05/21/2015] furosemide  20 mg Oral Daily  . guaiFENesin  600 mg Oral BID  . ipratropium-albuterol  3 mL Nebulization Q4H  . [START ON 05/21/2015] levothyroxine  88 mcg Oral QAC breakfast  . methylPREDNISolone (SOLU-MEDROL) injection  60 mg Intravenous Q8H  . [START ON 05/21/2015] metoprolol succinate  12.5 mg Oral Daily  . mometasone-formoterol  2 puff Inhalation BID  . montelukast  10 mg Oral QHS  . [START ON 05/21/2015] pantoprazole  40 mg Oral Daily  . potassium chloride  20 mEq Oral Daily  . sodium chloride flush  3 mL Intravenous Q12H  . [START ON 05/21/2015] theophylline  200 mg Oral Daily   Assessment:  Goal of Therapy:  Resolution of infection   Plan:  Will start Rocephin 1 g IV q24 hours. Patient also on Azithromycin therapy.   Samantha Goodman D 05/20/2015,3:25 PM

## 2015-05-21 LAB — BASIC METABOLIC PANEL
Anion gap: 9 (ref 5–15)
BUN: 21 mg/dL — AB (ref 6–20)
CO2: 33 mmol/L — AB (ref 22–32)
Calcium: 9 mg/dL (ref 8.9–10.3)
Chloride: 102 mmol/L (ref 101–111)
Creatinine, Ser: 0.74 mg/dL (ref 0.44–1.00)
GFR calc Af Amer: >60 mL/min (ref >60)
GFR calc non Af Amer: >60 mL/min (ref >60)
GLUCOSE: 127 mg/dL — AB (ref 65–99)
POTASSIUM: 3.7 mmol/L (ref 3.5–5.1)
Sodium: 144 mmol/L (ref 135–145)

## 2015-05-21 LAB — CBC
HEMATOCRIT: 28.7 % — AB (ref 35.0–47.0)
HEMOGLOBIN: 9.5 g/dL — AB (ref 12.0–16.0)
MCH: 27.3 pg (ref 26.0–34.0)
MCHC: 33.2 g/dL (ref 32.0–36.0)
MCV: 82.3 fL (ref 80.0–100.0)
Platelets: 352 10*3/uL (ref 150–440)
RBC: 3.49 MIL/uL — AB (ref 3.80–5.20)
RDW: 14.6 % — ABNORMAL HIGH (ref 11.5–14.5)
WBC: 8.3 10*3/uL (ref 3.6–11.0)

## 2015-05-21 MED ORDER — AZITHROMYCIN 250 MG PO TABS
500.0000 mg | ORAL_TABLET | Freq: Every day | ORAL | Status: AC
  Administered 2015-05-21 – 2015-05-22 (×2): 500 mg via ORAL
  Filled 2015-05-21 (×2): qty 2

## 2015-05-21 NOTE — Progress Notes (Signed)
[facility] - [place] at [facility]   PATIENT NAME: Samantha Goodman    MR#:  [id number]  DATE OF BIRTH:  May 02, 1935  SUBJECTIVE:  CHIEF COMPLAINT:   Chief Complaint  Patient presents with  . Shortness of Breath  . Cough   Still cough, sputum, wheezing and SOB, on O2 [place] 2L. REVIEW OF SYSTEMS:  CONSTITUTIONAL: No fever, fatigue or weakness.  EYES: No blurred or double vision.  EARS, NOSE, AND THROAT: No tinnitus or ear pain.  RESPIRATORY: has cough, shortness of breath, wheezing but no hemoptysis.  CARDIOVASCULAR: No chest pain, orthopnea, edema.  GASTROINTESTINAL: No nausea, vomiting, diarrhea or abdominal pain.  GENITOURINARY: No dysuria, hematuria.  ENDOCRINE: No polyuria, nocturia,  HEMATOLOGY: No anemia, easy bruising or bleeding SKIN: No rash or lesion. MUSCULOSKELETAL: No joint pain or arthritis.   NEUROLOGIC: No tingling, numbness, weakness.  PSYCHIATRY: No anxiety or depression.   DRUG ALLERGIES:   Allergies  Allergen Reactions  . Codeine Nausea And Vomiting  . Fluticasone-Salmeterol Other (See Comments)    Reaction:  Unknown   . Levofloxacin Other (See Comments)    Reaction:  Confusion and hallucinations   . Tiotropium Bromide Monohydrate Rash    VITALS:  Blood pressure 109/47, pulse 72, temperature 97.9 F (36.6 C), temperature source Oral, resp. rate 20, height  (1.651 m), weight 59.875 kg (132 lb), SpO2 96 %.  PHYSICAL EXAMINATION:  GENERAL:  80 y.o.-year-old patient lying in the bed with no acute distress.  EYES: Pupils equal, round, reactive to light and accommodation. No scleral icterus. Extraocular muscles intact.  HEENT: Head atraumatic, normocephalic. Oropharynx and nasopharynx clear.  NECK:  Supple, no jugular venous distention. No thyroid enlargement, no tenderness.  LUNGS: Normal breath sounds bilaterally, moderate to severe expiratory wheezing and rhonchi. No use of accessory muscles of respiration.  CARDIOVASCULAR: S1,  S2 normal. No murmurs, rubs, or gallops.  ABDOMEN: Soft, nontender, nondistended. Bowel sounds present. No organomegaly or mass.  EXTREMITIES: No pedal edema, cyanosis, or clubbing.  NEUROLOGIC: Cranial nerves II through XII are intact. Muscle strength 5/5 in all extremities. Sensation intact. Gait not checked.  PSYCHIATRIC: The patient is alert and oriented x 3.  SKIN: No obvious rash, lesion, or ulcer.    LABORATORY PANEL:   CBC  Recent Labs Lab 05/21/15 0358  WBC 8.3  HGB 9.5*  HCT 28.7*  PLT 352   ------------------------------------------------------------------------------------------------------------------  Chemistries   Recent Labs Lab 05/21/15 0358  NA 144  K 3.7  CL 102  CO2 33*  GLUCOSE 127*  BUN 21*  CREATININE 0.74  CALCIUM 9.0   ------------------------------------------------------------------------------------------------------------------  Cardiac Enzymes  Recent Labs Lab 05/20/15 0930  TROPONINI <0.03   ------------------------------------------------------------------------------------------------------------------  RADIOLOGY:  Dg Chest 2 View  05/20/2015  CLINICAL DATA:  Shortness of breath for 4 days. Progression despite steroids and antibiotics. EXAM: CHEST - 2 VIEW COMPARISON:  CT the chest 03/05/2015. FINDINGS: The heart is mildly enlarged. Bilateral lower lobe airspace disease has increased since the prior study. Small bilateral pleural effusions are noted as well. Atherosclerotic calcifications are present at the thoracic aorta. No significant upper lobe consolidation is present. Severe emphysematous changes are again noted. Exaggerated kyphosis of the thoracic spine and remote fractures are stable. IMPRESSION: 1. Mild bibasilar airspace disease in bilateral effusions superimposed on emphysema. Early infection is not excluded. 2. Borderline cardiomegaly without failure otherwise. 3. Atherosclerosis of the thoracic aortic Electronically Signed    By: Marin Roberts M.D.   On: 05/20/2015 10:18  EKG:   Orders placed or performed during the hospital encounter of 05/20/15  . ED EKG  . ED EKG  . EKG 12-Lead  . EKG 12-Lead    ASSESSMENT AND PLAN:   Samantha Goodman is a 80 y.o. female with a known history of COPD on 2 L home oxygen, CAD status post prior stents, hypertension, hypothyroidism, congestive heart failure with diastolic dysfunction presents to the hospital secondary to worsening shortness of breath for almost 3 days now.  #1 acute on chronic COPD exacerbation Continue 2 L oxygen,  IV Solu-Medrol, DuoNeb scheduled, continue her inhalers and mucinex.  #2 community-acquired pneumonia. continue Rocephin for azithromycin. -Follow blood cultures.  #3 leukocytosis-sided pneumonia and also on steroids as outpatient. Improved.  #4 chronic anemia-recent admission for GI bleed last month and received transfusion. Restarted back on aspirin and Plavix by cardiology. Hemoglobin is stable at this time. Continue to monitor.  #4 Chronic diastolic CHF-appears stable. No fluid overload. Continue Lasix and potassium supplements daily.  #5 GERD-continue Protonix   All the records are reviewed and case discussed with Care Management/Social Workerr. Management plans discussed with the patient, family and they are in agreement.  CODE STATUS: full code.  TOTAL TIME TAKING CARE OF THIS PATIENT: 39 minutes.  Greater than 50% time was spent on coordination of care and face-to-face counseling.  POSSIBLE D/C IN 3 DAYS, DEPENDING ON CLINICAL CONDITION.   Shaune Pollackhen, Hiroyuki Ozanich M.D on 05/21/2015 at 1:52 PM  Between 7am to 6pm - Pager - [phone]  After 6pm go to [email or web address] - password [email or web address] [facility]  [facility]  Office  [phone]  CC: Primary care physician; Lorie PhenixNancy Maloney, MD

## 2015-05-22 MED ORDER — METHYLPREDNISOLONE SODIUM SUCC 40 MG IJ SOLR
40.0000 mg | Freq: Two times a day (BID) | INTRAMUSCULAR | Status: DC
  Administered 2015-05-22 – 2015-05-23 (×2): 40 mg via INTRAVENOUS
  Filled 2015-05-22 (×2): qty 1

## 2015-05-22 NOTE — Care Management Important Message (Signed)
Important Message  Patient Details  Name: Samantha Goodman MRN: [id number] Date of Birth: 10/19/1935   Medicare Important Message Given:  Yes    Olegario MessierKathy A Lailanie Goodman 05/22/2015, 9:56 AM

## 2015-05-22 NOTE — Progress Notes (Signed)
[facility] [place] [facility]   PATIENT NAME: Samantha Goodman    MR#:  [id number]  DATE OF BIRTH:  05/14/1935  SUBJECTIVE:  CHIEF COMPLAINT:   Chief Complaint  Patient presents with  . Shortness of Breath  . Cough   better cough, sputum, wheezing and SOB, on O2 [place]  2L. REVIEW OF SYSTEMS:  CONSTITUTIONAL: No fever, fatigue or weakness.  EYES: No blurred or double vision.  EARS, NOSE, AND THROAT: No tinnitus or ear pain.  RESPIRATORY: has cough, shortness of breath, wheezing but no hemoptysis.  CARDIOVASCULAR: No chest pain, orthopnea, edema.  GASTROINTESTINAL: No nausea, vomiting, diarrhea or abdominal pain.  GENITOURINARY: No dysuria, hematuria.  ENDOCRINE: No polyuria, nocturia,  HEMATOLOGY: No anemia, easy bruising or bleeding SKIN: No rash or lesion. MUSCULOSKELETAL: No joint pain or arthritis.   NEUROLOGIC: No tingling, numbness, weakness.  PSYCHIATRY: No anxiety or depression.   DRUG ALLERGIES:   Allergies  Allergen Reactions  . Codeine Nausea And Vomiting  . Fluticasone-Salmeterol Other (See Comments)    Reaction:  Unknown   . Levofloxacin Other (See Comments)    Reaction:  Confusion and hallucinations   . Tiotropium Bromide Monohydrate Rash    VITALS:  Blood pressure 116/51, pulse 81, temperature 97.9 F (36.6 C), temperature source Oral, resp. rate 20, height 5\' 5"  (1.651 m), weight 59.875 kg (132 lb), SpO2 97 %.  PHYSICAL EXAMINATION:  GENERAL:  80 y.o.-year-old patient lying in the bed with no acute distress.  EYES: Pupils equal, round, reactive to light and accommodation. No scleral icterus. Extraocular muscles intact.  HEENT: Head atraumatic, normocephalic. Oropharynx and nasopharynx clear.  NECK:  Supple, no jugular venous distention. No thyroid enlargement, no tenderness.  LUNGS: Normal breath sounds bilaterally, mild expiratory wheezing and rhonchi. No use of accessory muscles of respiration.  CARDIOVASCULAR: S1, S2 normal.  No murmurs, rubs, or gallops.  ABDOMEN: Soft, nontender, nondistended. Bowel sounds present. No organomegaly or mass.  EXTREMITIES: No pedal edema, cyanosis, or clubbing.  NEUROLOGIC: Cranial nerves II through XII are intact. Muscle strength 5/5 in all extremities. Sensation intact. Gait not checked.  PSYCHIATRIC: The patient is alert and oriented x 3.  SKIN: No obvious rash, lesion, or ulcer.    LABORATORY PANEL:   CBC  Recent Labs Lab 05/21/15 0358  WBC 8.3  HGB 9.5*  HCT 28.7*  PLT 352   ------------------------------------------------------------------------------------------------------------------  Chemistries   Recent Labs Lab 05/21/15 0358  NA 144  K 3.7  CL 102  CO2 33*  GLUCOSE 127*  BUN 21*  CREATININE 0.74  CALCIUM 9.0   ------------------------------------------------------------------------------------------------------------------  Cardiac Enzymes  Recent Labs Lab 05/20/15 0930  TROPONINI <0.03   ------------------------------------------------------------------------------------------------------------------  RADIOLOGY:  No results found.  EKG:   Orders placed or performed during the hospital encounter of 05/20/15  . ED EKG  . ED EKG  . EKG 12-Lead  . EKG 12-Lead    ASSESSMENT AND PLAN:   Samantha KhatMary Wescoat is a 80 y.o. female with a known history of COPD on 2 L home oxygen, CAD status post prior stents, hypertension, hypothyroidism, congestive heart failure with diastolic dysfunction presents to the hospital secondary to worsening shortness of breath for almost 3 days now.  #1 acute on chronic COPD exacerbation Continue 2 L oxygen, taper IV Solu-Medrol, DuoNeb scheduled, continue her inhalers and mucinex.  #2 community-acquired pneumonia. continue Rocephin for azithromycin.  #3 leukocytosis-sided pneumonia and also on steroids as outpatient. Improved.  #4 chronic anemia-recent admission for GI bleed  last month and received  transfusion. Restarted back on aspirin and Plavix by cardiology. Hemoglobin is stable at this time. Continue to monitor.  #4 Chronic diastolic CHF-appears stable. No fluid overload. Continue Lasix and potassium supplements daily.  #5 GERD-continue Protonix   All the records are reviewed and case discussed with Care Management/Social Workerr. Management plans discussed with the patient, family and they are in agreement.  CODE STATUS: full code.  TOTAL TIME TAKING CARE OF THIS PATIENT: 33 minutes.  Greater than 50% time was spent on coordination of care and face-to-face counseling.  POSSIBLE D/C IN 2 DAYS, DEPENDING ON CLINICAL CONDITION.   Shaune Pollack M.D on 05/22/2015 at 11:52 AM  Between 7am to 6pm - Pager - [phone]  After 6pm go to [email or web address] - password [email or web address] [facility]  [facility]  Office  [phone]  CC: Primary care physician; Lorie Phenix, MD

## 2015-05-23 MED ORDER — AZITHROMYCIN 250 MG PO TABS: 250.0000 mg | ORAL_TABLET | Freq: Every day | ORAL | Status: DC

## 2015-05-23 MED ORDER — CEFUROXIME AXETIL 250 MG PO TABS: 250.0000 mg | ORAL_TABLET | Freq: Two times a day (BID) | ORAL | Status: DC

## 2015-05-23 MED ORDER — PREDNISONE 10 MG (21) PO TBPK: ORAL_TABLET | ORAL | Status: DC

## 2015-05-23 NOTE — Progress Notes (Signed)
All discharged paper explained and explained to pt. Pt verbalizes understanding.  RX given to patient and pt wearing home oxygen. IV d/c intact. Telemetry d/c [facility] notified

## 2015-05-23 NOTE — Progress Notes (Signed)
ANTIBIOTIC CONSULT NOTE - Follow Up  Pharmacy Consult for Rocephin  Indication: pneumonia  Allergies  Allergen Reactions  . Codeine Nausea And Vomiting  . Fluticasone-Salmeterol Other (See Comments)    Reaction:  Unknown   . Levofloxacin Other (See Comments)    Reaction:  Confusion and hallucinations   . Tiotropium Bromide Monohydrate Rash    Patient Measurements: Height:  (165.1 cm) Weight: 132 lb (59.875 kg) IBW/kg (Calculated) : 57  Vital Signs: Temp: 97.7 F (36.5 C) (05/11 0341) Temp Source: Oral (05/11 0341) BP: 108/53 mmHg (05/11 0341) Pulse Rate: 72 (05/11 0341) Intake/Output from previous day: 05/10 0701 - 05/11 0700 In: 480 [P.O.:480] Out: -  Intake/Output from this shift:    Labs:  Recent Labs  05/20/15 0930 05/21/15 0358  WBC 15.7* 8.3  HGB 11.0* 9.5*  PLT 394 352  CREATININE 0.84 0.74   Estimated Creatinine Clearance: 53 mL/min (by C-G formula based on Cr of 0.74). No results for input(s): VANCOTROUGH, VANCOPEAK, VANCORANDOM, GENTTROUGH, GENTPEAK, GENTRANDOM, TOBRATROUGH, TOBRAPEAK, TOBRARND, AMIKACINPEAK, AMIKACINTROU, AMIKACIN in the last 72 hours.   Microbiology: No results found for this or any previous visit (from the past 720 hour(s)).  Medical History: Past Medical History  Diagnosis Date  . COPD (chronic obstructive pulmonary disease) (HCC)   . Asthma   . CAD (coronary artery disease)   . Unstable angina pectoris (HCC)   . Hyperlipemia   . Agitation   . Fall   . Hypothyroid   . CHF (congestive heart failure) (HCC)     Diastolic CHF, EF 16%    Medications:  Prescriptions prior to admission  Medication Sig Dispense Refill Last Dose  . acetaminophen (TYLENOL) 500 MG tablet Take 500-1,000 mg by mouth every 6 (six) hours as needed for mild pain, fever or headache.    prn  . aspirin 81 MG tablet Take 81 mg by mouth daily.   05/20/2015 at 0530  . atorvastatin (LIPITOR) 40 MG tablet Take 40 mg by mouth at bedtime.   05/19/2015 at  Unknown time  . azithromycin (ZITHROMAX) 250 MG tablet Take 1-2 tablets by mouth daily. Take 2 tablets daily on day one, then 1 tablet daily until finished.  0 05/20/2015 at Unknown time  . cholecalciferol (VITAMIN D) 1000 UNITS tablet Take 1,000 Units by mouth daily.   05/20/2015 at Unknown time  . clopidogrel (PLAVIX) 75 MG tablet Take 1 tablet by mouth at bedtime.    05/19/2015 at 1930  . docusate sodium (COLACE) 250 MG capsule Take 250 mg by mouth daily.   05/20/2015 at Unknown time  . ferrous sulfate 325 (65 FE) MG tablet take 1 tablet by mouth every evening 90 tablet 1 05/19/2015 at Unknown time  . fluticasone (FLONASE) 50 MCG/ACT nasal spray Place 2 sprays into both nostrils daily as needed for rhinitis.   prn  . furosemide (LASIX) 20 MG tablet Take 1 tablet (20 mg total) by mouth daily. 90 tablet 3 05/20/2015 at Unknown time  . guaiFENesin (MUCINEX) 600 MG 12 hr tablet Take 1 tablet (600 mg total) by mouth 2 (two) times daily. 20 tablet 0 05/20/2015 at Unknown time  . ipratropium-albuterol (DUONEB) 0.5-2.5 (3) MG/3ML SOLN Take 3 mLs by nebulization 4 (four) times daily as needed (for wheezing/shortness of breath).    prn  . levothyroxine (SYNTHROID, LEVOTHROID) 88 MCG tablet Take 88 mcg by mouth daily before breakfast.   05/20/2015 at Unknown time  . LORazepam (ATIVAN) 0.5 MG tablet Take 0.5 mg by  mouth every 6 (six) hours as needed for anxiety.   prn  . metoprolol succinate (TOPROL-XL) 25 MG 24 hr tablet Take 12.5 mg by mouth daily.   05/20/2015 at 0530  . montelukast (SINGULAIR) 10 MG tablet Take 10 mg by mouth at bedtime.   05/19/2015 at Unknown time  . nicotine polacrilex (RA NICOTINE POLACRILEX) 4 MG gum Take 1 Dose by mouth as needed.   prn  . pantoprazole (PROTONIX) 40 MG tablet Take 40 mg by mouth daily.   05/20/2015 at Unknown time  . polyethylene glycol (MIRALAX / GLYCOLAX) packet Take 17 g by mouth daily.   05/20/2015 at Unknown time  . Potassium 99 MG TABS Take 99 mg by mouth daily.    05/20/2015 at  Unknown time  . PROAIR HFA 108 (90 Base) MCG/ACT inhaler inhale 2 puffs by mouth every 4 hours if needed for shortness of breath or wheezing 1 Inhaler 5 prn  . SYMBICORT 80-4.5 MCG/ACT inhaler inhale 2 puffs by mouth twice a day 10.2 Inhaler 5 05/20/2015 at Unknown time  . theophylline (THEO-24) 200 MG 24 hr capsule Take 200 mg by mouth daily.   05/20/2015 at Unknown time   Scheduled:  . aspirin  81 mg Oral Daily  . atorvastatin  40 mg Oral QHS  . cefTRIAXone (ROCEPHIN)  IV  1 g Intravenous Q24H  . cholecalciferol  1,000 Units Oral Daily  . clopidogrel  75 mg Oral QHS  . docusate sodium  100 mg Oral BID  . enoxaparin (LOVENOX) injection  40 mg Subcutaneous Q24H  . ferrous sulfate  325 mg Oral QPM  . furosemide  20 mg Oral Daily  . guaiFENesin  600 mg Oral BID  . ipratropium-albuterol  3 mL Nebulization Q4H  . levothyroxine  88 mcg Oral QAC breakfast  . methylPREDNISolone (SOLU-MEDROL) injection  40 mg Intravenous Q12H  . metoprolol succinate  12.5 mg Oral Daily  . mometasone-formoterol  2 puff Inhalation BID  . montelukast  10 mg Oral QHS  . pantoprazole  40 mg Oral Daily  . sodium chloride flush  3 mL Intravenous Q12H  . theophylline  200 mg Oral Daily   Assessment:  Goal of Therapy:  Resolution of infection   Plan:  Will start Rocephin 1 g IV q24 hours. Patient completed Azithromycin therapy.   Wylie Russon G 05/23/2015,8:16 AM

## 2015-05-23 NOTE — Care Management (Signed)
Follow up appointment made on 05/31/15 at 10:30AM with patient PCP Dr. Lorie PhenixNancy Maloney. No further RNCM needs.

## 2015-05-25 NOTE — Discharge Summary (Signed)
[facility] [place] at [facility]   PATIENT NAME: Samantha Goodman    MR#:  [id number]  DATE OF BIRTH:  04/16/1935  DATE OF ADMISSION:  05/20/2015 ADMITTING PHYSICIAN: Enid Baasadhika Kalisetti, MD  DATE OF DISCHARGE: 05/23/2015 12:18 PM  PRIMARY CARE PHYSICIAN: Lorie PhenixNancy Maloney, MD    ADMISSION DIAGNOSIS:  COPD exacerbation (HCC) [J44.1]  DISCHARGE DIAGNOSIS:  Active Problems:   COPD exacerbation (HCC)   SECONDARY DIAGNOSIS:   Past Medical History  Diagnosis Date  . COPD (chronic obstructive pulmonary disease) (HCC)   . Asthma   . CAD (coronary artery disease)   . Unstable angina pectoris (HCC)   . Hyperlipemia   . Agitation   . Fall   . Hypothyroid   . CHF (congestive heart failure) (HCC)     Diastolic CHF, EF 08%55%    HOSPITAL COURSE:   #1 acute on chronic COPD exacerbation Continue 2 L oxygen, taper IV Solu-Medrol, DuoNeb scheduled, continue her inhalers and mucinex.   Improved, d/c on tapering steroids.  #2 community-acquired pneumonia. continue Rocephin for azithromycin.  #3 leukocytosis-sided pneumonia and also on steroids as outpatient. Improved.  #4 chronic anemia-recent admission for GI bleed last month and received transfusion. Restarted back on aspirin and Plavix by cardiology. Hemoglobin is stable at this time. Continue to monitor.  #4 Chronic diastolic CHF-appears stable. No fluid overload. Continue Lasix and potassium supplements daily.  #5 GERD-continue Protonix  DISCHARGE CONDITIONS:   Stable.  CONSULTS OBTAINED:     DRUG ALLERGIES:   Allergies  Allergen Reactions  . Codeine Nausea And Vomiting  . Fluticasone-Salmeterol Other (See Comments)    Reaction:  Unknown   . Levofloxacin Other (See Comments)    Reaction:  Confusion and hallucinations   . Tiotropium Bromide Monohydrate Rash    DISCHARGE MEDICATIONS:   Discharge Medication List as of 05/23/2015 11:40 AM    START taking these medications   Details  cefUROXime  (CEFTIN) 250 MG tablet Take 1 tablet (250 mg total) by mouth 2 (two) times daily with a meal., Starting 05/23/2015, Until Discontinued, Print    predniSONE (STERAPRED UNI-PAK 21 TAB) 10 MG (21) TBPK tablet Take 6 tabs first day, 5 tab on day 2, then 4 on day 3rd, 3 tabs on day 4th , 2 tab on day 5th, and 1 tab on 6th day., Print      CONTINUE these medications which have CHANGED   Details  azithromycin (ZITHROMAX) 250 MG tablet Take 1 tablet (250 mg total) by mouth daily. Take 2 tablets daily on day one, then 1 tablet daily until finished., Starting 05/23/2015, Until Discontinued, Print      CONTINUE these medications which have NOT CHANGED   Details  acetaminophen (TYLENOL) 500 MG tablet Take 500-1,000 mg by mouth every 6 (six) hours as needed for mild pain, fever or headache. , Until Discontinued, Historical Med    aspirin 81 MG tablet Take 81 mg by mouth daily., Until Discontinued, Historical Med    atorvastatin (LIPITOR) 40 MG tablet Take 40 mg by mouth at bedtime., Until Discontinued, Historical Med    cholecalciferol (VITAMIN D) 1000 UNITS tablet Take 1,000 Units by mouth daily., Until Discontinued, Historical Med    clopidogrel (PLAVIX) 75 MG tablet Take 1 tablet by mouth at bedtime. , Until Discontinued, Historical Med    docusate sodium (COLACE) 250 MG capsule Take 250 mg by mouth daily., Until Discontinued, Historical Med    ferrous sulfate 325 (65 FE) MG tablet take 1  tablet by mouth every evening, Normal    fluticasone (FLONASE) 50 MCG/ACT nasal spray Place 2 sprays into both nostrils daily as needed for rhinitis., Until Discontinued, Historical Med    furosemide (LASIX) 20 MG tablet Take 1 tablet (20 mg total) by mouth daily., Starting 10/16/2014, Until Discontinued, Normal    guaiFENesin (MUCINEX) 600 MG 12 hr tablet Take 1 tablet (600 mg total) by mouth 2 (two) times daily., Starting 11/17/2014, Until Discontinued, Print    ipratropium-albuterol (DUONEB) 0.5-2.5 (3) MG/3ML  SOLN Take 3 mLs by nebulization 4 (four) times daily as needed (for wheezing/shortness of breath). , Until Discontinued, Historical Med    levothyroxine (SYNTHROID, LEVOTHROID) 88 MCG tablet Take 88 mcg by mouth daily before breakfast., Until Discontinued, Historical Med    LORazepam (ATIVAN) 0.5 MG tablet Take 0.5 mg by mouth every 6 (six) hours as needed for anxiety., Until Discontinued, Historical Med    metoprolol succinate (TOPROL-XL) 25 MG 24 hr tablet Take 12.5 mg by mouth daily., Until Discontinued, Historical Med    montelukast (SINGULAIR) 10 MG tablet Take 10 mg by mouth at bedtime., Until Discontinued, Historical Med    nicotine polacrilex (RA NICOTINE POLACRILEX) 4 MG gum Take 1 Dose by mouth as needed., Starting 05/09/2015, Until Wed 08/07/15, Historical Med    pantoprazole (PROTONIX) 40 MG tablet Take 40 mg by mouth daily., Until Discontinued, Historical Med    polyethylene glycol (MIRALAX / GLYCOLAX) packet Take 17 g by mouth daily., Until Discontinued, Historical Med    Potassium 99 MG TABS Take 99 mg by mouth daily. , Until Discontinued, Historical Med    PROAIR HFA 108 (90 Base) MCG/ACT inhaler inhale 2 puffs by mouth every 4 hours if needed for shortness of breath or wheezing, Normal    SYMBICORT 80-4.5 MCG/ACT inhaler inhale 2 puffs by mouth twice a day, Normal    theophylline (THEO-24) 200 MG 24 hr capsule Take 200 mg by mouth daily., Until Discontinued, Historical Med         DISCHARGE INSTRUCTIONS:    Follow with PMD in 2 weeks.  If you experience worsening of your admission symptoms, develop shortness of breath, life threatening emergency, suicidal or homicidal thoughts you must seek medical attention immediately by calling 911 or calling your MD immediately  if symptoms less severe.  You Must read complete instructions/literature along with all the possible adverse reactions/side effects for all the Medicines you take and that have been prescribed to you.  Take any new Medicines after you have completely understood and accept all the possible adverse reactions/side effects.   Please note  You were cared for by a hospitalist during your hospital stay. If you have any questions about your discharge medications or the care you received while you were in the hospital after you are discharged, you can call the unit and asked to speak with the hospitalist on call if the hospitalist that took care of you is not available. Once you are discharged, your primary care physician will handle any further medical issues. Please note that NO REFILLS for any discharge medications will be authorized once you are discharged, as it is imperative that you return to your primary care physician (or establish a relationship with a primary care physician if you do not have one) for your aftercare needs so that they can reassess your need for medications and monitor your lab values.    Today   CHIEF COMPLAINT:   Chief Complaint  Patient presents with  . Shortness  of Breath  . Cough    HISTORY OF PRESENT ILLNESS:  Samantha Goodman  is a 80 y.o. female with a known history of COPD on 2 L home oxygen, CAD status post prior stents, hypertension, hypothyroidism, congestive heart failure with diastolic dysfunction presents to the hospital secondary to worsening shortness of breath for almost 3 days now. Patient was in the hospital last month for lower GI bleed, received transfusion at the time, aspirin and Plavix which were held at discharge were restarted recently by her cardiologist. Denies any more rectal bleeding. For the last 3 days her breathing has worsened. Cough has worsened but she is only feeling clear mucoid phlegm. Called her PCP and was started on oral prednisone and azithromycin 2 days ago. Symptoms haven't improved, couldn't sleep at all last night due to dyspnea and presented to the emergency room. Chest x-ray here showing bibasilar infiltrates. So she is being  admitted for COPD exacerbation with pneumonia.   VITAL SIGNS:  Blood pressure 125/48, pulse 79, temperature 97.7 F (36.5 C), temperature source Oral, resp. rate 20, height 5\' 5"  (1.651 m), weight 59.875 kg (132 lb), SpO2 91 %.  I/O:  No intake or output data in the 24 hours ending 05/25/15 1027  PHYSICAL EXAMINATION:   GENERAL: 80 y.o.-year-old patient lying in the bed with no acute distress.  EYES: Pupils equal, round, reactive to light and accommodation. No scleral icterus. Extraocular muscles intact.  HEENT: Head atraumatic, normocephalic. Oropharynx and nasopharynx clear.  NECK: Supple, no jugular venous distention. No thyroid enlargement, no tenderness.  LUNGS: Normal breath sounds bilaterally, mild expiratory wheezing and rhonchi. No use of accessory muscles of respiration.  CARDIOVASCULAR: S1, S2 normal. No murmurs, rubs, or gallops.  ABDOMEN: Soft, nontender, nondistended. Bowel sounds present. No organomegaly or mass.  EXTREMITIES: No pedal edema, cyanosis, or clubbing.  NEUROLOGIC: Cranial nerves II through XII are intact. Muscle strength 5/5 in all extremities. Sensation intact. Gait not checked.  PSYCHIATRIC: The patient is alert and oriented x 3.  SKIN: No obvious rash, lesion, or ulcer.   DATA REVIEW:   CBC  Recent Labs Lab 05/21/15 0358  WBC 8.3  HGB 9.5*  HCT 28.7*  PLT 352    Chemistries   Recent Labs Lab 05/21/15 0358  NA 144  K 3.7  CL 102  CO2 33*  GLUCOSE 127*  BUN 21*  CREATININE 0.74  CALCIUM 9.0    Cardiac Enzymes  Recent Labs Lab 05/20/15 0930  TROPONINI <0.03    Microbiology Results  Results for orders placed or performed during the hospital encounter of 03/04/15  Rapid Influenza A&B Antigens ([facility] only)     Status: None   Collection Time: 03/04/15  9:38 AM  Result Value Ref Range Status   Influenza A [facility]) NOT DETECTED  Final   Influenza B [facility]) NOT DETECTED  Final    RADIOLOGY:  No results found.  EKG:    Orders placed or performed during the hospital encounter of 05/20/15  . ED EKG  . ED EKG  . EKG 12-Lead  . EKG 12-Lead  . EKG      Management plans discussed with the patient, family and they are in agreement.  CODE STATUS:  Code Status History    Date Active Date Inactive Code Status Order ID Comments User Context   05/20/2015  1:15 PM 05/23/2015  3:18 PM Full Code [id number]  Enid Baas, MD Inpatient   04/25/2015  9:24 PM 04/27/2015  7:37 PM Full Code  [id number]  Houston Siren, MD Inpatient   03/04/2015  2:46 PM 03/06/2015  7:15 PM DNR [id number]  Adrian Saran, MD Inpatient   11/14/2014  8:02 PM 11/17/2014  4:44 PM Full Code [id number]  Auburn Bilberry, MD Inpatient      TOTAL TIME TAKING CARE OF THIS PATIENT: 35 minutes.    Altamese Dilling M.D on 05/25/2015 at 10:27 AM  Between 7am to 6pm - Pager - [phone]  After 6pm go to [email or web address] - password [email or web address]  [facility]  Office  [phone]  CC: Primary care physician; Lorie Phenix, MD   Note: This dictation was prepared with Dragon dictation along with smaller phrase technology. Any transcriptional errors that result from this process are unintentional.

## 2015-05-31 ENCOUNTER — Ambulatory Visit (INDEPENDENT_AMBULATORY_CARE_PROVIDER_SITE_OTHER): Payer: Medicare Other | Admitting: Family Medicine

## 2015-05-31 ENCOUNTER — Encounter: Payer: Self-pay | Admitting: Family Medicine

## 2015-05-31 ENCOUNTER — Other Ambulatory Visit: Payer: Self-pay | Admitting: Family Medicine

## 2015-05-31 VITALS — BP 110/50 | HR 93 | Temp 97.5°F | Resp 20 | Wt 141.0 lb

## 2015-05-31 DIAGNOSIS — J441 Chronic obstructive pulmonary disease with (acute) exacerbation: Secondary | ICD-10-CM

## 2015-05-31 DIAGNOSIS — J410 Simple chronic bronchitis: Secondary | ICD-10-CM

## 2015-05-31 NOTE — Progress Notes (Signed)
Patient ID: Samantha Goodman, female   DOB: 05-06-1935, 80 y.o.   MRN: [id number]       Patient: Samantha Goodman Female    DOB: 02/05/1935   80 y.o.   MRN: [id number] Visit Date: 05/31/2015  Today's Provider: Lorie Phenix, MD   Chief Complaint  Patient presents with  . Hospitalization Follow-up   Subjective:    HPI  Follow up Hospitalization  Patient was admitted to [facility] on 05/20/2015 and discharged on 05/29/2015. She was treated for COPD exacerbation . Treatment for this included starting prednisone and antibiotics. She reports excellent compliance with treatment. She reports this condition is Improved. Patient reports that she uses nebulizer treatment every 4 hours daily. ------------------------------------------------------------------------------------     Allergies  Allergen Reactions  . Codeine Nausea And Vomiting  . Fluticasone-Salmeterol Other (See Comments)    Reaction:  Unknown   . Levofloxacin Other (See Comments)    Reaction:  Confusion and hallucinations   . Tiotropium Bromide Monohydrate Rash   Previous Medications   ACETAMINOPHEN (TYLENOL) 500 MG TABLET    Take 500-1,000 mg by mouth every 6 (six) hours as needed for mild pain, fever or headache.    ASPIRIN 81 MG TABLET    Take 81 mg by mouth daily.   ATORVASTATIN (LIPITOR) 40 MG TABLET    Take 40 mg by mouth at bedtime.   AZITHROMYCIN (ZITHROMAX) 250 MG TABLET    Take 1 tablet (250 mg total) by mouth daily. Take 2 tablets daily on day one, then 1 tablet daily until finished.   CEFUROXIME (CEFTIN) 250 MG TABLET    Take 1 tablet (250 mg total) by mouth 2 (two) times daily with a meal.   CHOLECALCIFEROL (VITAMIN D) 1000 UNITS TABLET    Take 1,000 Units by mouth daily.   CLOPIDOGREL (PLAVIX) 75 MG TABLET    Take 1 tablet by mouth at bedtime.    DOCUSATE SODIUM (COLACE) 250 MG CAPSULE    Take 250 mg by mouth daily.   FERROUS SULFATE 325 (65 FE) MG TABLET    take 1 tablet by mouth every evening   FLUTICASONE (FLONASE)  50 MCG/ACT NASAL SPRAY    Place 2 sprays into both nostrils daily as needed for rhinitis.   FUROSEMIDE (LASIX) 20 MG TABLET    Take 1 tablet (20 mg total) by mouth daily.   GUAIFENESIN (MUCINEX) 600 MG 12 HR TABLET    Take 1 tablet (600 mg total) by mouth 2 (two) times daily.   IPRATROPIUM-ALBUTEROL (DUONEB) 0.5-2.5 (3) MG/3ML SOLN    Take 3 mLs by nebulization 4 (four) times daily as needed (for wheezing/shortness of breath).    LEVOTHYROXINE (SYNTHROID, LEVOTHROID) 88 MCG TABLET    Take 88 mcg by mouth daily before breakfast.   LORAZEPAM (ATIVAN) 0.5 MG TABLET    Take 0.5 mg by mouth every 6 (six) hours as needed for anxiety.   METOPROLOL SUCCINATE (TOPROL-XL) 25 MG 24 HR TABLET    Take 12.5 mg by mouth daily.   MONTELUKAST (SINGULAIR) 10 MG TABLET    Take 10 mg by mouth at bedtime.   NICOTINE POLACRILEX (RA NICOTINE POLACRILEX) 4 MG GUM    Take 1 Dose by mouth as needed.   PANTOPRAZOLE (PROTONIX) 40 MG TABLET    Take 40 mg by mouth daily.   POLYETHYLENE GLYCOL (MIRALAX / GLYCOLAX) PACKET    Take 17 g by mouth daily.   POTASSIUM 99 MG TABS    Take 99 mg by  mouth daily.    PREDNISONE (STERAPRED UNI-PAK 21 TAB) 10 MG (21) TBPK TABLET    Take 6 tabs first day, 5 tab on day 2, then 4 on day 3rd, 3 tabs on day 4th , 2 tab on day 5th, and 1 tab on 6th day.   PROAIR HFA 108 (90 BASE) MCG/ACT INHALER    inhale 2 puffs by mouth every 4 hours if needed for shortness of breath or wheezing   SYMBICORT 80-4.5 MCG/ACT INHALER    inhale 2 puffs by mouth twice a day   THEOPHYLLINE (THEO-24) 200 MG 24 HR CAPSULE    Take 200 mg by mouth daily.    Review of Systems  Constitutional: Negative.   Respiratory: Positive for cough.     Social History  Substance Use Topics  . Smoking status: Former Smoker -- 0.25 packs/day for 65 years    Quit date: 11/11/2013  . Smokeless tobacco: Never Used     Comment: Quit smoking recently  . Alcohol Use: No   Objective:   BP 110/50 mmHg  Pulse 93  Temp(Src) 97.5 F  (36.4 C) (Oral)  Resp 20  Wt 141 lb (63.957 kg)  SpO2 95%  Physical Exam  Constitutional: She is oriented to person, place, and time. She appears well-developed and well-nourished.  Cardiovascular: Normal rate and regular rhythm.   Pulmonary/Chest: Effort normal. No respiratory distress. She has wheezes.  Expiratory and inspiratory rhonchi.   Neurological: She is alert and oriented to person, place, and time.  Psychiatric: She has a normal mood and affect. Her behavior is normal.      Assessment & Plan:     1. COPD exacerbation (HCC) Improved. Continue current medication. Call if symptoms recur. Patient has tendency to get sick very quickly . Will follow up with Dr. Sherrie MustacheFisher in 3 to 4 months.  Patient instructed to call back if condition worsens or does not continue to  improve.        Patient seen and examined by Dr. Leo GrosserNancy J.. Shanaya Schneck, and note scribed by Liz BeachSulibeya S. Dimas, CMA. I have reviewed the document for accuracy and completeness and I agree with above. - Leo GrosserNancy J. Tarnesha Ulloa, MD   Lorie PhenixNancy Concepcion Gillott, MD  [facility]

## 2015-06-05 ENCOUNTER — Inpatient Hospital Stay
Admission: EM | Admit: 2015-06-05 | Discharge: 2015-06-13 | DRG: 377 | Disposition: A | Discharge status: Home / Self Care | Payer: Medicare Other | Attending: Internal Medicine | Admitting: Internal Medicine

## 2015-06-05 ENCOUNTER — Emergency Department: Payer: Medicare Other

## 2015-06-05 ENCOUNTER — Encounter: Payer: Self-pay | Admitting: Emergency Medicine

## 2015-06-05 DIAGNOSIS — Z823 Family history of stroke: Secondary | ICD-10-CM | POA: Diagnosis not present

## 2015-06-05 DIAGNOSIS — Z8042 Family history of malignant neoplasm of prostate: Secondary | ICD-10-CM

## 2015-06-05 DIAGNOSIS — Z9114 Patient's other noncompliance with medication regimen: Secondary | ICD-10-CM

## 2015-06-05 DIAGNOSIS — Z9981 Dependence on supplemental oxygen: Secondary | ICD-10-CM | POA: Diagnosis not present

## 2015-06-05 DIAGNOSIS — K922 Gastrointestinal hemorrhage, unspecified: Secondary | ICD-10-CM | POA: Diagnosis not present

## 2015-06-05 DIAGNOSIS — I959 Hypotension, unspecified: Secondary | ICD-10-CM | POA: Diagnosis present

## 2015-06-05 DIAGNOSIS — E039 Hypothyroidism, unspecified: Secondary | ICD-10-CM

## 2015-06-05 DIAGNOSIS — R0600 Dyspnea, unspecified: Secondary | ICD-10-CM

## 2015-06-05 DIAGNOSIS — I5033 Acute on chronic diastolic (congestive) heart failure: Secondary | ICD-10-CM

## 2015-06-05 DIAGNOSIS — Z955 Presence of coronary angioplasty implant and graft: Secondary | ICD-10-CM

## 2015-06-05 DIAGNOSIS — Z885 Allergy status to narcotic agent status: Secondary | ICD-10-CM

## 2015-06-05 DIAGNOSIS — J9620 Acute and chronic respiratory failure, unspecified whether with hypoxia or hypercapnia: Secondary | ICD-10-CM

## 2015-06-05 DIAGNOSIS — T39015A Adverse effect of aspirin, initial encounter: Secondary | ICD-10-CM | POA: Diagnosis not present

## 2015-06-05 DIAGNOSIS — F0390 Unspecified dementia without behavioral disturbance: Secondary | ICD-10-CM

## 2015-06-05 DIAGNOSIS — Z8249 Family history of ischemic heart disease and other diseases of the circulatory system: Secondary | ICD-10-CM | POA: Diagnosis not present

## 2015-06-05 DIAGNOSIS — Z888 Allergy status to other drugs, medicaments and biological substances status: Secondary | ICD-10-CM | POA: Diagnosis not present

## 2015-06-05 DIAGNOSIS — T45525A Adverse effect of antithrombotic drugs, initial encounter: Secondary | ICD-10-CM | POA: Diagnosis present

## 2015-06-05 DIAGNOSIS — I5030 Unspecified diastolic (congestive) heart failure: Secondary | ICD-10-CM | POA: Diagnosis not present

## 2015-06-05 DIAGNOSIS — J449 Chronic obstructive pulmonary disease, unspecified: Secondary | ICD-10-CM | POA: Diagnosis not present

## 2015-06-05 DIAGNOSIS — Z8041 Family history of malignant neoplasm of ovary: Secondary | ICD-10-CM

## 2015-06-05 DIAGNOSIS — Y92009 Unspecified place in unspecified non-institutional (private) residence as the place of occurrence of the external cause: Secondary | ICD-10-CM

## 2015-06-05 DIAGNOSIS — Z9111 Patient's noncompliance with dietary regimen: Secondary | ICD-10-CM

## 2015-06-05 DIAGNOSIS — Z7902 Long term (current) use of antithrombotics/antiplatelets: Secondary | ICD-10-CM | POA: Diagnosis not present

## 2015-06-05 DIAGNOSIS — Z801 Family history of malignant neoplasm of trachea, bronchus and lung: Secondary | ICD-10-CM | POA: Diagnosis not present

## 2015-06-05 DIAGNOSIS — D62 Acute posthemorrhagic anemia: Secondary | ICD-10-CM | POA: Diagnosis present

## 2015-06-05 DIAGNOSIS — J440 Chronic obstructive pulmonary disease with acute lower respiratory infection: Secondary | ICD-10-CM | POA: Diagnosis present

## 2015-06-05 DIAGNOSIS — I4510 Unspecified right bundle-branch block: Secondary | ICD-10-CM | POA: Diagnosis not present

## 2015-06-05 DIAGNOSIS — Y95 Nosocomial condition: Secondary | ICD-10-CM | POA: Diagnosis present

## 2015-06-05 DIAGNOSIS — M810 Age-related osteoporosis without current pathological fracture: Secondary | ICD-10-CM | POA: Diagnosis not present

## 2015-06-05 DIAGNOSIS — K921 Melena: Secondary | ICD-10-CM | POA: Diagnosis not present

## 2015-06-05 DIAGNOSIS — Z803 Family history of malignant neoplasm of breast: Secondary | ICD-10-CM | POA: Diagnosis not present

## 2015-06-05 DIAGNOSIS — R062 Wheezing: Secondary | ICD-10-CM | POA: Diagnosis not present

## 2015-06-05 DIAGNOSIS — R000 Tachycardia, unspecified: Secondary | ICD-10-CM | POA: Diagnosis not present

## 2015-06-05 DIAGNOSIS — J441 Chronic obstructive pulmonary disease with (acute) exacerbation: Secondary | ICD-10-CM | POA: Diagnosis present

## 2015-06-05 DIAGNOSIS — J209 Acute bronchitis, unspecified: Secondary | ICD-10-CM | POA: Diagnosis not present

## 2015-06-05 DIAGNOSIS — I771 Stricture of artery: Secondary | ICD-10-CM | POA: Diagnosis not present

## 2015-06-05 DIAGNOSIS — J9621 Acute and chronic respiratory failure with hypoxia: Secondary | ICD-10-CM | POA: Diagnosis not present

## 2015-06-05 DIAGNOSIS — Z7982 Long term (current) use of aspirin: Secondary | ICD-10-CM | POA: Diagnosis not present

## 2015-06-05 DIAGNOSIS — J189 Pneumonia, unspecified organism: Secondary | ICD-10-CM | POA: Diagnosis present

## 2015-06-05 DIAGNOSIS — I2510 Atherosclerotic heart disease of native coronary artery without angina pectoris: Secondary | ICD-10-CM | POA: Diagnosis present

## 2015-06-05 DIAGNOSIS — R0602 Shortness of breath: Secondary | ICD-10-CM | POA: Diagnosis not present

## 2015-06-05 DIAGNOSIS — E785 Hyperlipidemia, unspecified: Secondary | ICD-10-CM | POA: Diagnosis present

## 2015-06-05 DIAGNOSIS — R918 Other nonspecific abnormal finding of lung field: Secondary | ICD-10-CM | POA: Diagnosis not present

## 2015-06-05 DIAGNOSIS — F0391 Unspecified dementia with behavioral disturbance: Secondary | ICD-10-CM | POA: Diagnosis present

## 2015-06-05 DIAGNOSIS — Z79899 Other long term (current) drug therapy: Secondary | ICD-10-CM | POA: Diagnosis not present

## 2015-06-05 DIAGNOSIS — I471 Supraventricular tachycardia, unspecified: Secondary | ICD-10-CM

## 2015-06-05 DIAGNOSIS — Z7951 Long term (current) use of inhaled steroids: Secondary | ICD-10-CM | POA: Diagnosis not present

## 2015-06-05 DIAGNOSIS — J90 Pleural effusion, not elsewhere classified: Secondary | ICD-10-CM | POA: Diagnosis not present

## 2015-06-05 DIAGNOSIS — J45909 Unspecified asthma, uncomplicated: Secondary | ICD-10-CM | POA: Diagnosis not present

## 2015-06-05 DIAGNOSIS — D500 Iron deficiency anemia secondary to blood loss (chronic): Secondary | ICD-10-CM | POA: Diagnosis not present

## 2015-06-05 DIAGNOSIS — I25719 Atherosclerosis of autologous vein coronary artery bypass graft(s) with unspecified angina pectoris: Secondary | ICD-10-CM | POA: Diagnosis not present

## 2015-06-05 DIAGNOSIS — K625 Hemorrhage of anus and rectum: Secondary | ICD-10-CM | POA: Diagnosis not present

## 2015-06-05 DIAGNOSIS — J9611 Chronic respiratory failure with hypoxia: Secondary | ICD-10-CM | POA: Diagnosis not present

## 2015-06-05 LAB — HEMOGLOBIN AND HEMATOCRIT, BLOOD
HCT: 24.1 % — ABNORMAL LOW (ref 35.0–47.0)
HEMATOCRIT: 23.2 % — AB (ref 35.0–47.0)
HEMATOCRIT: 21.4 % — AB (ref 35.0–47.0)
HEMOGLOBIN: 7.6 g/dL — AB (ref 12.0–16.0)
HEMOGLOBIN: 8 g/dL — AB (ref 12.0–16.0)
Hemoglobin: 7 g/dL — ABNORMAL LOW (ref 12.0–16.0)

## 2015-06-05 LAB — COMPREHENSIVE METABOLIC PANEL
ALK PHOS: 57 U/L (ref 38–126)
ALT: 13 U/L — AB (ref 14–54)
AST: 26 U/L (ref 15–41)
Albumin: 3.6 g/dL (ref 3.5–5.0)
Anion gap: 8 (ref 5–15)
BUN: 22 mg/dL — AB (ref 6–20)
CALCIUM: 8.6 mg/dL — AB (ref 8.9–10.3)
CO2: 31 mmol/L (ref 22–32)
CREATININE: 0.94 mg/dL (ref 0.44–1.00)
Chloride: 101 mmol/L (ref 101–111)
GFR calc non Af Amer: 57 mL/min — ABNORMAL LOW (ref >60)
Glucose, Bld: 120 mg/dL — ABNORMAL HIGH (ref 65–99)
Potassium: 3.7 mmol/L (ref 3.5–5.1)
SODIUM: 140 mmol/L (ref 135–145)
Total Bilirubin: 0.4 mg/dL (ref 0.3–1.2)
Total Protein: 6.4 g/dL — ABNORMAL LOW (ref 6.5–8.1)

## 2015-06-05 LAB — MRSA PCR SCREENING: MRSA by PCR: NEGATIVE

## 2015-06-05 LAB — CBC WITH DIFFERENTIAL/PLATELET
BASOS ABS: 0.1 10*3/uL (ref 0–0.1)
Basophils Relative: 0 %
EOS ABS: 0 10*3/uL (ref 0–0.7)
Eosinophils Relative: 0 %
HCT: 25.8 % — ABNORMAL LOW (ref 35.0–47.0)
HEMOGLOBIN: 8.2 g/dL — AB (ref 12.0–16.0)
LYMPHS ABS: 1.7 10*3/uL (ref 1.0–3.6)
MCH: 25.9 pg — AB (ref 26.0–34.0)
MCHC: 32 g/dL (ref 32.0–36.0)
MCV: 81.1 fL (ref 80.0–100.0)
Monocytes Absolute: 0.9 10*3/uL (ref 0.2–0.9)
Monocytes Relative: 6 %
Neutro Abs: 11 10*3/uL — ABNORMAL HIGH (ref 1.4–6.5)
Platelets: 401 10*3/uL (ref 150–440)
RBC: 3.18 MIL/uL — AB (ref 3.80–5.20)
RDW: 14.8 % — ABNORMAL HIGH (ref 11.5–14.5)
WBC: 13.7 10*3/uL — AB (ref 3.6–11.0)

## 2015-06-05 LAB — PROTIME-INR
INR: 0.96
Prothrombin Time: 13 seconds (ref 11.4–15.0)

## 2015-06-05 LAB — TROPONIN I: Troponin I: <0.03 ng/mL (ref <0.031)

## 2015-06-05 LAB — EXPECTORATED SPUTUM ASSESSMENT W REFEX TO RESP CULTURE

## 2015-06-05 LAB — BRAIN NATRIURETIC PEPTIDE: B Natriuretic Peptide: 36 pg/mL (ref 0.0–100.0)

## 2015-06-05 LAB — PREPARE RBC (CROSSMATCH)

## 2015-06-05 LAB — EXPECTORATED SPUTUM ASSESSMENT W GRAM STAIN, RFLX TO RESP C

## 2015-06-05 MED ORDER — DOCUSATE SODIUM 100 MG PO CAPS
250.0000 mg | ORAL_CAPSULE | Freq: Every day | ORAL | Status: DC
  Filled 2015-06-05: qty 1

## 2015-06-05 MED ORDER — FLUTICASONE PROPIONATE 50 MCG/ACT NA SUSP: 2.0000 | Freq: Every day | NASAL | Status: DC | PRN

## 2015-06-05 MED ORDER — POTASSIUM CHLORIDE IN NACL 20-0.9 MEQ/L-% IV SOLN
INTRAVENOUS | Status: AC
  Administered 2015-06-05: 12:00:00 via INTRAVENOUS
  Filled 2015-06-05: qty 1000

## 2015-06-05 MED ORDER — ATORVASTATIN CALCIUM 20 MG PO TABS
40.0000 mg | ORAL_TABLET | Freq: Every day | ORAL | Status: DC
  Administered 2015-06-05 – 2015-06-12 (×8): 40 mg via ORAL
  Filled 2015-06-05 (×8): qty 2

## 2015-06-05 MED ORDER — DOCUSATE SODIUM 100 MG PO CAPS
200.0000 mg | ORAL_CAPSULE | Freq: Every day | ORAL | Status: DC
  Administered 2015-06-06 – 2015-06-13 (×8): 200 mg via ORAL
  Filled 2015-06-05 (×5): qty 2
  Filled 2015-06-05: qty 1
  Filled 2015-06-05 (×4): qty 2

## 2015-06-05 MED ORDER — PIPERACILLIN-TAZOBACTAM 3.375 G IVPB 30 MIN
3.3750 g | Freq: Three times a day (TID) | INTRAVENOUS | Status: DC
  Administered 2015-06-05 – 2015-06-08 (×8): 3.375 g via INTRAVENOUS
  Filled 2015-06-05 (×11): qty 50

## 2015-06-05 MED ORDER — THEOPHYLLINE ER 200 MG PO CP24
200.0000 mg | ORAL_CAPSULE | Freq: Every day | ORAL | Status: DC
  Administered 2015-06-06 – 2015-06-13 (×8): 200 mg via ORAL
  Filled 2015-06-05 (×9): qty 1

## 2015-06-05 MED ORDER — ONDANSETRON HCL 4 MG/2ML IJ SOLN
4.0000 mg | Freq: Four times a day (QID) | INTRAMUSCULAR | Status: DC | PRN
  Administered 2015-06-06: 4 mg via INTRAVENOUS
  Filled 2015-06-05 (×2): qty 2

## 2015-06-05 MED ORDER — VANCOMYCIN HCL IN DEXTROSE 1-5 GM/200ML-% IV SOLN
1000.0000 mg | INTRAVENOUS | Status: DC
  Administered 2015-06-05: 1000 mg via INTRAVENOUS
  Filled 2015-06-05 (×2): qty 200

## 2015-06-05 MED ORDER — FUROSEMIDE 10 MG/ML IJ SOLN
20.0000 mg | Freq: Once | INTRAMUSCULAR | Status: AC
  Administered 2015-06-06: 20 mg via INTRAVENOUS
  Filled 2015-06-05: qty 2

## 2015-06-05 MED ORDER — SODIUM CHLORIDE 0.9% FLUSH
3.0000 mL | Freq: Two times a day (BID) | INTRAVENOUS | Status: DC
  Administered 2015-06-05 – 2015-06-13 (×16): 3 mL via INTRAVENOUS

## 2015-06-05 MED ORDER — METOPROLOL SUCCINATE ER 25 MG PO TB24
12.5000 mg | ORAL_TABLET | Freq: Every day | ORAL | Status: DC
  Administered 2015-06-06 – 2015-06-07 (×2): 12.5 mg via ORAL
  Filled 2015-06-05: qty 2
  Filled 2015-06-05 (×2): qty 1

## 2015-06-05 MED ORDER — ACETAMINOPHEN 650 MG RE SUPP: 650.0000 mg | Freq: Four times a day (QID) | RECTAL | Status: DC | PRN

## 2015-06-05 MED ORDER — IPRATROPIUM-ALBUTEROL 0.5-2.5 (3) MG/3ML IN SOLN
3.0000 mL | Freq: Four times a day (QID) | RESPIRATORY_TRACT | Status: DC
  Administered 2015-06-05 – 2015-06-11 (×23): 3 mL via RESPIRATORY_TRACT
  Filled 2015-06-05 (×26): qty 3

## 2015-06-05 MED ORDER — PANTOPRAZOLE SODIUM 40 MG IV SOLR
40.0000 mg | Freq: Two times a day (BID) | INTRAVENOUS | Status: DC
  Administered 2015-06-05 – 2015-06-12 (×16): 40 mg via INTRAVENOUS
  Filled 2015-06-05 (×17): qty 40

## 2015-06-05 MED ORDER — GUAIFENESIN ER 600 MG PO TB12
600.0000 mg | ORAL_TABLET | Freq: Two times a day (BID) | ORAL | Status: DC
  Administered 2015-06-05 – 2015-06-13 (×16): 600 mg via ORAL
  Filled 2015-06-05 (×17): qty 1

## 2015-06-05 MED ORDER — ONDANSETRON HCL 4 MG PO TABS: 4.0000 mg | ORAL_TABLET | Freq: Four times a day (QID) | ORAL | Status: DC | PRN

## 2015-06-05 MED ORDER — ACETAMINOPHEN 325 MG PO TABS
650.0000 mg | ORAL_TABLET | Freq: Four times a day (QID) | ORAL | Status: DC | PRN
  Administered 2015-06-07: 650 mg via ORAL
  Filled 2015-06-05: qty 2

## 2015-06-05 MED ORDER — VANCOMYCIN HCL IN DEXTROSE 1-5 GM/200ML-% IV SOLN
1000.0000 mg | Freq: Once | INTRAVENOUS | Status: AC
  Administered 2015-06-05: 1000 mg via INTRAVENOUS
  Filled 2015-06-05: qty 200

## 2015-06-05 MED ORDER — SODIUM CHLORIDE 0.9 % IV SOLN
1000.0000 mL | Freq: Once | INTRAVENOUS | Status: AC
  Administered 2015-06-05: 250 mL via INTRAVENOUS

## 2015-06-05 MED ORDER — POTASSIUM 99 MG PO TABS: 99.0000 mg | ORAL_TABLET | Freq: Every day | ORAL | Status: DC

## 2015-06-05 MED ORDER — MONTELUKAST SODIUM 10 MG PO TABS
10.0000 mg | ORAL_TABLET | Freq: Every day | ORAL | Status: DC
  Administered 2015-06-05 – 2015-06-12 (×8): 10 mg via ORAL
  Filled 2015-06-05 (×8): qty 1

## 2015-06-05 MED ORDER — ZOLPIDEM TARTRATE 5 MG PO TABS
5.0000 mg | ORAL_TABLET | Freq: Every evening | ORAL | Status: DC | PRN
  Administered 2015-06-06 – 2015-06-12 (×6): 5 mg via ORAL
  Filled 2015-06-05 (×6): qty 1

## 2015-06-05 MED ORDER — ALBUTEROL SULFATE (2.5 MG/3ML) 0.083% IN NEBU
2.5000 mg | INHALATION_SOLUTION | RESPIRATORY_TRACT | Status: DC | PRN
  Administered 2015-06-05 – 2015-06-11 (×11): 2.5 mg via RESPIRATORY_TRACT
  Filled 2015-06-05 (×10): qty 3

## 2015-06-05 MED ORDER — LEVOTHYROXINE SODIUM 88 MCG PO TABS
88.0000 ug | ORAL_TABLET | Freq: Every day | ORAL | Status: DC
  Administered 2015-06-06 – 2015-06-13 (×7): 88 ug via ORAL
  Filled 2015-06-05 (×7): qty 1

## 2015-06-05 MED ORDER — PIPERACILLIN-TAZOBACTAM 3.375 G IVPB 30 MIN
3.3750 g | Freq: Once | INTRAVENOUS | Status: AC
  Administered 2015-06-05: 3.375 g via INTRAVENOUS
  Filled 2015-06-05: qty 50

## 2015-06-05 MED ORDER — SODIUM CHLORIDE 0.9 % IV SOLN: Freq: Once | INTRAVENOUS | Status: DC

## 2015-06-05 NOTE — ED Notes (Signed)
Pt arrived via EMS from home for reports of rectal bleed this morning when she had a bowel movement. Pt also reports shortness of breath. EMS reports 98% 2L, 104/60, HR 90, NSR. EMS administered duoneb x1.

## 2015-06-05 NOTE — H&P (Signed)
[facility] [place] at [facility]   PATIENT NAME: Samantha KhatMary Greulich    MR#:  [id number]  DATE OF BIRTH:  07/17/1935  DATE OF ADMISSION:  06/05/2015  PRIMARY CARE PHYSICIAN: Lorie PhenixNancy Maloney, MD   REQUESTING/REFERRING PHYSICIAN: Darnelle CatalanMalinda  CHIEF COMPLAINT:  Shortness of breath and rectal bleed  HISTORY OF PRESENT ILLNESS:  Samantha Goodman  is a 80 y.o. female with a known history of COPD, hyperlipidemia, coronary artery disease was CHF and hypothyroidism and multiple other medical problems is presenting to the ED with a chief complaint of shortness of breath or chest pain started since morning and has been progressively getting worse. Also patient has noticed bowel movement with large amount of maroon-colored blood. Patient was feeling dizzy and came into the ED. He is stool for Hemoccult was positive. Chest x-ray for possible infiltrate. Patient was recently admitted to the hospital on May 8 for COPD exacerbation and was discharged with prednisone and antibiotics. Patient was also recently admitted for GI bleed and at that time her aspirin and Plavix were discontinued. After GI bleed was resolved patient self resumed aspirin and Plavix was restarted by cardiology Dr. Lady Garyfath as patient was not actively bleeding and GI bleed was resolved. During my examination patient denies any abdominal pain or nausea or vomiting.  PAST MEDICAL HISTORY:   Past Medical History  Diagnosis Date  . COPD (chronic obstructive pulmonary disease) (HCC)   . Asthma   . CAD (coronary artery disease)   . Unstable angina pectoris (HCC)   . Hyperlipemia   . Agitation   . Fall   . Hypothyroid   . CHF (congestive heart failure) (HCC)     Diastolic CHF, EF 40%55%    PAST SURGICAL HISTOIRY:   Past Surgical History  Procedure Laterality Date  . Eye surgery    . Abdominal hysterectomy    . Appendectomy    . Ankle surgery Right     SOCIAL HISTORY:   Social History  Substance Use Topics  . Smoking  status: Former Smoker -- 0.25 packs/day for 65 years    Quit date: 11/11/2013  . Smokeless tobacco: Never Used     Comment: Quit smoking recently  . Alcohol Use: No    FAMILY HISTORY:   Family History  Problem Relation Age of Onset  . Stroke Mother   . Prostate cancer Father   . CAD Brother   . Ovarian cancer Sister   . Breast cancer Sister   . Lung cancer Sister     DRUG ALLERGIES:   Allergies  Allergen Reactions  . Codeine Nausea And Vomiting  . Fluticasone-Salmeterol Other (See Comments)    Reaction:  Unknown   . Levofloxacin Other (See Comments)    Reaction:  Confusion and hallucinations   . Tiotropium Bromide Monohydrate Rash    REVIEW OF SYSTEMS:  CONSTITUTIONAL: No fever, fatigue or weakness.  EYES: No blurred or double vision.  EARS, NOSE, AND THROAT: No tinnitus or ear pain.  RESPIRATORY: Reporting cough, shortness of breath, denies wheezing or hemoptysis.  CARDIOVASCULAR: No chest pain, orthopnea, edema.  GASTROINTESTINAL: No nausea, vomiting, diarrhea or abdominal pain. Reporting maroon-colored stool GENITOURINARY: No dysuria, hematuria.  ENDOCRINE: No polyuria, nocturia,  HEMATOLOGY: No anemia, easy bruising or bleeding SKIN: No rash or lesion. MUSCULOSKELETAL: No joint pain or arthritis.   NEUROLOGIC: No tingling, numbness, weakness.  PSYCHIATRY: No anxiety or depression.   MEDICATIONS AT HOME:   Prior to Admission medications   Medication Sig Start  Date End Date Taking? Authorizing Provider  acetaminophen (TYLENOL) 500 MG tablet Take 500-1,000 mg by mouth every 6 (six) hours as needed for mild pain, fever or headache.    Yes Historical Provider, MD  albuterol (PROVENTIL HFA;VENTOLIN HFA) 108 (90 Base) MCG/ACT inhaler Inhale 2 puffs into the lungs every 4 (four) hours as needed for wheezing or shortness of breath.   Yes Historical Provider, MD  aspirin 81 MG tablet Take 81 mg by mouth daily.   Yes Historical Provider, MD  atorvastatin (LIPITOR) 40 MG  tablet Take 40 mg by mouth at bedtime.   Yes Historical Provider, MD  budesonide-formoterol (SYMBICORT) 80-4.5 MCG/ACT inhaler Inhale 2 puffs into the lungs 2 (two) times daily.   Yes Historical Provider, MD  cholecalciferol (VITAMIN D) 1000 UNITS tablet Take 1,000 Units by mouth daily.   Yes Historical Provider, MD  clopidogrel (PLAVIX) 75 MG tablet Take 1 tablet by mouth at bedtime.    Yes Historical Provider, MD  docusate sodium (COLACE) 250 MG capsule Take 250 mg by mouth daily.   Yes Historical Provider, MD  ferrous sulfate 325 (65 FE) MG tablet take 1 tablet by mouth every evening 03/27/15  Yes Lorie Phenix, MD  fluticasone [facility]) 50 MCG/ACT nasal spray Place 2 sprays into both nostrils daily as needed for rhinitis.   Yes Historical Provider, MD  furosemide (LASIX) 20 MG tablet Take 1 tablet (20 mg total) by mouth daily. 10/16/14  Yes Lorie Phenix, MD  guaiFENesin (MUCINEX) 600 MG 12 hr tablet Take 1 tablet (600 mg total) by mouth 2 (two) times daily. 11/17/14  Yes Gale Journey, MD  ipratropium-albuterol (DUONEB) 0.5-2.5 (3) MG/3ML SOLN Take 3 mLs by nebulization 4 (four) times daily as needed (for wheezing/shortness of breath).    Yes Historical Provider, MD  levothyroxine (SYNTHROID, LEVOTHROID) 88 MCG tablet Take 88 mcg by mouth daily before breakfast.   Yes Historical Provider, MD  metoprolol succinate (TOPROL-XL) 25 MG 24 hr tablet Take 12.5 mg by mouth daily.   Yes Historical Provider, MD  montelukast (SINGULAIR) 10 MG tablet Take 10 mg by mouth at bedtime.   Yes Historical Provider, MD  nicotine polacrilex (RA NICOTINE POLACRILEX) 4 MG gum Take 1 Dose by mouth as needed. 05/09/15 08/07/15 Yes Historical Provider, MD  pantoprazole (PROTONIX) 40 MG tablet Take 40 mg by mouth daily.   Yes Historical Provider, MD  polyethylene glycol (MIRALAX / GLYCOLAX) packet Take 17 g by mouth daily.   Yes Historical Provider, MD  Potassium 99 MG TABS Take 99 mg by mouth daily.    Yes Historical  Provider, MD  theophylline (THEO-24) 200 MG 24 hr capsule Take 200 mg by mouth daily.   Yes Historical Provider, MD      VITAL SIGNS:  Blood pressure 113/50, pulse 84, temperature 98.2 F (36.8 C), temperature source Oral, resp. rate 22, height 5' 5.5" (1.664 m), weight 68.04 kg (150 lb), SpO2 100 %.  PHYSICAL EXAMINATION:  GENERAL:  80 y.o.-year-old patient lying in the bed with no acute distress.  EYES: Pupils equal, round, reactive to light and accommodation. No scleral icterus. Extraocular muscles intact.  HEENT: Head atraumatic, normocephalic. Oropharynx and nasopharynx clear.  NECK:  Supple, no jugular venous distention. No thyroid enlargement, no tenderness.  LUNGS: Moderate breath sounds bilaterally, diminished at the bases, positive crepitations and crackles, no wheezing, rales,rhonchi . No use of accessory muscles of respiration.  CARDIOVASCULAR: S1, S2 normal. No murmurs, rubs, or gallops.  ABDOMEN: Soft, nontender, nondistended.  Bowel sounds present. No organomegaly or mass.  EXTREMITIES: No pedal edema, cyanosis, or clubbing.  NEUROLOGIC: Cranial nerves II through XII are intact. Muscle strength 5/5 in all extremities. Sensation intact. Gait not checked.  PSYCHIATRIC: The patient is alert and oriented x 3.  SKIN: No obvious rash, lesion, or ulcer.   LABORATORY PANEL:   CBC  Recent Labs Lab 06/05/15 0813 06/05/15 1238  WBC 13.7*  --   HGB 8.2* 7.6*  HCT 25.8* 23.2*  PLT 401  --    ------------------------------------------------------------------------------------------------------------------  Chemistries   Recent Labs Lab 06/05/15 0813  NA 140  K 3.7  CL 101  CO2 31  GLUCOSE 120*  BUN 22*  CREATININE 0.94  CALCIUM 8.6*  AST 26  ALT 13*  ALKPHOS 57  BILITOT 0.4   ------------------------------------------------------------------------------------------------------------------  Cardiac Enzymes  Recent Labs Lab 06/05/15 0813  TROPONINI <0.03    ------------------------------------------------------------------------------------------------------------------  RADIOLOGY:  Dg Chest Portable 1 View  06/05/2015  CLINICAL DATA:  Worsening shortness of Breath, difficulty breathing EXAM: PORTABLE CHEST 1 VIEW COMPARISON:  5/8/7 FINDINGS: Hyperinflation again noted. Small left pleural effusion with left basilar atelectasis or infiltrate. Trace right pleural effusion with right basilar atelectasis. No pulmonary edema. Again noted old fracture of the left eighth rib. IMPRESSION: Small left pleural effusion with left basilar atelectasis or infiltrate. Trace right pleural effusion with right basilar atelectasis. No pulmonary edema. Electronically Signed   By: Natasha Mead M.D.   On: 06/05/2015 08:44    EKG:   Orders placed or performed during the hospital encounter of 06/05/15  . EKG 12-Lead  . EKG 12-Lead  . ED EKG  . ED EKG  . ED EKG  . ED EKG    IMPRESSION AND PLAN:   Samantha Goodman  is a 80 y.o. female with a known history of COPD, hyperlipidemia, coronary artery disease was CHF and hypothyroidism and multiple other medical problems is presenting to the ED with a chief complaint of shortness of breath or chest pain started since morning and has been progressively getting worse. Also patient has noticed bowel movement with large amount of maroon-colored blood. Patient was feeling dizzy and came into the ED. He is stool for Hemoccult was positive. Chest x-ray for possible infiltrate. Patient was recently admitted to the hospital on May 8 for COPD exacerbation and was discharged with prednisone and antibiotics. Patient was also recently admitted for GI bleed and at that time her aspirin and Plavix were discontinued.   # Shortness of breath secondary to healthcare associated pneumonia Admit patient to MedSurg unit Sputum culture and sensitivity is ordered Patient is started on Zosyn and vancomycin after blood cultures were obtained in the ED,  will continue the same Provide breathing treatments as needed basis  #Hematochezia probably from aspirin and Plavix Discontinue aspirin and Plavix at this time GI consult is placed Nothing by mouth except sips and meds and the PPI is started Patient had recent history of GI bleed and her Plavix and aspirin were discontinued and patient  self resumed her aspirin and Plavix was resumed by cardiology as GI bleed was completely resolved Monitor hemoglobin and crit closely and transfuse as needed  #History of coronary artery disease status post stents in 2015 Hold aspirin and Plavix in view of current GI bleed Cardiology consult is placed to Dr.fath  #COPD currently no exacerbation. Continue breathing treatments as needed basis.  #History of congestive heart failure not currently fluid overloaded Monitor patient's intake and output and for symptoms  and signs of fluid overload Provided gentle hydration of IV fluids 500 mL only Hold Lasix in view of borderline blood pressure   #Hypothyroidism continue Synthroid   Provide GI and DVT prophylaxis with SCDs          All the records are reviewed and case discussed with ED provider. Management plans discussed with the patient, family and they are in agreement.  CODE STATUS: dnr/ daughter Steward Drone   TOTAL TIME TAKING CARE OF THIS PATIENT: 50  minutes.    Ramonita Lab M.D on 06/05/2015 at 3:05 PM  Between 7am to 6pm - Pager - [phone]  After 6pm go to [email or web address] - password [email or web address]  [place] [facility]  Office  [phone]  CC: Primary care physician; Lorie Phenix, MD

## 2015-06-05 NOTE — Consult Note (Signed)
GI Inpatient Consult Note  Reason for Consult: Lower GI Bleed   Attending Requesting Consult: Dr. Amado Coe  History of Present Illness: Samantha Goodman is a 80 y.o. female with a history of COPD, CAD and history of GIB in April 2017.  She was seen in consultation by Dr. Bluford Kaufmann during her April's admission. She had a negative bleeding scan. Given her extensive CAD history, she was too high a risk to undergo a colonoscopy.   She resumed ASA herself without any signs of bleeding. Then resumed Plavix on 05/09/15. She did well until she had a large bloody bowel movement this morning with subsequent dizziness and weakness. She presented to the ER this morning for evaluation. Her hemoglobin was noted to be 8.2 gm/dl which was down from 9.5 gm/dl. She reports having another bloody stool after admission. A follow up hemoglobin resulted at 7.6 gm/dl. Plavix and ASA has been discontinued. Protonix IV started. She denies nausea, vomiting or abdominal pain.   Past Medical History:  Past Medical History  Diagnosis Date  . COPD (chronic obstructive pulmonary disease) (HCC)   . Asthma   . CAD (coronary artery disease)   . Unstable angina pectoris (HCC)   . Hyperlipemia   . Agitation   . Fall   . Hypothyroid   . CHF (congestive heart failure) (HCC)     Diastolic CHF, EF 16%    Problem List: Patient Active Problem List   Diagnosis Date Noted  . LGI bleed 06/05/2015  . COPD exacerbation (HCC) 05/20/2015  . CAD in native artery 05/09/2015  . GI bleed 04/25/2015  . COPD (chronic obstructive pulmonary disease) (HCC) 11/21/2014  . Senile purpura (HCC) 11/21/2014  . Oxygen dependent 07/13/2014  . Acid reflux 07/09/2014  . Allergic rhinitis 07/06/2014  . Anxiety 07/06/2014  . Body mass index (BMI) of 23.0-23.9 in adult 07/06/2014  . CCF (congestive cardiac failure) (HCC) 07/06/2014  . Chronic constipation 07/06/2014  . Dizziness 07/06/2014  . Edema extremities 07/06/2014  . Bleeding gastrointestinal  07/06/2014  . Hypercholesteremia 07/06/2014  . Decreased potassium in the blood 07/06/2014  . Adult hypothyroidism 07/06/2014  . OP (osteoporosis) 07/06/2014  . Procidentia of rectum 07/06/2014  . Tobacco abuse, in remission 07/06/2014  . Anemia 07/04/2014  . Arteriosclerosis of coronary artery 03/09/2013    Past Surgical History: Past Surgical History  Procedure Laterality Date  . Eye surgery    . Abdominal hysterectomy    . Appendectomy    . Ankle surgery Right     Allergies: Allergies  Allergen Reactions  . Codeine Nausea And Vomiting  . Fluticasone-Salmeterol Other (See Comments)    Reaction:  Unknown   . Levofloxacin Other (See Comments)    Reaction:  Confusion and hallucinations   . Tiotropium Bromide Monohydrate Rash    Home Medications: Prescriptions prior to admission  Medication Sig Dispense Refill Last Dose  . acetaminophen (TYLENOL) 500 MG tablet Take 500-1,000 mg by mouth every 6 (six) hours as needed for mild pain, fever or headache.    prn  . albuterol (PROVENTIL HFA;VENTOLIN HFA) 108 (90 Base) MCG/ACT inhaler Inhale 2 puffs into the lungs every 4 (four) hours as needed for wheezing or shortness of breath.   prn  . aspirin 81 MG tablet Take 81 mg by mouth daily.   06/05/2015 at 0600  . atorvastatin (LIPITOR) 40 MG tablet Take 40 mg by mouth at bedtime.   06/04/2015 at Unknown time  . budesonide-formoterol (SYMBICORT) 80-4.5 MCG/ACT inhaler Inhale 2 puffs  into the lungs 2 (two) times daily.   06/05/2015 at Unknown time  . cholecalciferol (VITAMIN D) 1000 UNITS tablet Take 1,000 Units by mouth daily.   06/05/2015 at Unknown time  . clopidogrel (PLAVIX) 75 MG tablet Take 1 tablet by mouth at bedtime.    06/04/2015 at 1700  . docusate sodium (COLACE) 250 MG capsule Take 250 mg by mouth daily.   06/05/2015 at Unknown time  . ferrous sulfate 325 (65 FE) MG tablet take 1 tablet by mouth every evening 90 tablet 1 06/04/2015 at Unknown time  . fluticasone (FLONASE) 50 MCG/ACT  nasal spray Place 2 sprays into both nostrils daily as needed for rhinitis.   prn  . furosemide (LASIX) 20 MG tablet Take 1 tablet (20 mg total) by mouth daily. 90 tablet 3 06/05/2015 at Unknown time  . guaiFENesin (MUCINEX) 600 MG 12 hr tablet Take 1 tablet (600 mg total) by mouth 2 (two) times daily. 20 tablet 0 06/05/2015 at Unknown time  . ipratropium-albuterol (DUONEB) 0.5-2.5 (3) MG/3ML SOLN Take 3 mLs by nebulization 4 (four) times daily as needed (for wheezing/shortness of breath).    prn  . levothyroxine (SYNTHROID, LEVOTHROID) 88 MCG tablet Take 88 mcg by mouth daily before breakfast.   06/05/2015 at Unknown time  . metoprolol succinate (TOPROL-XL) 25 MG 24 hr tablet Take 12.5 mg by mouth daily.   06/05/2015 at 0600  . montelukast (SINGULAIR) 10 MG tablet Take 10 mg by mouth at bedtime.   06/04/2015 at Unknown time  . nicotine polacrilex (RA NICOTINE POLACRILEX) 4 MG gum Take 1 Dose by mouth as needed.   prn  . pantoprazole (PROTONIX) 40 MG tablet Take 40 mg by mouth daily.   06/05/2015 at Unknown time  . polyethylene glycol (MIRALAX / GLYCOLAX) packet Take 17 g by mouth daily.   06/05/2015 at Unknown time  . Potassium 99 MG TABS Take 99 mg by mouth daily.    06/05/2015 at Unknown time  . theophylline (THEO-24) 200 MG 24 hr capsule Take 200 mg by mouth daily.   06/05/2015 at Unknown time   Home medication reconciliation was completed with the patient.   Scheduled Inpatient Medications:   . sodium chloride   Intravenous Once  . atorvastatin  40 mg Oral QHS  . docusate sodium  200 mg Oral Daily  . furosemide  20 mg Intravenous Once  . guaiFENesin  600 mg Oral BID  . ipratropium-albuterol  3 mL Nebulization Q6H  . [START ON 06/06/2015] levothyroxine  88 mcg Oral QAC breakfast  . metoprolol succinate  12.5 mg Oral Daily  . montelukast  10 mg Oral QHS  . pantoprazole (PROTONIX) IV  40 mg Intravenous Q12H  . piperacillin-tazobactam  3.375 g Intravenous Q8H  . sodium chloride flush  3 mL  Intravenous Q12H  . theophylline  200 mg Oral Daily  . vancomycin  1,000 mg Intravenous Q24H    Continuous Inpatient Infusions:   . 0.9 % NaCl with KCl 20 mEq / L 50 mL/hr at 06/05/15 1214    PRN Inpatient Medications:  acetaminophen **OR** acetaminophen, albuterol, fluticasone, ondansetron **OR** ondansetron (ZOFRAN) IV, zolpidem  Family History: family history includes Breast cancer in her sister; CAD in her brother; Lung cancer in her sister; Ovarian cancer in her sister; Prostate cancer in her father; Stroke in her mother.  The patient's family history is negative for inflammatory bowel disorders, GI malignancy, or solid organ transplantation.  Social History:   reports that she quit smoking  about 18 months ago. She has never used smokeless tobacco. She reports that she does not drink alcohol or use illicit drugs.   Review of Systems: Constitutional: Weight is stable.  Eyes: No changes in vision. ENT: No oral lesions, sore throat.  GI: see HPI.  Heme/Lymph: No easy bruising.  CV: No chest pain.  GU: No hematuria.  Integumentary: No rashes.  Neuro: No headaches.  Psych: No depression/anxiety.  Endocrine: No heat/cold intolerance.  Allergic/Immunologic: No urticaria.  Resp: No cough, SOB.  Musculoskeletal: No joint swelling.   Physical Examination: BP 113/50 mmHg  Pulse 84  Temp(Src) 98.2 F (36.8 C) (Oral)  Resp 22  Ht 5' 5.5" (1.664 m)  Wt 68.04 kg (150 lb)  BMI 24.57 kg/m2  SpO2 100% Gen: NAD, alert and oriented x 4 HEENT: anicteric.  Neck: supple, no JVD or thyromegaly Chest: inspiratory wheezes bilaterally.  CV: RRR, no m/g/c/r Abd: soft, NT, ND, +BS in all four quadrants; no HSM, guarding, ridigity, or rebound tenderness Ext: no edema, well perfused with 1+ pulses, Skin: no rash or lesions noted  Data: Lab Results  Component Value Date   WBC 13.7* 06/05/2015   HGB 7.6* 06/05/2015   HCT 23.2* 06/05/2015   MCV 81.1 06/05/2015   PLT 401 06/05/2015     Recent Labs Lab 06/05/15 0813 06/05/15 1238  HGB 8.2* 7.6*   Lab Results  Component Value Date   NA 140 06/05/2015   K 3.7 06/05/2015   CL 101 06/05/2015   CO2 31 06/05/2015   BUN 22* 06/05/2015   CREATININE 0.94 06/05/2015   GLU 82 04/04/2014   Lab Results  Component Value Date   ALT 13* 06/05/2015   AST 26 06/05/2015   ALKPHOS 57 06/05/2015   BILITOT 0.4 06/05/2015    Recent Labs Lab 06/05/15 0813  INR 0.96   Assessment/Plan: Ms. Tallent is a 80 y.o. female with reoccurring GIB that is most likely secondary to gastritis related to Plavix and ASA. Remains a high risk for undergoing a colonoscopy and therefore will follow her closely. No signs of actively bleeding at present, if active occurs, recommend a bleeding scan. See Dr. Nigel Bridgeman note for further recommendations.   Thank you for the consult. Please call with questions or concerns. Patient seen with Dr. Lutricia Feil under collaborative agreement.  Daryll Drown, FNP

## 2015-06-05 NOTE — ED Notes (Signed)
Admitting MD at bedside.

## 2015-06-05 NOTE — Consult Note (Signed)
Pharmacy Antibiotic Note  Rhea PinkMary L Pho is a 80 y.o. female admitted on 06/05/2015 with pneumonia.  Pharmacy has been consulted for vancomycin dosing.  Plan: Vancomycin 1g once then starting 6 hours later 1g q 24 for stacked dosing Vancomycin 1000 IV every 24 hours.  Goal trough 15-20 mcg/mL.  Will check trough at steady state 5/27 @ 1900  Height: 5' 5.5" (166.4 cm) Weight: 150 lb (68.04 kg) IBW/kg (Calculated) : 58.15  Temp (24hrs), Avg:98.2 F (36.8 C), Min:98.2 F (36.8 C), Max:98.2 F (36.8 C)   Recent Labs Lab 06/05/15 0813  WBC 13.7*  CREATININE 0.94    Estimated Creatinine Clearance: 46 mL/min (by C-G formula based on Cr of 0.94).    Allergies  Allergen Reactions  . Codeine Nausea And Vomiting  . Fluticasone-Salmeterol Other (See Comments)    Reaction:  Unknown   . Levofloxacin Other (See Comments)    Reaction:  Confusion and hallucinations   . Tiotropium Bromide Monohydrate Rash    Antimicrobials this admission: zosyn 5/24 >>  vancomycin 5/4 >>   Dose adjustments this admission:   Microbiology results:  5/24 Sputum: needs to be collected   Thank you for allowing pharmacy to be a part of this patient's care.  Olene FlossMelissa D Shizuye Rupert, Pharm.D Clinical Pharmacist   06/05/2015 12:20 PM

## 2015-06-05 NOTE — Consult Note (Signed)
[facility]  CARDIOLOGY CONSULT NOTE  Patient ID: Samantha Goodman MRN: [id number] DOB/AGE: 78/03/1935 78 y.o.  Admit date: 06/05/2015 Referring Physician Dr. Amado CoeGouru Primary Physician Dr. Elease HashimotoMaloney Primary Cardiologist Dr. Lady GaryFath Reason for Consultation gi bleed  HPI: Pt is a 80 yo female with history of copd, cad s/p pci of her left main at [facility] in 2015 with a 4 mm x 12 mm Promus stent. Other disease was non critical. She was recently hospitalized at [facility] with a gi bleed. She was taken off of asa and plavix and has remained off for about 2-3 weeks. She resumed her asa herself and did well. She was seen in our office 4/27 and was placed back on plavix. She did well until 5/11 when she was admitted with cap treated with abx. Her hgb was stable at that time. She noted a large bowel movement of blood on day of admission. She presented to the er where she was noted ot have a hgb of 8.2. She was admitted to the hospital for active gi bleed with melena  and taken off of asa and plavix. She has relative hypotensive but is alert and oriented. [facility]. EKG shows nsr with no ischemia. Troponin is normal.   Review of Systems  Constitutional: Positive for malaise/fatigue.  HENT: Negative.   Eyes: Negative.   Respiratory: Positive for shortness of breath.   Cardiovascular: Negative.   Gastrointestinal: Positive for nausea, vomiting, diarrhea and blood in stool.  Genitourinary: Negative.   Musculoskeletal: Negative.   Skin: Negative.   Neurological: Positive for weakness.  Endo/Heme/Allergies: Negative.   Psychiatric/Behavioral: Negative.     Past Medical History  Diagnosis Date  . COPD (chronic obstructive pulmonary disease) (HCC)   . Asthma   . CAD (coronary artery disease)   . Unstable angina pectoris (HCC)   . Hyperlipemia   . Agitation   . Fall   . Hypothyroid   . CHF (congestive heart failure) (HCC)     Diastolic CHF, EF 14%55%     Family History  Problem Relation Age of Onset  . Stroke Mother   . Prostate cancer Father   . CAD Brother   . Ovarian cancer Sister   . Breast cancer Sister   . Lung cancer Sister     Social History   Social History  . Marital Status: Widowed    Spouse Name: N/A  . Number of Children: N/A  . Years of Education: N/A   Occupational History  . Not on file.   Social History Main Topics  . Smoking status: Former Smoker -- 0.25 packs/day for 65 years    Quit date: 11/11/2013  . Smokeless tobacco: Never Used     Comment: Quit smoking recently  . Alcohol Use: No  . Drug Use: No  . Sexual Activity: Not on file   Other Topics Concern  . Not on file   Social History Narrative   Lives at home by herself. Independent at baseline    Past Surgical History  Procedure Laterality Date  . Eye surgery    . Abdominal hysterectomy    . Appendectomy    . Ankle surgery Right      Prescriptions prior to admission  Medication Sig Dispense Refill Last Dose  . acetaminophen (TYLENOL) 500 MG tablet Take 500-1,000 mg by mouth every 6 (six) hours as needed for mild pain, fever or headache.    prn  . albuterol (  PROVENTIL HFA;VENTOLIN HFA) 108 (90 Base) MCG/ACT inhaler Inhale 2 puffs into the lungs every 4 (four) hours as needed for wheezing or shortness of breath.   prn  . aspirin 81 MG tablet Take 81 mg by mouth daily.   06/05/2015 at 0600  . atorvastatin (LIPITOR) 40 MG tablet Take 40 mg by mouth at bedtime.   06/04/2015 at Unknown time  . budesonide-formoterol (SYMBICORT) 80-4.5 MCG/ACT inhaler Inhale 2 puffs into the lungs 2 (two) times daily.   06/05/2015 at Unknown time  . cholecalciferol (VITAMIN D) 1000 UNITS tablet Take 1,000 Units by mouth daily.   06/05/2015 at Unknown time  . clopidogrel (PLAVIX) 75 MG tablet Take 1 tablet by mouth at bedtime.    06/04/2015 at 1700  . docusate sodium (COLACE) 250 MG capsule Take 250 mg by mouth daily.   06/05/2015 at Unknown time  . ferrous sulfate 325  (65 FE) MG tablet take 1 tablet by mouth every evening 90 tablet 1 06/04/2015 at Unknown time  . fluticasone (FLONASE) 50 MCG/ACT nasal spray Place 2 sprays into both nostrils daily as needed for rhinitis.   prn  . furosemide (LASIX) 20 MG tablet Take 1 tablet (20 mg total) by mouth daily. 90 tablet 3 06/05/2015 at Unknown time  . guaiFENesin (MUCINEX) 600 MG 12 hr tablet Take 1 tablet (600 mg total) by mouth 2 (two) times daily. 20 tablet 0 06/05/2015 at Unknown time  . ipratropium-albuterol (DUONEB) 0.5-2.5 (3) MG/3ML SOLN Take 3 mLs by nebulization 4 (four) times daily as needed (for wheezing/shortness of breath).    prn  . levothyroxine (SYNTHROID, LEVOTHROID) 88 MCG tablet Take 88 mcg by mouth daily before breakfast.   06/05/2015 at Unknown time  . metoprolol succinate (TOPROL-XL) 25 MG 24 hr tablet Take 12.5 mg by mouth daily.   06/05/2015 at 0600  . montelukast (SINGULAIR) 10 MG tablet Take 10 mg by mouth at bedtime.   06/04/2015 at Unknown time  . nicotine polacrilex (RA NICOTINE POLACRILEX) 4 MG gum Take 1 Dose by mouth as needed.   prn  . pantoprazole (PROTONIX) 40 MG tablet Take 40 mg by mouth daily.   06/05/2015 at Unknown time  . polyethylene glycol (MIRALAX / GLYCOLAX) packet Take 17 g by mouth daily.   06/05/2015 at Unknown time  . Potassium 99 MG TABS Take 99 mg by mouth daily.    06/05/2015 at Unknown time  . theophylline (THEO-24) 200 MG 24 hr capsule Take 200 mg by mouth daily.   06/05/2015 at Unknown time    Physical Exam: Blood pressure 113/50, pulse 84, temperature 98.2 F (36.8 C), temperature source Oral, resp. rate 22, height 5' 5.5" (1.664 m), weight 68.04 kg (150 lb), SpO2 100 %.   Wt Readings from Last 1 Encounters:  06/05/15 68.04 kg (150 lb)     General appearance: alert and cooperative Head: Normocephalic, without obvious abnormality, atraumatic Resp: clear to auscultation bilaterally Cardio: regular rate and rhythm GI: abnormal findings:  hyperactive bowel  sounds Pulses: 2+ and symmetric Neurologic: Grossly normal  Labs:   Lab Results  Component Value Date   WBC 13.7* 06/05/2015   HGB 7.6* 06/05/2015   HCT 23.2* 06/05/2015   MCV 81.1 06/05/2015   PLT 401 06/05/2015    Recent Labs Lab 06/05/15 0813  NA 140  K 3.7  CL 101  CO2 31  BUN 22*  CREATININE 0.94  CALCIUM 8.6*  PROT 6.4*  BILITOT 0.4  ALKPHOS 57  ALT 13*  AST 26  GLUCOSE 120*   Lab Results  Component Value Date   CKTOTAL 30 02/03/2014   CKMB 0.6 02/03/2014   TROPONINI <0.03 06/05/2015      Radiology: CXR-small left pleural effusion with left basilar atelectasis. No pulmonary edema. EKG: Sinus tachycardia  ASSESSMENT AND PLAN:  Pt with history of oxygen dependent, theophylline dependent copd and histoyr of cad with lm stent in 2015 who now is admitted with recurrent gi bleed on dual antiplatelet therapy. Had gi bleed several weeks ago and was taken off of plavix and asa. She self resumed her asa and was restarted on plavix. Has been on this for several weeks and now presentes with melena. She has ruled out for an mi. WIll need to come off of her plavix and asa. Further workup per gi. Pt desires transfer to [facility].  Signed: Dalia Heading MD, [facility] 06/05/2015, 2:11 PM

## 2015-06-05 NOTE — Progress Notes (Signed)
Patient ID: Samantha PinkMary L Goodman, female   DOB: 04/05/1935, 80 y.o.   MRN: [id number]  Pt seen and examined. Please see Dorine Martin's notes. Pt with recurrent rectal bleeding. Significant CAD and COPD on chronic O2. Rebled after resuming plavix. Also, on ASA. PLEASE read my notes from 4/16 when pt was admitted with rectal bleeding. Pt is too high risk for colonoscopy. Pt cannot go back on plavix. If pt actively bleeds, then repeat bleeding scan. [facility] Will follow. Thanks.

## 2015-06-05 NOTE — ED Provider Notes (Signed)
[facility] Emergency Department Provider Note   ____________________________________________  Time seen: Approximately 8:35 AM  I have reviewed the triage vital signs and the nursing notes.   HISTORY  Chief Complaint Rectal Bleeding and Shortness of Breath    HPI Samantha Goodman is a 80 y.o. female patient complains of worsening of her baseline shortness of breath and lightheadedness starting last night. She reports she had a large bowel movement of blood which "filled the toilet" early this morning. And she says she is lightheaded and swimmy headed. This also started today. Symptoms are moderately severe. He also complains of inability to lay flat   Past Medical History  Diagnosis Date  . COPD (chronic obstructive pulmonary disease) (HCC)   . Asthma   . CAD (coronary artery disease)   . Unstable angina pectoris (HCC)   . Hyperlipemia   . Agitation   . Fall   . Hypothyroid   . CHF (congestive heart failure) (HCC)     Diastolic CHF, EF 16%    Patient Active Problem List   Diagnosis Date Noted  . COPD exacerbation (HCC) 05/20/2015  . CAD in native artery 05/09/2015  . GI bleed 04/25/2015  . COPD (chronic obstructive pulmonary disease) (HCC) 11/21/2014  . Senile purpura (HCC) 11/21/2014  . Oxygen dependent 07/13/2014  . Acid reflux 07/09/2014  . Allergic rhinitis 07/06/2014  . Anxiety 07/06/2014  . Body mass index (BMI) of 23.0-23.9 in adult 07/06/2014  . CCF (congestive cardiac failure) (HCC) 07/06/2014  . Chronic constipation 07/06/2014  . Dizziness 07/06/2014  . Edema extremities 07/06/2014  . Bleeding gastrointestinal 07/06/2014  . Hypercholesteremia 07/06/2014  . Decreased potassium in the blood 07/06/2014  . Adult hypothyroidism 07/06/2014  . OP (osteoporosis) 07/06/2014  . Procidentia of rectum 07/06/2014  . Tobacco abuse, in remission 07/06/2014  . Anemia 07/04/2014  . Arteriosclerosis of coronary artery 03/09/2013    Past  Surgical History  Procedure Laterality Date  . Eye surgery    . Abdominal hysterectomy    . Appendectomy    . Ankle surgery Right     Current Outpatient Rx  Name  Route  Sig  Dispense  Refill  . acetaminophen (TYLENOL) 500 MG tablet   Oral   Take 500-1,000 mg by mouth every 6 (six) hours as needed for mild pain, fever or headache.          Marland Kitchen aspirin 81 MG tablet   Oral   Take 81 mg by mouth daily.         Marland Kitchen atorvastatin (LIPITOR) 40 MG tablet   Oral   Take 40 mg by mouth at bedtime.         . cholecalciferol (VITAMIN D) 1000 UNITS tablet   Oral   Take 1,000 Units by mouth daily.         . clopidogrel (PLAVIX) 75 MG tablet   Oral   Take 1 tablet by mouth at bedtime.          . docusate sodium (COLACE) 250 MG capsule   Oral   Take 250 mg by mouth daily.         . ferrous sulfate 325 (65 FE) MG tablet      take 1 tablet by mouth every evening   90 tablet   1   . fluticasone (FLONASE) 50 MCG/ACT nasal spray   Each Nare   Place 2 sprays into both nostrils daily as needed for rhinitis.         Marland Kitchen  furosemide (LASIX) 20 MG tablet   Oral   Take 1 tablet (20 mg total) by mouth daily.   90 tablet   3   . guaiFENesin (MUCINEX) 600 MG 12 hr tablet   Oral   Take 1 tablet (600 mg total) by mouth 2 (two) times daily.   20 tablet   0   . ipratropium-albuterol (DUONEB) 0.5-2.5 (3) MG/3ML SOLN   Nebulization   Take 3 mLs by nebulization 4 (four) times daily as needed (for wheezing/shortness of breath).          Marland Kitchen. levothyroxine (SYNTHROID, LEVOTHROID) 88 MCG tablet   Oral   Take 88 mcg by mouth daily before breakfast.         . LORazepam (ATIVAN) 0.5 MG tablet   Oral   Take 0.5 mg by mouth every 6 (six) hours as needed for anxiety.         . metoprolol succinate (TOPROL-XL) 25 MG 24 hr tablet   Oral   Take 12.5 mg by mouth daily.         . montelukast (SINGULAIR) 10 MG tablet   Oral   Take 10 mg by mouth at bedtime.         . nicotine  polacrilex (RA NICOTINE POLACRILEX) 4 MG gum   Oral   Take 1 Dose by mouth as needed.         . pantoprazole (PROTONIX) 40 MG tablet   Oral   Take 40 mg by mouth daily.         . polyethylene glycol (MIRALAX / GLYCOLAX) packet   Oral   Take 17 g by mouth daily.         . Potassium 99 MG TABS   Oral   Take 99 mg by mouth daily.          . predniSONE (STERAPRED UNI-PAK 21 TAB) 10 MG (21) TBPK tablet      Take 6 tabs first day, 5 tab on day 2, then 4 on day 3rd, 3 tabs on day 4th , 2 tab on day 5th, and 1 tab on 6th day.   21 tablet   0   . PROAIR HFA 108 (90 Base) MCG/ACT inhaler      inhale 2 puffs by mouth every 4 hours if needed for shortness of breath or wheezing   1 Inhaler   5   . SYMBICORT 80-4.5 MCG/ACT inhaler      inhale 2 puffs by mouth twice a day   10.2 Inhaler   5     Manufacturer Coupon Available.   Marland Kitchen. THEO-24 200 MG 24 hr capsule      take 1 capsule by mouth once daily   30 capsule   5   . theophylline (THEO-24) 200 MG 24 hr capsule   Oral   Take 200 mg by mouth daily.           Allergies Codeine; Fluticasone-salmeterol; Levofloxacin; and Tiotropium bromide monohydrate  Family History  Problem Relation Age of Onset  . Stroke Mother   . Prostate cancer Father   . CAD Brother   . Ovarian cancer Sister   . Breast cancer Sister   . Lung cancer Sister     Social History Social History  Substance Use Topics  . Smoking status: Former Smoker -- 0.25 packs/day for 65 years    Quit date: 11/11/2013  . Smokeless tobacco: Never Used     Comment: Quit smoking recently  .  Alcohol Use: No    Review of Systems Constitutional: No fever/chills Eyes: No visual changes. ENT: No sore throat. Cardiovascular: Denies chest pain. Respiratory:  shortness of breath. Gastrointestinal: No abdominal pain.  No nausea, no vomiting.  No diarrhea.  No constipation. Genitourinary: Negative for dysuria. Musculoskeletal: Negative for back pain. Skin:  Negative for rash. Neurological: Negative for headaches, focal weakness or numbness.  10-point ROS otherwise negative.  ____________________________________________   PHYSICAL EXAM:  VITAL SIGNS: ED Triage Vitals  Enc Vitals Group     BP 06/05/15 0810 104/64 mmHg     Pulse Rate 06/05/15 0810 99     Resp 06/05/15 0810 28     Temp 06/05/15 0810 98.2 F (36.8 C)     Temp Source 06/05/15 0810 Oral     SpO2 06/05/15 0810 95 %     Weight 06/05/15 0810 150 lb (68.04 kg)     Height 06/05/15 0810 5' 5.5" (1.664 m)     Head Cir --      Peak Flow --      Pain Score 06/05/15 0808 0     Pain Loc --      Pain Edu? --      Excl. in GC? --     Constitutional: Alert and oriented. He is breathing hard and looks ill. Eyes: Conjunctivae are normal. PERRL. EOMI. Head: Atraumatic. Nose: No congestion/rhinnorhea. Mouth/Throat: Mucous membranes are moist.  Oropharynx non-erythematous. Neck: No stridor.  Cardiovascular: Normal rate, regular rhythm. Grossly normal heart sounds.  Good peripheral circulation. Respiratory: Normal respiratory effort. Patient's tachypnea. She has scattered crackles in her lungs occasional wheezes. Gastrointestinal: Soft and nontender. No distention. No abdominal bruits. No CVA tenderness. Musculoskeletal: No lower extremity tenderness nor edema.  No joint effusions. Neurologic:  Normal speech and language. No gross focal neurologic deficits are appreciated. No gait instability. Skin:  Skin is warm, dry and intact. No rash noted. Psychiatric: Mood and affect are normal. Speech and behavior are normal. Rectal exam shows maroon stool ____________________________________________   LABS (all labs ordered are listed, but only abnormal results are displayed)  Labs Reviewed  CBC WITH DIFFERENTIAL/PLATELET - Abnormal; Notable for the following:    WBC 13.7 (*)    RBC 3.18 (*)    Hemoglobin 8.2 (*)    HCT 25.8 (*)    MCH 25.9 (*)    RDW 14.8 (*)    Neutro Abs 11.0  (*)    All other components within normal limits  BRAIN NATRIURETIC PEPTIDE  COMPREHENSIVE METABOLIC PANEL  PROTIME-INR  TROPONIN I  POC OCCULT BLOOD, ED  TYPE AND SCREEN   ____________________________________________  EKG  EKG read and interpreted by me shows normal sinus rhythm rate of 96 right bundle-branch block the computer is reading short PR interval EKG looks similar to one from 05/20/2015. ____________________________________________  RADIOLOGY  Chest x-ray shows atelectasis on the right base possible effusion on the left side diaphragm is obscured ____________________________________________   PROCEDURES    ____________________________________________   INITIAL IMPRESSION / ASSESSMENT AND PLAN / ED COURSE  Pertinent labs & imaging results that were available during my care of the patient were reviewed by me and considered in my medical decision making (see chart for details).   ____________________________________________   FINAL CLINICAL IMPRESSION(S) / ED DIAGNOSES  Final diagnoses:  Gastrointestinal hemorrhage with melena  Dyspnea  Actual diagnosis is gastroenteritis intestinal bleed with hematochezia    NEW MEDICATIONS STARTED DURING THIS VISIT:  New Prescriptions   No medications  on file     Note:  This document was prepared using Dragon voice recognition software and may include unintentional dictation errors.    Arnaldo Natal, MD 06/05/15 [phone]

## 2015-06-06 LAB — CBC
HEMATOCRIT: 26.3 % — AB (ref 35.0–47.0)
HEMOGLOBIN: 8.7 g/dL — AB (ref 12.0–16.0)
MCH: 26.7 pg (ref 26.0–34.0)
MCHC: 33.2 g/dL (ref 32.0–36.0)
MCV: 80.4 fL (ref 80.0–100.0)
Platelets: 333 10*3/uL (ref 150–440)
RBC: 3.27 MIL/uL — ABNORMAL LOW (ref 3.80–5.20)
RDW: 15.3 % — ABNORMAL HIGH (ref 11.5–14.5)
WBC: 11.4 10*3/uL — ABNORMAL HIGH (ref 3.6–11.0)

## 2015-06-06 LAB — COMPREHENSIVE METABOLIC PANEL
ALBUMIN: 3.6 g/dL (ref 3.5–5.0)
ALK PHOS: 56 U/L (ref 38–126)
ALT: 13 U/L — ABNORMAL LOW (ref 14–54)
ANION GAP: 8 (ref 5–15)
AST: 23 U/L (ref 15–41)
BILIRUBIN TOTAL: 1.8 mg/dL — AB (ref 0.3–1.2)
BUN: 22 mg/dL — AB (ref 6–20)
CALCIUM: 8.5 mg/dL — AB (ref 8.9–10.3)
CO2: 31 mmol/L (ref 22–32)
Chloride: 102 mmol/L (ref 101–111)
Creatinine, Ser: 0.95 mg/dL (ref 0.44–1.00)
GFR calc Af Amer: >60 mL/min (ref >60)
GFR, EST NON AFRICAN AMERICAN: 56 mL/min — AB (ref >60)
GLUCOSE: 100 mg/dL — AB (ref 65–99)
Potassium: 2.8 mmol/L — CL (ref 3.5–5.1)
Sodium: 141 mmol/L (ref 135–145)
TOTAL PROTEIN: 6 g/dL — AB (ref 6.5–8.1)

## 2015-06-06 LAB — TYPE AND SCREEN
ABO/RH(D): B POS
ANTIBODY SCREEN: NEGATIVE
Unit division: 0

## 2015-06-06 LAB — PROTIME-INR
INR: 1.04
Prothrombin Time: 13.8 seconds (ref 11.4–15.0)

## 2015-06-06 LAB — MAGNESIUM: Magnesium: 1.9 mg/dL (ref 1.7–2.4)

## 2015-06-06 LAB — POTASSIUM: POTASSIUM: 3.8 mmol/L (ref 3.5–5.1)

## 2015-06-06 MED ORDER — POTASSIUM CHLORIDE 20 MEQ PO PACK
40.0000 meq | PACK | Freq: Once | ORAL | Status: AC
  Administered 2015-06-06: 40 meq via ORAL
  Filled 2015-06-06: qty 2

## 2015-06-06 MED ORDER — PREDNISONE 20 MG PO TABS
50.0000 mg | ORAL_TABLET | Freq: Every day | ORAL | Status: DC
  Administered 2015-06-07: 50 mg via ORAL
  Filled 2015-06-06: qty 2

## 2015-06-06 MED ORDER — POTASSIUM CHLORIDE 10 MEQ/100ML IV SOLN
10.0000 meq | INTRAVENOUS | Status: AC
  Administered 2015-06-06 (×4): 10 meq via INTRAVENOUS
  Filled 2015-06-06 (×4): qty 100

## 2015-06-06 MED ORDER — METHYLPREDNISOLONE SODIUM SUCC 125 MG IJ SOLR
60.0000 mg | Freq: Once | INTRAMUSCULAR | Status: AC
  Administered 2015-06-06: 60 mg via INTRAVENOUS
  Filled 2015-06-06: qty 2

## 2015-06-06 NOTE — Progress Notes (Signed)
Pt K+ critical per lab, MD notified orders placed for supplement

## 2015-06-06 NOTE — Care Management (Signed)
patient presents from home and is a readmit within 30 days.  Prior admission was for copd/pneumonia and presented this admission with shortness of breath.  She was found to be anemic and required one unit of blood.  discharge order was present but when CM spoke with patient, she appeared short of breath.  Attending has cancelled discharge and will add IV steroids to treatment regime.  Patient does have home 02 and a home nebulizer.  Ag[facility] preference for home health nurse is [facility].  [facility] provides her [id number] and has provided nurse in the past.  heads up referral to [facility]

## 2015-06-06 NOTE — Progress Notes (Signed)
[facility] [place] [facility]   PATIENT NAME: Samantha KhatMary Goodman    MR#:  [id number]  DATE OF BIRTH:  11/11/1935  SUBJECTIVE:  CHIEF COMPLAINT:  Patient is still coughing and short of breath with minimal exertion reporting wheezing Denies any other episodes of bleeding  REVIEW OF SYSTEMS:  CONSTITUTIONAL: No fever, fatigue or weakness.  EYES: No blurred or double vision.  EARS, NOSE, AND THROAT: No tinnitus or ear pain.  RESPIRATORY: Reporting productive cough, exertional shortness of breath, wheezing , denies hemoptysis.  CARDIOVASCULAR: No chest pain, orthopnea, edema.  GASTROINTESTINAL: No nausea, vomiting, diarrhea or abdominal pain.  GENITOURINARY: No dysuria, hematuria.  ENDOCRINE: No polyuria, nocturia,  HEMATOLOGY: No anemia, easy bruising or bleeding SKIN: No rash or lesion. MUSCULOSKELETAL: No joint pain or arthritis.   NEUROLOGIC: No tingling, numbness, weakness.  PSYCHIATRY: No anxiety or depression.   DRUG ALLERGIES:   Allergies  Allergen Reactions  . Codeine Nausea And Vomiting  . Fluticasone-Salmeterol Other (See Comments)    Reaction:  Unknown   . Levofloxacin Other (See Comments)    Reaction:  Confusion and hallucinations   . Tiotropium Bromide Monohydrate Rash    VITALS:  Blood pressure 105/50, pulse 93, temperature 98.3 F (36.8 C), temperature source Oral, resp. rate 20, height 5' 5.5" (1.664 m), weight 68.04 kg (150 lb), SpO2 98 %.  PHYSICAL EXAMINATION:  GENERAL:  80 y.o.-year-old patient lying in the bed with no acute distress.  EYES: Pupils equal, round, reactive to light and accommodation. No scleral icterus. Extraocular muscles intact.  HEENT: Head atraumatic, normocephalic. Oropharynx and nasopharynx clear.  NECK:  Supple, no jugular venous distention. No thyroid enlargement, no tenderness.  LUNGS: Moderate coarse bronchial breath sounds bilaterally, end expiratory wheezing, no rales,rhonchi or crepitation. No use of  accessory muscles of respiration.  CARDIOVASCULAR: S1, S2 normal. No murmurs, rubs, or gallops.  ABDOMEN: Soft, nontender, nondistended. Bowel sounds present. No organomegaly or mass.  EXTREMITIES: No pedal edema, cyanosis, or clubbing.  NEUROLOGIC: Cranial nerves II through XII are intact. Muscle strength 5/5 in all extremities. Sensation intact. Gait not checked.  PSYCHIATRIC: The patient is alert and oriented x 3.  SKIN: No obvious rash, lesion, or ulcer.    LABORATORY PANEL:   CBC  Recent Labs Lab 06/06/15 0202  WBC 11.4*  HGB 8.7*  HCT 26.3*  PLT 333   ------------------------------------------------------------------------------------------------------------------  Chemistries   Recent Labs Lab 06/06/15 0202 06/06/15 1444  NA 141  --   K 2.8* 3.8  CL 102  --   CO2 31  --   GLUCOSE 100*  --   BUN 22*  --   CREATININE 0.95  --   CALCIUM 8.5*  --   MG 1.9  --   AST 23  --   ALT 13*  --   ALKPHOS 56  --   BILITOT 1.8*  --    ------------------------------------------------------------------------------------------------------------------  Cardiac Enzymes  Recent Labs Lab 06/05/15 0813  TROPONINI <0.03   ------------------------------------------------------------------------------------------------------------------  RADIOLOGY:  Dg Chest Portable 1 View  06/05/2015  CLINICAL DATA:  Worsening shortness of Breath, difficulty breathing EXAM: PORTABLE CHEST 1 VIEW COMPARISON:  5/8/7 FINDINGS: Hyperinflation again noted. Small left pleural effusion with left basilar atelectasis or infiltrate. Trace right pleural effusion with right basilar atelectasis. No pulmonary edema. Again noted old fracture of the left eighth rib. IMPRESSION: Small left pleural effusion with left basilar atelectasis or infiltrate. Trace right pleural effusion with right basilar atelectasis. No pulmonary edema. Electronically  Signed   By: Natasha Mead M.D.   On: 06/05/2015 08:44    EKG:    Orders placed or performed during the hospital encounter of 06/05/15  . EKG 12-Lead  . EKG 12-Lead  . ED EKG  . ED EKG  . ED EKG  . ED EKG    ASSESSMENT AND PLAN:    Samantha Goodman is a 80 y.o. female with a known history of COPD, hyperlipidemia, coronary artery disease was CHF and hypothyroidism and multiple other medical problems is presenting to the ED with a chief complaint of shortness of breath or chest pain started since morning and has been progressively getting worse. Also patient has noticed bowel movement with large amount of maroon-colored blood. Patient was feeling dizzy and came into the ED. He is stool for Hemoccult was positive. Chest x-ray for possible infiltrate. Patient was recently admitted to the hospital on May 8 for COPD exacerbation and was discharged with prednisone and antibiotics. Patient was also recently admitted for GI bleed and at that time her aspirin and Plavix were discontinued.   # Shortness of breath secondary to healthcare associated pneumonia and AECOPD Admit patient to MedSurg unit Sputum culture and sensitivity is ordered Patient is started on Zosyn and vancomycin after blood cultures were obtained in the ED, will continue the same Provide breathing treatments as needed basis  #Hematochezia probably from aspirin and Plavix Discontinue aspirin and Plavix at this time, discussed with both GI and cardiology Dr. Lady Gary. Patient is agreeable to discontinue both aspirin and Plavix GI is not considering any procedures. I recommended to discontinue aspirin and Plavix. Advance diet as tolerated Monitor hemoglobin and crit closely and transfuse as needed  #History of coronary artery disease status post stents in 2015 D/c aspirin and Plavix in view of current GI bleed Cardiology consult is placed and discussed with Dr.fath, recommended to discontinue both aspirin and Plavix at this time and outpatient follow-up  #COPD currently WITH mild  exacerbation. Start patient on steroids  Continue breathing treatments Patient is on antibiotics  #History of congestive heart failure not currently fluid overloaded Monitor patient's intake and output and for symptoms and signs of fluid overload Provided gentle hydration of IV fluids 500 mL only Hold Lasix in view of borderline blood pressure   #Hypothyroidism continue Synthroid   Provide GI and DVT prophylaxis with SCDs    All the records are reviewed and case discussed with Care Management/Social Workerr. Management plans discussed with the patient, family and they are in agreement.  CODE STATUS:   TOTAL TIME TAKING CARE OF THIS PATIENT: 35 minutes.   POSSIBLE D/C IN 2  DAYS, DEPENDING ON CLINICAL CONDITION.   Ramonita Lab M.D on 06/06/2015 at 4:03 PM  Between 7am to 6pm - Pager - [phone] After 6pm go to [email or web address] - password [email or web address] [facility]  [place] [facility]  Office  [phone]  CC: Primary care physician; Lorie Phenix, MD

## 2015-06-07 LAB — EXPECTORATED SPUTUM ASSESSMENT W REFEX TO RESP CULTURE

## 2015-06-07 LAB — EXPECTORATED SPUTUM ASSESSMENT W GRAM STAIN, RFLX TO RESP C

## 2015-06-07 MED ORDER — PREDNISONE 20 MG PO TABS
40.0000 mg | ORAL_TABLET | Freq: Every day | ORAL | Status: DC
  Administered 2015-06-08: 40 mg via ORAL
  Filled 2015-06-07: qty 2

## 2015-06-07 MED ORDER — POTASSIUM CHLORIDE CRYS ER 20 MEQ PO TBCR
20.0000 meq | EXTENDED_RELEASE_TABLET | Freq: Every day | ORAL | Status: DC
  Administered 2015-06-07 – 2015-06-10 (×4): 20 meq via ORAL
  Filled 2015-06-07 (×5): qty 1

## 2015-06-07 MED ORDER — MOMETASONE FURO-FORMOTEROL FUM 100-5 MCG/ACT IN AERO
2.0000 | INHALATION_SPRAY | Freq: Two times a day (BID) | RESPIRATORY_TRACT | Status: DC
  Administered 2015-06-07 – 2015-06-13 (×12): 2 via RESPIRATORY_TRACT
  Filled 2015-06-07: qty 8.8

## 2015-06-07 MED ORDER — FUROSEMIDE 20 MG PO TABS
20.0000 mg | ORAL_TABLET | Freq: Every day | ORAL | Status: DC
  Administered 2015-06-07 – 2015-06-09 (×3): 20 mg via ORAL
  Filled 2015-06-07 (×3): qty 1

## 2015-06-07 NOTE — Care Management Important Message (Signed)
Important Message  Patient Details  Name: Samantha Goodman MRN: [id number] Date of Birth: 07/02/1935   Medicare Important Message Given:  Yes    Eber HongGreene, Kaydra Borgen R, RN 06/07/2015, 3:16 PM

## 2015-06-07 NOTE — Evaluation (Signed)
Physical Therapy Evaluation Patient Details Name: Rhea PinkMary L Whalley MRN: [id number] DOB: 12/14/1935 Today's Date: 06/07/2015   History of Present Illness  80 yo F presented to ED on 5/24 for a rectal bleed, found to have a lower GI bleed. She was recently admitted to the hospital 5/8 for a COPD exacerbation. PMH includes, COPD, CAD, agitation and CHF.  Clinical Impression  Pt demonstrates no functional deficits at this time. She is independent with all mobility. Pt ambulated up to 375 ft without AD with good speed and stability. She is at her baseline LOF. Maintained SpO2 95 to 97% on 2L during session. Pt has no skilled PT needs at this time and orders will be completed. New orders will be needed if a need arises in the future.    Follow Up Recommendations No PT follow up    Equipment Recommendations  None recommended by PT    Recommendations for Other Services       Precautions / Restrictions Precautions Precautions: None Restrictions Weight Bearing Restrictions: No      Mobility  Bed Mobility Overal bed mobility: Independent                Transfers Overall transfer level: Independent Equipment used: None             General transfer comment: no difficulties  Ambulation/Gait Ambulation/Gait assistance: Independent Ambulation Distance (Feet): 375 Feet Assistive device: None Gait Pattern/deviations: Narrow base of support Gait velocity: WFL Gait velocity interpretation: at or above normal speed for age/gender General Gait Details: Good speed and stability. SpO2 remained 95 to 97% on 2L. no deficits  Stairs            Wheelchair Mobility    Modified Rankin (Stroke Patients Only)       Balance Overall balance assessment: Independent                                           Pertinent Vitals/Pain Pain Assessment: No/denies pain    Home Living Family/patient expects to be discharged to:: Private residence Living Arrangements:  Alone Available Help at Discharge: Family;Available PRN/intermittently Type of Home: House Home Access: Level entry     Home Layout: One level   Additional Comments: Home O2 (2L continuous)     Prior Function Level of Independence: Independent         Comments: Indep with household and community mobility without assist device; + driving; denies fall history.     Hand Dominance        Extremity/Trunk Assessment   Upper Extremity Assessment: Overall WFL for tasks assessed           Lower Extremity Assessment: Overall WFL for tasks assessed         Communication   Communication: HOH  Cognition Arousal/Alertness: Awake/alert Behavior During Therapy: WFL for tasks assessed/performed Overall Cognitive Status: Within Functional Limits for tasks assessed                      General Comments General comments (skin integrity, edema, etc.): SOB, congestion, 2LO2    Exercises        Assessment/Plan    PT Assessment Patent does not need any further PT services  PT Diagnosis     PT Problem List    PT Treatment Interventions     PT Goals (Current goals can be found  in the Care Plan section) Acute Rehab PT Goals Patient Stated Goal: To return home  PT Goal Formulation: With patient Time For Goal Achievement: 06/21/15 Potential to Achieve Goals: Good    Frequency     Barriers to discharge        Co-evaluation               End of Session Equipment Utilized During Treatment: Gait belt;Oxygen Activity Tolerance: Patient tolerated treatment well Patient left: in bed;with call bell/phone within reach Nurse Communication: Mobility status         Time: 1049-1100 PT Time Calculation (min) (ACUTE ONLY): 11 min   Charges:   PT Evaluation $PT Eval Low Complexity: 1 Procedure     PT G Codes:        Adelene Idler, PT, DPT  06/07/2015, 11:13 AM [phone]

## 2015-06-07 NOTE — Progress Notes (Signed)
[facility] [place] [facility]   PATIENT NAME: Samantha Goodman    MR#:  [id number]  DATE OF BIRTH:  08/31/1935  SUBJECTIVE:  CHIEF COMPLAINT:  Patient is still coughing and short of breath with minimal exertion reporting wheezing, Initiated on prednisone, blood glucose levels are high Denies any other episodes of bleeding except on admission  REVIEW OF SYSTEMS:  CONSTITUTIONAL: No fever, fatigue or weakness.  EYES: No blurred or double vision.  EARS, NOSE, AND THROAT: No tinnitus or ear pain.  RESPIRATORY: Reporting productive cough, exertional shortness of breath, wheezing , denies hemoptysis.  CARDIOVASCULAR: No chest pain, orthopnea, edema.  GASTROINTESTINAL: No nausea, vomiting, diarrhea or abdominal pain.  GENITOURINARY: No dysuria, hematuria.  ENDOCRINE: No polyuria, nocturia,  HEMATOLOGY: No anemia, easy bruising or bleeding SKIN: No rash or lesion. MUSCULOSKELETAL: No joint pain or arthritis.   NEUROLOGIC: No tingling, numbness, weakness.  PSYCHIATRY: No anxiety or depression.   DRUG ALLERGIES:   Allergies  Allergen Reactions  . Codeine Nausea And Vomiting  . Fluticasone-Salmeterol Other (See Comments)    Reaction:  Unknown   . Levofloxacin Other (See Comments)    Reaction:  Confusion and hallucinations   . Tiotropium Bromide Monohydrate Rash    VITALS:  Blood pressure 101/44, pulse 92, temperature 98.3 F (36.8 C), temperature source Oral, resp. rate 18, height 5' 5.5" (1.664 m), weight 68.04 kg (150 lb), SpO2 94 %.  PHYSICAL EXAMINATION:  GENERAL:  80 y.o.-year-old patient lying in the bed with no acute distress.  EYES: Pupils equal, round, reactive to light and accommodation. No scleral icterus. Extraocular muscles intact.  HEENT: Head atraumatic, normocephalic. Oropharynx and nasopharynx clear.  NECK:  Supple, no jugular venous distention. No thyroid enlargement, no tenderness.  LUNGS: Moderate coarse bronchial breath sounds  bilaterally, end expiratory wheezing, no rales,rhonchi or crepitation. No use of accessory muscles of respiration.  CARDIOVASCULAR: S1, S2 normal. No murmurs, rubs, or gallops.  ABDOMEN: Soft, nontender, nondistended. Bowel sounds present. No organomegaly or mass.  EXTREMITIES: No pedal edema, cyanosis, or clubbing.  NEUROLOGIC: Cranial nerves II through XII are intact. Muscle strength 5/5 in all extremities. Sensation intact. Gait not checked.  PSYCHIATRIC: The patient is alert and oriented x 3.  SKIN: No obvious rash, lesion, or ulcer.    LABORATORY PANEL:   CBC  Recent Labs Lab 06/06/15 0202  WBC 11.4*  HGB 8.7*  HCT 26.3*  PLT 333   ------------------------------------------------------------------------------------------------------------------  Chemistries   Recent Labs Lab 06/06/15 0202 06/06/15 1444  NA 141  --   K 2.8* 3.8  CL 102  --   CO2 31  --   GLUCOSE 100*  --   BUN 22*  --   CREATININE 0.95  --   CALCIUM 8.5*  --   MG 1.9  --   AST 23  --   ALT 13*  --   ALKPHOS 56  --   BILITOT 1.8*  --    ------------------------------------------------------------------------------------------------------------------  Cardiac Enzymes  Recent Labs Lab 06/05/15 0813  TROPONINI <0.03   ------------------------------------------------------------------------------------------------------------------  RADIOLOGY:  No results found.  EKG:   Orders placed or performed during the hospital encounter of 06/05/15  . EKG 12-Lead  . EKG 12-Lead  . ED EKG  . ED EKG  . ED EKG  . ED EKG    ASSESSMENT AND PLAN:    Samantha KhatMary Leja is a 80 y.o. female with a known history of COPD, hyperlipidemia, coronary artery disease was CHF and hypothyroidism  and multiple other medical problems is presenting to the ED with a chief complaint of shortness of breath or chest pain started since morning and has been progressively getting worse. Also patient has noticed bowel movement  with large amount of maroon-colored blood. Patient was feeling dizzy and came into the ED. He is stool for Hemoccult was positive. Chest x-ray for possible infiltrate. Patient was recently admitted to the hospital on May 8 for COPD exacerbation and was discharged with prednisone and antibiotics. Patient was also recently admitted for GI bleed and at that time her aspirin and Plavix were discontinued.   # Acute respiratory distress with hypoxia, dyspnea secondary to healthcare associated pneumonia and AECOPD Sputum culture and sensitivity is pending, MRSA by PCR is negative Patient is started on Zosyn and vancomycin after blood cultures were obtained in the ED, will discontinue vancomycin and will continue Zosyn  Provide breathing treatments as needed basis  #Hematochezia probably from aspirin and Plavix Discontinue aspirin and Plavix at this time, discussed with both GI and cardiology Dr. Lady Gary. GI is not considering any procedures. Patient's diet was advanced to soft diet, follow for any bleeding, follow hemoglobin level tomorrow morning, possible discharge home tomorrow if all stable. Monitoring hemoglobin and crit closely and transfuse as needed, patient was transfused 1 packed red blood cell unit on admission  #History of coronary artery disease status post stents in 2015 Off aspirin and Plavix in view of current GI bleed Cardiology consult is placed and discussed with Dr.fath, recommended to discontinue both aspirin and Plavix at this time and outpatient follow-up  #COPD currently WITH mild exacerbation. Taper steroids, give 40 mg orally once tomorrow  Continue breathing treatments Patient is on antibiotics  #History of congestive heart failure with bilateral pleural effusions, resume Lasix at low dose, follow ins and outs, oxygenation. Follow blood pressure readings also very close as patient's blood pressure is marginal. Holding metoprolol    #Hypothyroidism continue  Synthroid   Provide GI and DVT prophylaxis with SCDs    All the records are reviewed and case discussed with Care Management/Social Workerr. Management plans discussed with the patient, family and they are in agreement.  CODE STATUS:   TOTAL TIME TAKING CARE OF THIS PATIENT: 35 minutes.   POSSIBLE D/C IN 2  DAYS, DEPENDING ON CLINICAL CONDITION.   Katharina Caper M.D on 06/07/2015 at 4:28 PM  Between 7am to 6pm - Pager - [phone] After 6pm go to [email or web address] - password [email or web address] [facility]  [place] [facility]  Office  [phone]  CC: Primary care physician; Lorie Phenix, MD

## 2015-06-08 ENCOUNTER — Inpatient Hospital Stay: Payer: Medicare Other

## 2015-06-08 LAB — HEMOGLOBIN
HEMOGLOBIN: 7.7 g/dL — AB (ref 12.0–16.0)
Hemoglobin: 8.8 g/dL — ABNORMAL LOW (ref 12.0–16.0)

## 2015-06-08 LAB — MAGNESIUM: MAGNESIUM: 1.9 mg/dL (ref 1.7–2.4)

## 2015-06-08 MED ORDER — FUROSEMIDE 10 MG/ML IJ SOLN: 20.0000 mg | Freq: Once | INTRAMUSCULAR | Status: DC

## 2015-06-08 MED ORDER — POTASSIUM CHLORIDE 20 MEQ PO PACK: 40.0000 meq | PACK | Freq: Once | ORAL | Status: DC

## 2015-06-08 MED ORDER — METOPROLOL TARTRATE 25 MG PO TABS
12.5000 mg | ORAL_TABLET | Freq: Two times a day (BID) | ORAL | Status: DC
  Administered 2015-06-09 – 2015-06-12 (×3): 12.5 mg via ORAL
  Filled 2015-06-08 (×9): qty 1

## 2015-06-08 MED ORDER — AMOXICILLIN-POT CLAVULANATE 875-125 MG PO TABS
1.0000 | ORAL_TABLET | Freq: Two times a day (BID) | ORAL | Status: DC
  Administered 2015-06-08 – 2015-06-09 (×3): 1 via ORAL
  Filled 2015-06-08 (×3): qty 1

## 2015-06-08 MED ORDER — FUROSEMIDE 10 MG/ML IJ SOLN
20.0000 mg | Freq: Once | INTRAMUSCULAR | Status: AC
  Administered 2015-06-08: 20 mg via INTRAVENOUS
  Filled 2015-06-08: qty 2

## 2015-06-08 MED ORDER — POTASSIUM CHLORIDE 20 MEQ PO PACK
20.0000 meq | PACK | Freq: Once | ORAL | Status: AC
  Administered 2015-06-08: 20 meq via ORAL
  Filled 2015-06-08: qty 1

## 2015-06-08 NOTE — Progress Notes (Signed)
[facility] - [place] at [facility]   PATIENT NAME: Samantha Goodman    MR#:  [id number]  DATE OF BIRTH:  [birth year]  SUBJECTIVE:  CHIEF COMPLAINT:  Patient is coughing and is very short of breath today, rattling in the chest, With no significant sputum production. Wheezing has subsided with steroids. Repeated chest x-ray 27th of May 2017 revealed small left pleural effusion Black stools reported on 06/07/2015, hemoglobin level is 7.7, down from 8.7 yesterday  REVIEW OF SYSTEMS:  CONSTITUTIONAL: No fever, fatigue or weakness.  EYES: No blurred or double vision.  EARS, NOSE, AND THROAT: No tinnitus or ear pain.  RESPIRATORY: Reporting productive cough, exertional shortness of breath, wheezing , denies hemoptysis.  CARDIOVASCULAR: No chest pain, orthopnea, edema.  GASTROINTESTINAL: No nausea, vomiting, diarrhea or abdominal pain.  GENITOURINARY: No dysuria, hematuria.  ENDOCRINE: No polyuria, nocturia,  HEMATOLOGY: No anemia, easy bruising or bleeding SKIN: No rash or lesion. MUSCULOSKELETAL: No joint pain or arthritis.   NEUROLOGIC: No tingling, numbness, weakness.  PSYCHIATRY: No anxiety or depression.   DRUG ALLERGIES:   Allergies  Allergen Reactions  . Codeine Nausea And Vomiting  . Fluticasone-Salmeterol Other (See Comments)    Reaction:  Unknown   . Levofloxacin Other (See Comments)    Reaction:  Confusion and hallucinations   . Tiotropium Bromide Monohydrate Rash    VITALS:  Blood pressure 117/57, pulse 107, temperature 98.6 F (37 C), temperature source Oral, resp. rate 30, height 5' 5.5" (1.664 m), weight 68.04 kg (150 lb), SpO2 94 %.  PHYSICAL EXAMINATION:  GENERAL:  80 y.o.-year-old patient lying in the bed In moderate respiratory distress., Coughing or short of breath  EYES: Pupils equal, round, reactive to light and accommodation. No scleral icterus. Extraocular muscles intact.  HEENT: Head atraumatic, normocephalic. Oropharynx and  nasopharynx clear.  NECK:  Supple, no jugular venous distention. No thyroid enlargement, no tenderness.  LUNGS: Moderate coarse bronchial breath sounds bilaterally, no wheezing, bilateral, mostly on the left rales,rhonchi or crepitation. Using accessory muscles of respiration, very uncomfortable.  CARDIOVASCULAR: S1, S2 normal. No murmurs, rubs, or gallops.  ABDOMEN: Soft, nontender, nondistended. Bowel sounds present. No organomegaly or mass.  EXTREMITIES: No pedal edema, cyanosis, or clubbing.  NEUROLOGIC: Cranial nerves II through XII are intact. Muscle strength 5/5 in all extremities. Sensation intact. Gait not checked.  PSYCHIATRIC: The patient is alert and oriented x 3.  SKIN: No obvious rash, lesion, or ulcer.    LABORATORY PANEL:   CBC  Recent Labs Lab 06/06/15 0202 06/08/15 1018  WBC 11.4*  --   HGB 8.7* 7.7*  HCT 26.3*  --   PLT 333  --    ------------------------------------------------------------------------------------------------------------------  Chemistries   Recent Labs Lab 06/06/15 0202 06/06/15 1444  NA 141  --   K 2.8* 3.8  CL 102  --   CO2 31  --   GLUCOSE 100*  --   BUN 22*  --   CREATININE 0.95  --   CALCIUM 8.5*  --   MG 1.9  --   AST 23  --   ALT 13*  --   ALKPHOS 56  --   BILITOT 1.8*  --    ------------------------------------------------------------------------------------------------------------------  Cardiac Enzymes  Recent Labs Lab 06/05/15 0813  TROPONINI <0.03   ------------------------------------------------------------------------------------------------------------------  RADIOLOGY:  Dg Chest Port 1 View  06/08/2015  CLINICAL DATA:  Rectal bleeding, shortness of breath EXAM: PORTABLE CHEST 1 VIEW COMPARISON:  06/05/2015 FINDINGS: The lungs are hyperinflated likely secondary to  COPD. There is small left pleural effusion. There is no focal consolidation. There is no pneumothorax. There is no right pleural effusion. The  heart and mediastinal contours are unremarkable. The osseous structures are unremarkable. IMPRESSION: 1. Small left pleural effusion. Electronically Signed   By: Elige Ko   On: 06/08/2015 12:00    EKG:   Orders placed or performed during the hospital encounter of 06/05/15  . EKG 12-Lead  . EKG 12-Lead  . ED EKG  . ED EKG  . ED EKG  . ED EKG    ASSESSMENT AND PLAN:    Samantha Goodman is a 80 y.o. female with a known history of COPD, hyperlipidemia, coronary artery disease was CHF and hypothyroidism and multiple other medical problems is presenting to the ED with a chief complaint of shortness of breath or chest pain started since morning and has been progressively getting worse. Also patient has noticed bowel movement with large amount of maroon-colored blood. Patient was feeling dizzy and came into the ED. He is stool for Hemoccult was positive. Chest x-ray for possible infiltrate. Patient was recently admitted to the hospital on May 8 for COPD exacerbation and was discharged with prednisone and antibiotics. Patient was also recently admitted for GI bleed and at that time her aspirin and Plavix were discontinued.   # Acute and chronic respiratory failure with hypoxia, dyspnea secondary to healthcare associated pneumonia and AECOPD Sputum culture is not acceptable for testing, MRSA by PCR is negative Discontinue intravenous antibiotics, start Augmentin orally, give one dose of Lasix intravenously, follow ins and outs closely . Continue breathing treatments as needed basis. Get echocardiogram done during this admission to rule out cardiomyopathy, patient had echo in 2015, ejection fraction was found to be normal at that time, mitral valve was found to be thickened with normal leaflet mobility  #Hematochezia probably due to aspirin and Plavix, now off these medications, discussed with both GI and cardiology Dr. Lady Gary few days ago . GI is not considering any procedures. Patient's diet to be  degraded to clear liquids, continue Protonix intravenously.  Monitoring hemoglobin and crit closely and transfuse as needed, patient was transfused 1 packed red blood cell unit on admission, recheck hemoglobin level later today and transfuse as needed  #History of coronary artery disease status post stents in 2015 Off aspirin and Plavix in view of current GI bleed Cardiology consult is placed and discussed with Dr.fath, recommended to discontinue both aspirin and Plavix at this time and outpatient follow-up  #COPD with  mild exacerbation on admission, improved on prednisone, discontinue prednisone due to gastrointestinal bleed, continue her nebs as needed. Continue Augmentin  #Acute on chronic diastolic congestive heart failure with bilateral pleural effusions, give 1. Dose of Lasix Intravenously,  continue oral Lasix, patient is net negative of 2.6 L since admission , follow ins and outs, oxygenation. Follow blood pressure readings also very close as patient's blood pressure is marginal.   # SVT with rate of 160, resume metoprolol  #Hypothyroidism continue Synthroid   Provide GI and DVT prophylaxis with SCDs    All the records are reviewed and case discussed with Care Management/Social Workerr. Management plans discussed with the patient, family and they are in agreement.  CODE STATUS:   TOTAL Critical CARE TIME TAKING CARE OF THIS PATIENT: 40 minutes.   POSSIBLE D/C IN 2  DAYS, DEPENDING ON CLINICAL CONDITION.   Katharina Caper M.D on 06/08/2015 at 3:55 PM  Between 7am to 6pm - Pager - [phone] After  6pm go to [email or web address] - password [email or web address] [email or web address]  [facility]  Office  [phone]  CC: Primary care physician; Lorie PhenixNancy Maloney, MD

## 2015-06-09 ENCOUNTER — Inpatient Hospital Stay
Admit: 2015-06-09 | Discharge: 2015-06-09 | Disposition: A | Discharge status: Home / Self Care | Payer: Medicare Other | Attending: Internal Medicine | Admitting: Internal Medicine

## 2015-06-09 LAB — COMPREHENSIVE METABOLIC PANEL
ALBUMIN: 3.5 g/dL (ref 3.5–5.0)
ALK PHOS: 60 U/L (ref 38–126)
ALT: 13 U/L — AB (ref 14–54)
AST: 25 U/L (ref 15–41)
Anion gap: 9 (ref 5–15)
BILIRUBIN TOTAL: 0.5 mg/dL (ref 0.3–1.2)
BUN: 17 mg/dL (ref 6–20)
CALCIUM: 8.6 mg/dL — AB (ref 8.9–10.3)
CO2: 30 mmol/L (ref 22–32)
Chloride: 100 mmol/L — ABNORMAL LOW (ref 101–111)
Creatinine, Ser: 0.89 mg/dL (ref 0.44–1.00)
GFR calc Af Amer: >60 mL/min (ref >60)
GFR calc non Af Amer: >60 mL/min (ref >60)
GLUCOSE: 88 mg/dL (ref 65–99)
Potassium: 3.3 mmol/L — ABNORMAL LOW (ref 3.5–5.1)
SODIUM: 139 mmol/L (ref 135–145)
TOTAL PROTEIN: 6.4 g/dL — AB (ref 6.5–8.1)

## 2015-06-09 LAB — CBC
HEMATOCRIT: 26.4 % — AB (ref 35.0–47.0)
HEMOGLOBIN: 8.5 g/dL — AB (ref 12.0–16.0)
MCH: 26.8 pg (ref 26.0–34.0)
MCHC: 32.1 g/dL (ref 32.0–36.0)
MCV: 83.4 fL (ref 80.0–100.0)
Platelets: 287 10*3/uL (ref 150–440)
RBC: 3.17 MIL/uL — ABNORMAL LOW (ref 3.80–5.20)
RDW: 15.3 % — AB (ref 11.5–14.5)
WBC: 8.6 10*3/uL (ref 3.6–11.0)

## 2015-06-09 LAB — BASIC METABOLIC PANEL
Anion gap: 6 (ref 5–15)
BUN: 18 mg/dL (ref 6–20)
CHLORIDE: 99 mmol/L — AB (ref 101–111)
CO2: 34 mmol/L — AB (ref 22–32)
CREATININE: 0.91 mg/dL (ref 0.44–1.00)
Calcium: 8.7 mg/dL — ABNORMAL LOW (ref 8.9–10.3)
GFR calc Af Amer: >60 mL/min (ref >60)
GFR calc non Af Amer: 59 mL/min — ABNORMAL LOW (ref >60)
GLUCOSE: 94 mg/dL (ref 65–99)
POTASSIUM: 4.4 mmol/L (ref 3.5–5.1)
SODIUM: 139 mmol/L (ref 135–145)

## 2015-06-09 LAB — HEMOGLOBIN
HEMOGLOBIN: 8.7 g/dL — AB (ref 12.0–16.0)
Hemoglobin: 8.5 g/dL — ABNORMAL LOW (ref 12.0–16.0)

## 2015-06-09 MED ORDER — DOXYCYCLINE HYCLATE 100 MG PO TABS
100.0000 mg | ORAL_TABLET | Freq: Two times a day (BID) | ORAL | Status: DC
  Administered 2015-06-09 – 2015-06-13 (×9): 100 mg via ORAL
  Filled 2015-06-09 (×10): qty 1

## 2015-06-09 MED ORDER — METHYLPREDNISOLONE SODIUM SUCC 125 MG IJ SOLR
60.0000 mg | INTRAMUSCULAR | Status: DC
Start: 2015-06-09 — End: 2015-06-13
  Administered 2015-06-09 – 2015-06-12 (×4): 60 mg via INTRAVENOUS
  Filled 2015-06-09 (×4): qty 2

## 2015-06-09 MED ORDER — MAGNESIUM OXIDE 400 (241.3 MG) MG PO TABS
400.0000 mg | ORAL_TABLET | Freq: Two times a day (BID) | ORAL | Status: AC
  Administered 2015-06-09 (×2): 400 mg via ORAL
  Filled 2015-06-09 (×2): qty 1

## 2015-06-09 MED ORDER — POTASSIUM CHLORIDE 20 MEQ PO PACK
40.0000 meq | PACK | Freq: Once | ORAL | Status: AC
  Administered 2015-06-09: 40 meq via ORAL
  Filled 2015-06-09: qty 2

## 2015-06-09 NOTE — Progress Notes (Signed)
[facility] - [place] at [facility]   PATIENT NAME: Samantha Goodman    MR#:  [id number]  DATE OF BIRTH:  1935/04/24  SUBJECTIVE:  CHIEF COMPLAINT:  Patient Continues to be very uncomfortable, coughing , short of breath , however, improved from yesterday after diuresis,  no significant sputum production. Continues to have wheezing despite inhalation therapy. Repeated chest x-ray 27th of May 2017 revealed small left pleural effusion Black stools reported on 06/07/2015, hemoglobin level is 7.7, down from 8.7 yesterday. No more bleeding, black stool  Patient requested transfer to [facility], called the transfer center, unfortunately, no beds available and no anticipated this over the next couple of weeks for transfer center . Discussed this patient's daughter and explained about her transfer issues, medical problems  REVIEW OF SYSTEMS:  CONSTITUTIONAL: No fever, fatigue or weakness.  EYES: No blurred or double vision.  EARS, NOSE, AND THROAT: No tinnitus or ear pain.  RESPIRATORY: Reporting productive cough, exertional shortness of breath, wheezing , denies hemoptysis.  CARDIOVASCULAR: No chest pain, orthopnea, edema.  GASTROINTESTINAL: No nausea, vomiting, diarrhea or abdominal pain.  GENITOURINARY: No dysuria, hematuria.  ENDOCRINE: No polyuria, nocturia,  HEMATOLOGY: No anemia, easy bruising or bleeding SKIN: No rash or lesion. MUSCULOSKELETAL: No joint pain or arthritis.   NEUROLOGIC: No tingling, numbness, weakness.  PSYCHIATRY: No anxiety or depression.   DRUG ALLERGIES:   Allergies  Allergen Reactions  . Codeine Nausea And Vomiting  . Fluticasone-Salmeterol Other (See Comments)    Reaction:  Unknown   . Levofloxacin Other (See Comments)    Reaction:  Confusion and hallucinations   . Tiotropium Bromide Monohydrate Rash    VITALS:  Blood pressure 109/44, pulse 100, temperature 97.8 F (36.6 C), temperature source Oral, resp. rate 28, height 5' 5.5"  (1.664 m), weight 68.04 kg (150 lb), SpO2 96 %.  PHYSICAL EXAMINATION:  GENERAL:  80 y.o.-year-old patient lying in the bed In Mild to moderate respiratory distress.,, Patient is coughing, is dyspneic, tachypneic  EYES: Pupils equal, round, reactive to light and accommodation. No scleral icterus. Extraocular muscles intact.  HEENT: Head atraumatic, normocephalic. Oropharynx and nasopharynx clear.  NECK:  Supple, no jugular venous distention. No thyroid enlargement, no tenderness.  LUNGS: Moderate coarse bronchial breath sounds bilaterally,significant inspiratory and expiratory wheezing, bilateral,less  rales,rhonchi or crepitations . Today on auscultation.  intermittent use of accessory muscles of respiration,mostly with cough, patient becomes dramatic dyspneic when I'm talking to her  CARDIOVASCULAR: S1, S2 normal. No murmurs, rubs, or gallops.  ABDOMEN: Soft, nontender, nondistended. Bowel sounds present. No organomegaly or mass.  EXTREMITIES: No pedal edema, cyanosis, or clubbing.  NEUROLOGIC: Cranial nerves II through XII are intact. Muscle strength 5/5 in all extremities. Sensation intact. Gait not checked.  PSYCHIATRIC: The patient is alert and oriented x 3.  SKIN: No obvious rash, lesion, or ulcer.    LABORATORY PANEL:   CBC  Recent Labs Lab 06/09/15 0751  WBC 8.6  HGB 8.5*  HCT 26.4*  PLT 287   ------------------------------------------------------------------------------------------------------------------  Chemistries   Recent Labs Lab 06/08/15 1615 06/09/15 0004 06/09/15 1214  NA  --  139 139  K  --  3.3* 4.4  CL  --  100* 99*  CO2  --  30 34*  GLUCOSE  --  88 94  BUN  --  17 18  CREATININE  --  0.89 0.91  CALCIUM  --  8.6* 8.7*  MG 1.9  --   --   AST  --  25  --   ALT  --  13*  --   ALKPHOS  --  60  --   BILITOT  --  0.5  --    ------------------------------------------------------------------------------------------------------------------  Cardiac  Enzymes  Recent Labs Lab 06/05/15 0813  TROPONINI <0.03   ------------------------------------------------------------------------------------------------------------------  RADIOLOGY:  Dg Chest Port 1 View  06/08/2015  CLINICAL DATA:  Rectal bleeding, shortness of breath EXAM: PORTABLE CHEST 1 VIEW COMPARISON:  06/05/2015 FINDINGS: The lungs are hyperinflated likely secondary to COPD. There is small left pleural effusion. There is no focal consolidation. There is no pneumothorax. There is no right pleural effusion. The heart and mediastinal contours are unremarkable. The osseous structures are unremarkable. IMPRESSION: 1. Small left pleural effusion. Electronically Signed   By: Elige KoHetal  Patel   On: 06/08/2015 12:00    EKG:   Orders placed or performed during the hospital encounter of 06/05/15  . EKG 12-Lead  . EKG 12-Lead  . ED EKG  . ED EKG  . ED EKG  . ED EKG    ASSESSMENT AND PLAN:    Samantha Goodman is a 80 y.o. female with a known history of COPD, hyperlipidemia, coronary artery disease was CHF and hypothyroidism and multiple other medical problems is presenting to the ED with a chief complaint of shortness of breath or chest pain started since morning and has been progressively getting worse. Also patient has noticed bowel movement with large amount of maroon-colored blood. Patient was feeling dizzy and came into the ED. He is stool for Hemoccult was positive. Chest x-ray for possible infiltrate. Patient was recently admitted to the hospital on May 8 for COPD exacerbation and was discharged with prednisone and antibiotics. Patient was also recently admitted for GI bleed and at that time her aspirin and Plavix were discontinued.   # Acute and chronic respiratory failure with hypoxia, dyspnea secondary to healthcare associated pneumonia and AECOPD Sputum culture is not acceptable for testing, MRSA by PCR is negative, initiate doxycycline orally,. Continue oral  Lasix , following ins  and outs closely, improved with current therapy, is about 3.2 L negative since admission . Continue breathing treatments as needed basis. Get echocardiogram done during this admission to rule out cardiomyopathy, patient had echo in 2015, ejection fraction was found to be normal at that time, mitral valve was found to be thickened with normal leaflet mobility  #Hematochezia probably due to aspirin and Plavix, now off these medications, discussed with both GI and cardiology Dr. Lady GaryFath few days ago . GI is not considering any procedures.  patient's bleeding has stopped, diet to beupgraded to full liquid diet, continue Protonix intravenously.  Hemoglobin and crit remain stable, no need to transfuse , patient was transfused 1 packed red blood cell unit on admission, discussed with patient's daughter  #History of coronary artery disease status post stents in 2015 Off aspirin and Plavix in view of current GI bleed Cardiology consult is placed and discussed with Dr.fath, recommended to discontinue both aspirin and Plavix at this time and outpatient follow-up  #COPD with  exacerbation ,. Resume steroids, continue her nebs as needed. Continue doxycycline   #Acute on chronic diastolic congestive heart failure with bilateral pleural effusions, patient is diuresed with  Lasix Intravenously,  continue oral Lasix, patient is net negative of 3.2  L since admission , follow ins and outs, oxygenation. Follow blood pressure readings also very close as patient's blood pressure is marginal.   # SVT with rate of 160, resumed metoprolol at lower  dose   #Hypothyroidism continue Synthroid   Provide GI and DVT prophylaxis with SCDs    All the records are reviewed and case discussed with Care Management/Social Workerr. Management plans discussed with the patient, family and they are in agreement.  CODE STATUS:   TOTAL TIME TAKING CARE OF THIS PATIENT: 45 minutes.  Discussed this patient's daughter, attempted to  transfer patient to [facility], transfer was refused due to backlog for couple weeks, reported back to nursing staff  POSSIBLE D/C IN 2  DAYS, DEPENDING ON CLINICAL CONDITION.   Katharina Caper M.D on 06/09/2015 at 3:16 PM  Between 7am to 6pm - Pager - [phone] After 6pm go to [email or web address] - password [email or web address] [facility]  [facility]  Office  [phone]  CC: Primary care physician; Lorie Phenix, MD

## 2015-06-09 NOTE — Progress Notes (Signed)
*  PRELIMINARY RESULTS* Echocardiogram 2D Echocardiogram has been performed.  Georgann HousekeeperJerry R Hege 06/09/2015, 12:00 PM

## 2015-06-10 DIAGNOSIS — J209 Acute bronchitis, unspecified: Secondary | ICD-10-CM

## 2015-06-10 DIAGNOSIS — J9620 Acute and chronic respiratory failure, unspecified whether with hypoxia or hypercapnia: Secondary | ICD-10-CM

## 2015-06-10 DIAGNOSIS — I5033 Acute on chronic diastolic (congestive) heart failure: Secondary | ICD-10-CM

## 2015-06-10 DIAGNOSIS — I471 Supraventricular tachycardia: Secondary | ICD-10-CM

## 2015-06-10 LAB — ECHOCARDIOGRAM COMPLETE
HEIGHTINCHES: 65.5 in
Weight: 2400 oz

## 2015-06-10 MED ORDER — DOXYCYCLINE HYCLATE 100 MG PO TABS: 100.0000 mg | ORAL_TABLET | Freq: Two times a day (BID) | ORAL | Status: DC

## 2015-06-10 MED ORDER — POTASSIUM CHLORIDE CRYS ER 20 MEQ PO TBCR: 20.0000 meq | EXTENDED_RELEASE_TABLET | Freq: Every day | ORAL | Status: AC

## 2015-06-10 NOTE — Care Management Important Message (Signed)
Important Message  Patient Details  Name: Rhea PinkMary L Cavendish MRN: [id number] Date of Birth: 09/24/1935   Medicare Important Message Given:  Yes    Olegario MessierKathy A Silus Lanzo 06/10/2015, 1:42 PM

## 2015-06-10 NOTE — Care Management (Signed)
Barrier to discharge- Hemoglobin dropped.  GI consult.  IV Lasix . IV steroids

## 2015-06-10 NOTE — Care Management (Signed)
For discharge home.  Notified [facility].  Obtained order for nurse to draw h/h in one week.  Patient in agreement with discharge.  FAmily to transport home

## 2015-06-10 NOTE — Consult Note (Signed)
Patient breathing better, some cough still.  Hgb stable, no further bleeding.  Hgb 8.7.  Comfortable semi sitting position. No new suggestions.

## 2015-06-10 NOTE — Progress Notes (Signed)
Patient ambulated in hallway. Tolerated ambulation well with 2L O2 and stayed above 90=2%, however pt heart rate jumped to 121. Pt average heart rate has been in the upper 80's while resting. Notified Dr. Winona LegatoVaickute about findings. Pt resting in bed.

## 2015-06-10 NOTE — Progress Notes (Signed)
[facility] Physicians - [place] at [facility]   PATIENT NAME: Samantha Goodman    MR#:  [id number]  DATE OF BIRTH:  [birth year]  SUBJECTIVE:  CHIEF COMPLAINT:  Patient feels some better today, less coughing , shortness of breath ,  no significant sputum production. Less wheezing after reinitiation of steroids.  Repeated chest x-ray 27th of May 2017 revealed small left pleural effusion Black stools reported on 06/07/2015, hemoglobin 8.7 today. No more bleeding, black stool  Patient requested transfer to [facility], called the transfer center yesterday, unfortunately, no beds available and none anticipated over the next couple of weeks / Patient wants to go home. Still on 3 liters O2 in am, weaned to 2 , but HR jumped to 120 on exertion REVIEW OF SYSTEMS:  CONSTITUTIONAL: No fever, fatigue or weakness.  EYES: No blurred or double vision.  EARS, NOSE, AND THROAT: No tinnitus or ear pain.  RESPIRATORY: Reporting productive cough, exertional shortness of breath, wheezing , denies hemoptysis.  CARDIOVASCULAR: No chest pain, orthopnea, edema.  GASTROINTESTINAL: No nausea, vomiting, diarrhea or abdominal pain.  GENITOURINARY: No dysuria, hematuria.  ENDOCRINE: No polyuria, nocturia,  HEMATOLOGY: No anemia, easy bruising or bleeding SKIN: No rash or lesion. MUSCULOSKELETAL: No joint pain or arthritis.   NEUROLOGIC: No tingling, numbness, weakness.  PSYCHIATRY: No anxiety or depression.   DRUG ALLERGIES:   Allergies  Allergen Reactions  . Codeine Nausea And Vomiting  . Fluticasone-Salmeterol Other (See Comments)    Reaction:  Unknown   . Levofloxacin Other (See Comments)    Reaction:  Confusion and hallucinations   . Tiotropium Bromide Monohydrate Rash    VITALS:  Blood pressure 93/72, pulse 121, temperature 97.5 F (36.4 C), temperature source Oral, resp. rate 24, height 5' 5.5" (1.664 m), weight 68.04 kg (150 lb), SpO2 95 %.  PHYSICAL EXAMINATION:  GENERAL:  80  y.o.-year-old patient lying in the bed In Mild to moderate respiratory distress.,, Patient is coughing, is dyspneic, tachypneic  EYES: Pupils equal, round, reactive to light and accommodation. No scleral icterus. Extraocular muscles intact.  HEENT: Head atraumatic, normocephalic. Oropharynx and nasopharynx clear.  NECK:  Supple, no jugular venous distention. No thyroid enlargement, no tenderness.  LUNGS: Moderate coarse bronchial breath sounds bilaterally,less wheezing, few ,rhonchi , no crepitations . CARDIOVASCULAR: S1, S2 normal. No murmurs, rubs, or gallops.  ABDOMEN: Soft, nontender, nondistended. Bowel sounds present. No organomegaly or mass.  EXTREMITIES: No pedal edema, cyanosis, or clubbing.  NEUROLOGIC: Cranial nerves II through XII are intact. Muscle strength 5/5 in all extremities. Sensation intact. Gait not checked.  PSYCHIATRIC: The patient is alert and oriented x 3.  SKIN: No obvious rash, lesion, or ulcer.    LABORATORY PANEL:   CBC  Recent Labs Lab 06/09/15 0751 06/09/15 1608  WBC 8.6  --   HGB 8.5* 8.7*  HCT 26.4*  --   PLT 287  --    ------------------------------------------------------------------------------------------------------------------  Chemistries   Recent Labs Lab 06/08/15 1615 06/09/15 0004 06/09/15 1214  NA  --  139 139  K  --  3.3* 4.4  CL  --  100* 99*  CO2  --  30 34*  GLUCOSE  --  88 94  BUN  --  17 18  CREATININE  --  0.89 0.91  CALCIUM  --  8.6* 8.7*  MG 1.9  --   --   AST  --  25  --   ALT  --  13*  --   ALKPHOS  --  60  --   BILITOT  --  0.5  --    ------------------------------------------------------------------------------------------------------------------  Cardiac Enzymes  Recent Labs Lab 06/05/15 0813  TROPONINI <0.03   ------------------------------------------------------------------------------------------------------------------  RADIOLOGY:  No results found.  EKG:   Orders placed or performed during  the hospital encounter of 06/05/15  . EKG 12-Lead  . EKG 12-Lead  . ED EKG  . ED EKG  . ED EKG  . ED EKG    ASSESSMENT AND PLAN:    Samantha Goodman is a 80 y.o. female with a known history of COPD, hyperlipidemia, coronary artery disease was CHF and hypothyroidism and multiple other medical problems is presenting to the ED with a chief complaint of shortness of breath or chest pain started since morning and has been progressively getting worse. Also patient has noticed bowel movement with large amount of maroon-colored blood. Patient was feeling dizzy and came into the ED. He is stool for Hemoccult was positive. Chest x-ray for possible infiltrate. Patient was recently admitted to the hospital on May 8 for COPD exacerbation and was discharged with prednisone and antibiotics. Patient was also recently admitted for GI bleed and at that time her aspirin and Plavix were discontinued.   # Acute and chronic respiratory failure with hypoxia, dyspnea secondary to healthcare associated pneumonia and AECOPD Sputum cultures were  not acceptable for testing, MRSA by PCR is negative, initiated doxycycline orally, steroids IV, improved. Hold oral  Lasix , following ins and outs closely, improved with current therapy, is about 3.5 L negative since admission . Continue breathing treatments as needed basis. Echocardiogram done during this admission , same as one in 2015 revealed normal ejection fraction , diastolic dysfunction  #Hematochezia due to aspirin and Plavix, now off these medications, discussed with both GI and cardiology Dr. Lady Gary few days ago . GI is not considering any procedures.  Patient's bleeding has stopped, diet  was upgraded to soft diet, continue Protonix intravenously.  Hemoglobin and crit remain stable, no need to transfuse , patient was transfused 1 packed red blood cell unit on admission, discussed with patient's daughter yesterday  #History of coronary artery disease status post stents in  2015 Off aspirin and Plavix in view of current GI bleed Cardiology consult is placed and discussed with Dr.fath, recommended to discontinue both aspirin and Plavix at this time and outpatient follow-up, echo with normal ejection fraction , diastolic dysfunction  #COPD with  exacerbation ,. Resumed IV steroids, continue nebs as needed. Continue doxycycline, improved  #Acute on chronic diastolic congestive heart failure with bilateral pleural effusions, patient is diuresed with  Lasix Intravenously,  Hold oral Lasix, as patient is net negative of 3.5  L since admission , follow ins and outs, oxygenation. Follow blood pressure readings also very close as patient's blood pressure is marginal.   # SVT with rate of 160, resumed metoprolol at lower dose   #Hypothyroidism continue Synthroid   Provide GI and DVT prophylaxis with SCDs    All the records are reviewed and case discussed with Care Management/Social Workerr. Management plans discussed with the patient, family and they are in agreement.  CODE STATUS:   TOTAL TIME TAKING CARE OF THIS PATIENT: 35 minutes.   POSSIBLE D/C IN 2  DAYS, DEPENDING ON CLINICAL CONDITION.   Katharina Caper M.D on 06/10/2015 at 7:55 PM  Between 7am to 6pm - Pager - [phone] After 6pm go to [email or web address] - password [email or web address] [facility]  [facility]  Office  [phone]  CC: Primary care  physician; Lorie PhenixNancy Maloney, MD

## 2015-06-10 NOTE — Consult Note (Signed)
GI Inpatient Follow-up Note  Patient Identification: Samantha Goodman is a 80 y.o. female with a history of COPD, asthma, unstable angina, CHF, CAD (on Plavix), and LGIB in 04/2015 admitted with lower GI bleed.  Patient was recently admitted with LGIB in April with negative bleeding scan.  She was advised to d/c ASA 325mg  daily and resume Plavix per Dr. Lady GaryFath.  After resuming on 05/09/15, patient remained in her usual state of health until she experienced a large volume of BRBPR on 06/05/15.  Associated symptoms included weakness and dizziness.  Hgb decreased to 7.6 (previously 9.5 on 05/21/15).  During admission, Hgb fluctuated between 7.0 and 8.8.  Hgb has stabilized at 8.5 x 2 today, also 8.8 yesterday.  Due to patient's significant cardiac and respiratory issues, she is not an ideal candidate for colonoscopy.  Subjective: Patient sitting up comfortably in bed, eating full solid diet today.  She received clear liquids for more than 48 hours and tolerated diet progression well.  Per patient, she has not had a BM in at least 2 days.  She has not passed any further blood per rectum.  Dizziness and weakness improved with ambulation, though still reports some baseline SOB.  No additional GI complaints today.  Patient does state she [facility]   Scheduled Inpatient Medications:  . sodium chloride   Intravenous Once  . atorvastatin  40 mg Oral QHS  . docusate sodium  200 mg Oral Daily  . doxycycline  100 mg Oral Q12H  . guaiFENesin  600 mg Oral BID  . ipratropium-albuterol  3 mL Nebulization Q6H  . levothyroxine  88 mcg Oral QAC breakfast  . methylPREDNISolone (SOLU-MEDROL) injection  60 mg Intravenous Q24H  . metoprolol tartrate  12.5 mg Oral BID  . mometasone-formoterol  2 puff Inhalation BID  . montelukast  10 mg Oral QHS  . pantoprazole (PROTONIX) IV  40 mg Intravenous Q12H  . potassium chloride  20 mEq Oral Daily  . sodium chloride flush  3 mL Intravenous Q12H  .  theophylline  200 mg Oral Daily    Continuous Inpatient Infusions:     PRN Inpatient Medications:  acetaminophen **OR** acetaminophen, albuterol, fluticasone, ondansetron **OR** ondansetron (ZOFRAN) IV, zolpidem  Review of Systems: Constitutional: Weight is stable.  Eyes: No changes in vision. ENT: No oral lesions, sore throat.  GI: see HPI.  Heme/Lymph: No easy bruising.  CV: No chest pain.  GU: No hematuria.  Integumentary: No rashes.  Neuro: No headaches.  Psych: No depression/anxiety.  Endocrine: No heat/cold intolerance.  Allergic/Immunologic: No urticaria.  Resp: No cough, SOB.  Musculoskeletal: No joint swelling.    Physical Examination: BP 93/72 mmHg  Pulse 81  Temp(Src) 97.5 F (36.4 C) (Oral)  Resp 24  Ht 5' 5.5" (1.664 m)  Wt 68.04 kg (150 lb)  BMI 24.57 kg/m2  SpO2 97% Gen: NAD, alert and oriented x 4 HEENT: PEERLA, EOMI, Neck: supple, no JVD or thyromegaly Chest: CTA bilaterally, no wheezes, crackles, or other adventitious sounds CV: RRR, no m/g/c/r Abd: soft, NT, ND, +BS in all four quadrants; no HSM, guarding, ridigity, or rebound tenderness Ext: no edema, well perfused with 1+ pulses, Skin: no rash or lesions noted Lymph: no LAD  Data: Lab Results  Component Value Date   WBC 8.6 06/09/2015   HGB 8.7* 06/09/2015   HCT 26.4* 06/09/2015   MCV 83.4 06/09/2015   PLT 287 06/09/2015    Recent Labs Lab 06/09/15 0004 06/09/15 0751  06/09/15 1608  HGB 8.5* 8.5* 8.7*   Lab Results  Component Value Date   NA 139 06/09/2015   K 4.4 06/09/2015   CL 99* 06/09/2015   CO2 34* 06/09/2015   BUN 18 06/09/2015   CREATININE 0.91 06/09/2015   GLU 82 04/04/2014   Lab Results  Component Value Date   ALT 13* 06/09/2015   AST 25 06/09/2015   ALKPHOS 60 06/09/2015   BILITOT 0.5 06/09/2015    Recent Labs Lab 06/06/15 0202  INR 1.04   Assessment/Plan: Ms. Areola is a 80 y.o. female history of COPD, asthma, unstable angina, CHF, CAD (on Plavix),  and LGIB in 04/2015 admitted with lower GI bleeding.  Hgb dropped to 7.6 from 9.5 3 weeks prior.  During previous admission, patient was advised to restart Plavix per Dr. Lady Gary (resumed on 4/27), but she self-resumed ASA  as well.  Hgb now stable at 8.5.  Patient is improving symptomatically and tolerating a full diet today.  Agree that her patient's respiratory and cardiac issues place her at higher risk when undergoing procedures, so patient would be benefited by continuing care at a tertiary center upon discharge - if she wishes.  At this time, recommend continuing to monitor Hgb and BMs.  Further recs pending results.  Recommendations: - Monitor Hgb, transfuse if <7 - Further recs pending above  Patient has been discussed with Dr. Mechele Collin, pending further GI recommendations at this time. Please call with questions or concerns.  Burman Freestone, PA-C [facility] Phone: [phone] Pager: [phone]

## 2015-06-11 LAB — HEMOGLOBIN AND HEMATOCRIT, BLOOD
HCT: 24.4 % — ABNORMAL LOW (ref 35.0–47.0)
Hemoglobin: 8 g/dL — ABNORMAL LOW (ref 12.0–16.0)

## 2015-06-11 LAB — HEMOGLOBIN: Hemoglobin: 8.1 g/dL — ABNORMAL LOW (ref 12.0–16.0)

## 2015-06-11 MED ORDER — IPRATROPIUM-ALBUTEROL 0.5-2.5 (3) MG/3ML IN SOLN
3.0000 mL | RESPIRATORY_TRACT | Status: DC
  Administered 2015-06-11 – 2015-06-13 (×11): 3 mL via RESPIRATORY_TRACT
  Filled 2015-06-11 (×6): qty 3

## 2015-06-11 NOTE — Consult Note (Signed)
Patient has signif wheezing on chest exam.  She is tired of hospital and wants to go home.  Hgb 8.0, reports stools dark.  Likely bled on Plavix per Dr. Bluford Kaufmannh note.  The effect of Plavix duration is about 7 days until new platelets are made that are not compromised by the Plavix.  She lives alone, family are within easy driving distance.  Would follow hgb, transfuse if below 7.5.  Due to cardiopulmonary status no invasive studies recommended at this time.

## 2015-06-11 NOTE — Progress Notes (Signed)
[facility] Physicians - [place] at [facility]   PATIENT NAME: Samantha Goodman    MR#:  [id number]  DATE OF BIRTH:  [birth year]  SUBJECTIVE:  CHIEF COMPLAINT:  Patient feels some better today, less coughing , shortness of breath ,  no significant sputum production. Less wheezing after reinitiation of steroids.  Repeated chest x-ray 27th of May 2017 revealed small left pleural effusion Black stools reported on 06/07/2015, And yesterday, 06/10/2015, patient admits of dark stool today, formed, hemoglobin 8.0 today. Patient is hypotensive today. Lasix has been stopped. Patient was not discharged yesterday due to significant tachycardia on exertion, heart rate going up to about 120 while patient was walking. Patient was explained that we will not be able to discharge her today as well due to black looking stool and concerns of ongoing bleeding. Patient requested transfer to [facility], called the transfer center yesterday, unfortunately, no beds available and none anticipated over the next couple of weeks / Patient wants to go home. Still on 3 liters O2 in am, weaned to 2 , but HR jumped to 120 on exertion REVIEW OF SYSTEMS:  CONSTITUTIONAL: No fever, fatigue or weakness.  EYES: No blurred or double vision.  EARS, NOSE, AND THROAT: No tinnitus or ear pain.  RESPIRATORY: Reporting productive cough, exertional shortness of breath, wheezing , denies hemoptysis.  CARDIOVASCULAR: No chest pain, orthopnea, edema.  GASTROINTESTINAL: No nausea, vomiting, diarrhea or abdominal pain.  GENITOURINARY: No dysuria, hematuria.  ENDOCRINE: No polyuria, nocturia,  HEMATOLOGY: No anemia, easy bruising or bleeding SKIN: No rash or lesion. MUSCULOSKELETAL: No joint pain or arthritis.   NEUROLOGIC: No tingling, numbness, weakness.  PSYCHIATRY: No anxiety or depression.   DRUG ALLERGIES:   Allergies  Allergen Reactions  . Codeine Nausea And Vomiting  . Fluticasone-Salmeterol Other (See Comments)   Reaction:  Unknown   . Levofloxacin Other (See Comments)    Reaction:  Confusion and hallucinations   . Tiotropium Bromide Monohydrate Rash    VITALS:  Blood pressure 118/57, pulse 88, temperature 98.1 F (36.7 C), temperature source Oral, resp. rate 20, height 5' 5.5" (1.664 m), weight 68.04 kg (150 lb), SpO2 100 %.  PHYSICAL EXAMINATION:  GENERAL:  80 y.o.-year-old patient lying in the bed In No respiratory distress., Comfortable, smiling  EYES: Pupils equal, round, reactive to light and accommodation. No scleral icterus. Extraocular muscles intact.  HEENT: Head atraumatic, normocephalic. Oropharynx and nasopharynx clear.  NECK:  Supple, no jugular venous distention. No thyroid enlargement, no tenderness.  LUNGS: Moderate coarse bronchial breath sounds bilaterally,no wheezing, few ,rhonchi , no crepitations . CARDIOVASCULAR: S1, S2 normal. No murmurs, rubs, or gallops.  ABDOMEN: Soft, nontender, nondistended. Bowel sounds present. No organomegaly or mass. Very dark brown/black looking stool EXTREMITIES: No pedal edema, cyanosis, or clubbing.  NEUROLOGIC: Cranial nerves II through XII are intact. Muscle strength 5/5 in all extremities. Sensation intact. Gait not checked.  PSYCHIATRIC: The patient is alert and oriented x 3.  SKIN: No obvious rash, lesion, or ulcer.    LABORATORY PANEL:   CBC  Recent Labs Lab 06/09/15 0751  06/11/15 1139  WBC 8.6  --   --   HGB 8.5*  < > 8.0*  HCT 26.4*  --  24.4*  PLT 287  --   --   < > = values in this interval not displayed. ------------------------------------------------------------------------------------------------------------------  Chemistries   Recent Labs Lab 06/08/15 1615 06/09/15 0004 06/09/15 1214  NA  --  139 139  K  --  3.3*  4.4  CL  --  100* 99*  CO2  --  30 34*  GLUCOSE  --  88 94  BUN  --  17 18  CREATININE  --  0.89 0.91  CALCIUM  --  8.6* 8.7*  MG 1.9  --   --   AST  --  25  --   ALT  --  13*  --   ALKPHOS   --  60  --   BILITOT  --  0.5  --    ------------------------------------------------------------------------------------------------------------------  Cardiac Enzymes  Recent Labs Lab 06/05/15 0813  TROPONINI <0.03   ------------------------------------------------------------------------------------------------------------------  RADIOLOGY:  No results found.  EKG:   Orders placed or performed during the hospital encounter of 06/05/15  . EKG 12-Lead  . EKG 12-Lead  . ED EKG  . ED EKG  . ED EKG  . ED EKG    ASSESSMENT AND PLAN:    Tynesha Free is a 80 y.o. female with a known history of COPD, hyperlipidemia, coronary artery disease was CHF and hypothyroidism and multiple other medical problems is presenting to the ED with a chief complaint of shortness of breath or chest pain started since morning and has been progressively getting worse. Also patient has noticed bowel movement with large amount of maroon-colored blood. Patient was feeling dizzy and came into the ED. He is stool for Hemoccult was positive. Chest x-ray for possible infiltrate. Patient was recently admitted to the hospital on May 8 for COPD exacerbation and was discharged with prednisone and antibiotics. Patient was also recently admitted for GI bleed and at that time her aspirin and Plavix were discontinued.   # Acute and chronic respiratory failure with hypoxia, dyspnea secondary to healthcare associated pneumonia and AECOPD Sputum cultures were  not acceptable for testing, MRSA by PCR is negative, Continue doxycycline orally, steroids IV, improved. Holding oral  Lasix , following ins and outs closely, improved with current therapy, is about 3.6 L negative since admission . Continue breathing treatments on as needed basis. Echocardiogram done during this admission , same as one in 2015 revealed normal ejection fraction , diastolic dysfunction  #Hematochezia due to aspirin and Plavix, now off these medications,  discussed with both GI and cardiology Dr. Lady Gary few days ago . GI is not considering any procedures.  The patient has ongoing black stool, hemoglobin level is dropping again, downgrade diet  to full liquids diet, continue Protonix intravenously.  Follow Hemoglobin every 8 hours ,  transfuse as needed , patient was transfused 1 packed red blood cell unit on admission  #History of coronary artery disease status post stents in 2015 Off aspirin and Plavix in view of current GI bleed Cardiology consult is placed and discussed with Dr.fath, recommended to discontinue both aspirin and Plavix at this time and outpatient follow-up, echo with normal ejection fraction , diastolic dysfunction  #COPD with  exacerbation ,. Resumed IV steroids, continue nebs as needed. Continue doxycycline, improved  #Acute on chronic diastolic congestive heart failure with bilateral pleural effusions, patient is diuresed with  Lasix Intravenously,  Hold oral Lasix, as patient is net negative of 3.6  L since admission , follow ins and outs, oxygenation. Follow blood pressure readings also very close as patient's blood pressure is marginal.   # SVT with rate of 160, resumed metoprolol at lower dose , holding parameters are ordered  #Hypothyroidism continue Synthroid   Provide GI and DVT prophylaxis with SCDs    All the records are reviewed and  case discussed with Care Management/Social Workerr. Management plans discussed with the patient, family and they are in agreement.  CODE STATUS:   TOTAL TIME TAKING CARE OF THIS PATIENT: 45 minutes.      Katharina CaperVAICKUTE,Andrya Roppolo M.D on 06/11/2015 at 5:30 PM  Between 7am to 6pm - Pager - [phone] After 6pm go to [email or web address] - password [email or web address] [facility]  [facility] [place]  Office  [phone]  CC: Primary care physician; Lorie PhenixNancy Maloney, MD

## 2015-06-11 NOTE — Care Management (Signed)
Patient was to have discharge last pm but found this morning the order was cancelled.  Nursing staff does not know why discharge did not occur.  patient stated she does not know why she did not leave.  Have paged attending  to discuss and to inquire about possible need for h/h prior to dc today?

## 2015-06-11 NOTE — Progress Notes (Signed)
Dr Winona LegatoVaickute notified of low BP through the night and this AM and held lopressor.  Pt encouraged to increase po fluid intake.

## 2015-06-12 LAB — BASIC METABOLIC PANEL
Anion gap: 6 (ref 5–15)
BUN: 23 mg/dL — AB (ref 6–20)
CHLORIDE: 102 mmol/L (ref 101–111)
CO2: 33 mmol/L — ABNORMAL HIGH (ref 22–32)
Calcium: 8.8 mg/dL — ABNORMAL LOW (ref 8.9–10.3)
Creatinine, Ser: 0.82 mg/dL (ref 0.44–1.00)
GFR calc Af Amer: >60 mL/min (ref >60)
GFR calc non Af Amer: >60 mL/min (ref >60)
Glucose, Bld: 84 mg/dL (ref 65–99)
POTASSIUM: 3.7 mmol/L (ref 3.5–5.1)
SODIUM: 141 mmol/L (ref 135–145)

## 2015-06-12 LAB — HEMOGLOBIN
HEMOGLOBIN: 8.2 g/dL — AB (ref 12.0–16.0)
Hemoglobin: 7.5 g/dL — ABNORMAL LOW (ref 12.0–16.0)
Hemoglobin: 8.2 g/dL — ABNORMAL LOW (ref 12.0–16.0)

## 2015-06-12 MED ORDER — FUROSEMIDE 20 MG PO TABS
20.0000 mg | ORAL_TABLET | Freq: Every day | ORAL | Status: DC
  Administered 2015-06-12 – 2015-06-13 (×2): 20 mg via ORAL
  Filled 2015-06-12 (×2): qty 1

## 2015-06-12 MED ORDER — FLUTICASONE PROPIONATE 50 MCG/ACT NA SUSP
2.0000 | Freq: Every day | NASAL | Status: DC | PRN
  Administered 2015-06-12: 2 via NASAL
  Filled 2015-06-12 (×2): qty 16

## 2015-06-12 MED ORDER — SODIUM CHLORIDE 0.9 % IV SOLN: Freq: Once | INTRAVENOUS | Status: DC

## 2015-06-12 MED ORDER — POTASSIUM CHLORIDE CRYS ER 20 MEQ PO TBCR
20.0000 meq | EXTENDED_RELEASE_TABLET | Freq: Every day | ORAL | Status: DC
  Administered 2015-06-12 – 2015-06-13 (×2): 20 meq via ORAL
  Filled 2015-06-12 (×2): qty 1

## 2015-06-12 NOTE — Progress Notes (Signed)
[facility] - [place] at [facility]   PATIENT NAME: Samantha Goodman    MR#:  [id number]  DATE OF BIRTH:  25-Jun-1935  SUBJECTIVE:  CHIEF COMPLAINT:  Patient feels some better today, less coughing , shortness of breath ,  no significant sputum production. Less wheezing after reinitiation of steroids.  Repeated chest x-ray 27th of May 2017 revealed small left pleural effusion Black stools reported on 06/07/2015, And  06/10/2015, Resolved now. Hemoglobin level was 7.5 yesterday, transfuse 1 unit of packed red blood cells, hemoglobin level is 8.2 today, patient complains of being hungry, requests food. Patient was seen by gastroenterologist, advancement of diet was recommended, and follow for recurrent bleeding, gastric neurologist feels that gastrointestinal bleeding could recur for about a week after Plavix is stopped for longer  Prolonged discussion today with family for about 30 minutes about patient's mental condition, declining over the period of time, worsening dementia, chronic behavior problems. Patient's family were advised to seek guardianship, however they were advised that patient is competent to make decisions for medical care at this time. Patient's daughter voiced resume understanding REVIEW OF SYSTEMS:  CONSTITUTIONAL: No fever, fatigue or weakness.  EYES: No blurred or double vision.  EARS, NOSE, AND THROAT: No tinnitus or ear pain.  RESPIRATORY: Reporting productive cough, exertional shortness of breath, wheezing , denies hemoptysis.  CARDIOVASCULAR: No chest pain, orthopnea, edema.  GASTROINTESTINAL: No nausea, vomiting, diarrhea or abdominal pain.  GENITOURINARY: No dysuria, hematuria.  ENDOCRINE: No polyuria, nocturia,  HEMATOLOGY: No anemia, easy bruising or bleeding SKIN: No rash or lesion. MUSCULOSKELETAL: No joint pain or arthritis.   NEUROLOGIC: No tingling, numbness, weakness.  PSYCHIATRY: No anxiety or depression.   DRUG ALLERGIES:   Allergies   Allergen Reactions  . Codeine Nausea And Vomiting  . Fluticasone-Salmeterol Other (See Comments)    Reaction:  Unknown   . Levofloxacin Other (See Comments)    Reaction:  Confusion and hallucinations   . Tiotropium Bromide Monohydrate Rash    VITALS:  Blood pressure 110/49, pulse 75, temperature 97.7 F (36.5 C), temperature source Oral, resp. rate 19, height 5' 5.5" (1.664 m), weight 68.04 kg (150 lb), SpO2 95 %.  PHYSICAL EXAMINATION:  GENERAL:  80 y.o.-year-old patient lying in the bed In No respiratory distress., Comfortable, smiling  EYES: Pupils equal, round, reactive to light and accommodation. No scleral icterus. Extraocular muscles intact.  HEENT: Head atraumatic, normocephalic. Oropharynx and nasopharynx clear.  NECK:  Supple, no jugular venous distention. No thyroid enlargement, no tenderness.  LUNGS: Moderate coarse bronchial breath sounds bilaterally,no wheezing, few ,rhonchi , no crepitations . CARDIOVASCULAR: S1, S2 normal. No murmurs, rubs, or gallops.  ABDOMEN: Soft, nontender, nondistended. Bowel sounds present. No organomegaly or mass. Very dark brown/black looking stool EXTREMITIES: No pedal edema, cyanosis, or clubbing.  NEUROLOGIC: Cranial nerves II through XII are intact. Muscle strength 5/5 in all extremities. Sensation intact. Gait not checked.  PSYCHIATRIC: The patient is alert and oriented x 3.  SKIN: No obvious rash, lesion, or ulcer.    LABORATORY PANEL:   CBC  Recent Labs Lab 06/09/15 0751  06/11/15 1139  06/12/15 0907  WBC 8.6  --   --   --   --   HGB 8.5*  < > 8.0*  < > 8.2*  HCT 26.4*  --  24.4*  --   --   PLT 287  --   --   --   --   < > = values in this interval not  displayed. ------------------------------------------------------------------------------------------------------------------  Chemistries   Recent Labs Lab 06/08/15 1615 06/09/15 0004  06/12/15 0134  NA  --  139  < > 141  K  --  3.3*  < > 3.7  CL  --  100*  < > 102   CO2  --  30  < > 33*  GLUCOSE  --  88  < > 84  BUN  --  17  < > 23*  CREATININE  --  0.89  < > 0.82  CALCIUM  --  8.6*  < > 8.8*  MG 1.9  --   --   --   AST  --  25  --   --   ALT  --  13*  --   --   ALKPHOS  --  60  --   --   BILITOT  --  0.5  --   --   < > = values in this interval not displayed. ------------------------------------------------------------------------------------------------------------------  Cardiac Enzymes No results for input(s): TROPONINI in the last 168 hours. ------------------------------------------------------------------------------------------------------------------  RADIOLOGY:  No results found.  EKG:   Orders placed or performed during the hospital encounter of 06/05/15  . EKG 12-Lead  . EKG 12-Lead  . ED EKG  . ED EKG  . ED EKG  . ED EKG    ASSESSMENT AND PLAN:    Samantha Goodman is a 80 y.o. female with a known history of COPD, hyperlipidemia, coronary artery disease was CHF and hypothyroidism and multiple other medical problems is presenting to the ED with a chief complaint of shortness of breath or chest pain started since morning and has been progressively getting worse. Also patient has noticed bowel movement with large amount of maroon-colored blood. Patient was feeling dizzy and came into the ED. He is stool for Hemoccult was positive. Chest x-ray for possible infiltrate. Patient was recently admitted to the hospital on May 8 for COPD exacerbation and was discharged with prednisone and antibiotics. Patient was also recently admitted for GI bleed and at that time her aspirin and Plavix were discontinued.   # Acute and chronic respiratory failure with hypoxia, dyspnea secondary to healthcare associated pneumonia and AECOPD Sputum cultures were  not acceptable for testing, MRSA by PCR is negative, Continue doxycycline orally, steroids IV, improved. Resume oral lasix , following ins and outs closely, improved with current therapy, is about  2.9 L negative since admission . Continue breathing treatments on as needed basis. Echocardiogram done during this admission , same as one in 2015 revealed normal ejection fraction , diastolic dysfunction  #Hematochezia due to aspirin and Plavix, now off these medications, discussed with both GI and cardiology Dr. Lady GaryFath few days ago . GI is not considering any procedures, even with repeated gastrointestinal bleeding .  The patient had black stool yesterday , hemoglobin level  has dropped  again,  patient was transfused 1 more units of packed red blood cells, however, requests to have grade her diet to soft diet, following hemoglobin level tomorrow morning and possibly discharge home if hemoglobin level remains stable.    # History  of coronary artery disease status post stents in 2015 Off aspirin and Plavix in view of current GI bleed Cardiology consult is placed and discussed with Dr.fath, recommended to discontinue both aspirin and Plavix at this time and outpatient follow-up, echo with normal ejection fraction , diastolic dysfunction  #COPD with  exacerbation ,. Resumed IV steroids, continue nebs as needed. Continue doxycycline, improved  #Acute on  chronic diastolic congestive heart failure with bilateral pleural effusions, patient is diuresed with  Lasix Intravenously,   resumeoral Lasix, as patient is net negative of 2.9 L since admission , follow ins and outs, oxygenation. Follow blood pressure readings also very close as patient's blood pressure is marginal.   # SVT with rate of 160, resumed metoprolol at lower dose , holding parameters are ordered  #Hypothyroidism continue Synthroid   # dementia with behavioral problems, discussed with patient's family extensively for about 25-30  minutes, all questions were answered, they voiced understanding, recommended guardianship, however, advised patient is capable to make decisions about medical care at this time, also explained that no significant  medications will be prescribed due to behavioral issues since patient is relatively independent. The patient's daughter was told to refer to patient's primary care physician and possibly right lateral to [facility] with concerns about her driving and possibly restricting her driver's license. Patient's family was understanding and appreciation   Provide GI and DVT prophylaxis with SCDs    All the records are reviewed and case discussed with Care Management/Social Workerr. Management plans discussed with the patient, family and they are in agreement.  CODE STATUS:   TOTAL TIME TAKING CARE OF THIS PATIENT: 50 minutes.    . Advance care planning =- prolonged discussion with patient's family about patient's behavior, medical noncompliance, etc., all questions were answered, patient's family voiced understanding , 30 min discussion   Delvon Chipps M.D on 06/12/2015 at 5:34 PM  Between 7am to 6pm - Pager - [phone] After 6pm go to [email or web address] - password [email or web address] [facility]  [place] [facility]  Office  [phone]  CC: Primary care physician; Lorie Phenix, MD

## 2015-06-12 NOTE — Care Management (Signed)
Barrier to discharge- black stool with drip in hgb to 7.5.  There were orders for transfusion but verbally reported that patient declined transfusion and patient herself informed CM.   Do not find documentation of this.  GI consulted and indicated that  it takes 7 days for new platelets to form.  Continue with serial hemoglobins.  Recommends transfuse for hgb 75 and below.  [facility].  Family verbalize concern that patient does not make good decisions for herself.  Patient is competent and able to make her own decisions.  Sent request for [facility]RI.  Patient placed on low residue diet

## 2015-06-12 NOTE — Consult Note (Signed)
GI Inpatient Follow-up Note  Patient Identification: Samantha Goodman is a 80 y.o. female with a history of COPD, asthma, unstable angina, CHF, CAD (on Plavix), and LGIB in 04/2015 admitted with lower GI bleed.  Patient was recently admitted with LGIB in April with negative bleeding scan.  She was advised to d/c ASA  daily and resume Plavix per Dr. Lady Gary.  After resuming on 05/09/15, patient remained in her usual state of health until she experienced a large volume of BRBPR on 06/05/15.  Associated symptoms included weakness and dizziness.  Hgb decreased to 7.6 (previously 9.5 on 05/21/15).  During admission, Hgb fluctuated between 7.0 and 8.8.  Hgb has stabilized at 8.5 x 2 today, also 8.8 yesterday.  Due to patient's significant cardiac and respiratory issues, she is not an ideal candidate for colonoscopy.  Subjective: Patient reports 1 "very dark and black" BM last night, then 2 similar BMs today.  These were her first BMs since admission.  No dizziness, lightheadedness, weakness, or CP.  SOB at baseline. Ambulating w/o difficulty.  Tolerating dietary progression well.  No new concerns today.    Scheduled Inpatient Medications:  . sodium chloride   Intravenous Once  . sodium chloride   Intravenous Once  . atorvastatin  40 mg Oral QHS  . docusate sodium  200 mg Oral Daily  . doxycycline  100 mg Oral Q12H  . guaiFENesin  600 mg Oral BID  . ipratropium-albuterol  3 mL Nebulization Q4H  . levothyroxine  88 mcg Oral QAC breakfast  . methylPREDNISolone (SOLU-MEDROL) injection  60 mg Intravenous Q24H  . metoprolol tartrate  12.5 mg Oral BID  . mometasone-formoterol  2 puff Inhalation BID  . montelukast  10 mg Oral QHS  . pantoprazole (PROTONIX) IV  40 mg Intravenous Q12H  . sodium chloride flush  3 mL Intravenous Q12H  . theophylline  200 mg Oral Daily    Continuous Inpatient Infusions:     PRN Inpatient Medications:  acetaminophen **OR** acetaminophen, albuterol, fluticasone, ondansetron  **OR** ondansetron (ZOFRAN) IV, zolpidem  Review of Systems: Constitutional: Weight is stable.  Eyes: No changes in vision. ENT: No oral lesions, sore throat.  GI: see HPI.  Heme/Lymph: No easy bruising.  CV: No chest pain.  GU: No hematuria.  Integumentary: No rashes.  Neuro: No headaches.  Psych: No depression/anxiety.  Endocrine: No heat/cold intolerance.  Allergic/Immunologic: No urticaria.  Resp: No cough, SOB.  Musculoskeletal: No joint swelling.    Physical Examination: BP 105/48 mmHg  Pulse 71  Temp(Src) 98.1 F (36.7 C) (Oral)  Resp 20  Ht 5' 5.5" (1.664 m)  Wt 68.04 kg (150 lb)  BMI 24.57 kg/m2  SpO2 92% Gen: NAD, alert and oriented x 4 HEENT: PEERLA, EOMI, Neck: supple, no JVD or thyromegaly Chest: CTA bilaterally, + significant wheezes bilaterally, no crackles, or other adventitious sounds CV: RRR, no m/g/c/r Abd: soft, NT, ND, +BS in all four quadrants; no HSM, guarding, ridigity, or rebound tenderness Ext: no edema, well perfused with 1+ pulses Skin: no rash or lesions noted Lymph: no LAD  Data: Lab Results  Component Value Date   WBC 8.6 06/09/2015   HGB 8.2* 06/12/2015   HCT 24.4* 06/11/2015   MCV 83.4 06/09/2015   PLT 287 06/09/2015    Recent Labs Lab 06/11/15 1758 06/12/15 0134 06/12/15 0907  HGB 8.1* 7.5* 8.2*   Lab Results  Component Value Date   NA 141 06/12/2015   K 3.7 06/12/2015   CL 102 06/12/2015  CO2 33* 06/12/2015   BUN 23* 06/12/2015   CREATININE 0.82 06/12/2015   GLU 82 04/04/2014   Lab Results  Component Value Date   ALT 13* 06/09/2015   AST 25 06/09/2015   ALKPHOS 60 06/09/2015   BILITOT 0.5 06/09/2015    Recent Labs Lab 06/06/15 0202  INR 1.04   Assessment/Plan: Samantha Goodman is a 80 y.o. female history of COPD, asthma, unstable angina, CHF, CAD (on Plavix), and LGIB in 04/2015 admitted with lower GI bleeding.  Hgb dropped to 7.6 from 9.5 3 weeks prior.  During previous admission, patient was advised to  restart Plavix per Dr. Lady GaryFath (resumed on 4/27), but she self-resumed ASA 325mg  as well.  Hgb now stable at previously stable around 8.5, dropped to 7.5 yesterday, back at 8.1 today.  No evidence of re-bleeding - likely normal variation of Hgb.  "Black stools" likely excretion of old blood, as these were her first BMs since admission.  She continues to be a poor endoscopy candidate due to significant cardiopulmonary issues.  No intervention indicated at this time. Will continue to monitor stools and Hgb.  Recommendations: - Monitor Hgb, transfuse if <7 - Monitor stooling - Further recs pending above  Patient has been discussed with Dr. Mechele CollinElliott, pending further GI recommendations at this time. Please call with questions or concerns.  Burman FreestoneMichelle C Johnson, PA-C [facility] Phone: [phone] Pager: [phone]

## 2015-06-13 DIAGNOSIS — Z9981 Dependence on supplemental oxygen: Secondary | ICD-10-CM | POA: Diagnosis not present

## 2015-06-13 DIAGNOSIS — R062 Wheezing: Secondary | ICD-10-CM | POA: Diagnosis not present

## 2015-06-13 DIAGNOSIS — R0602 Shortness of breath: Secondary | ICD-10-CM | POA: Diagnosis not present

## 2015-06-13 DIAGNOSIS — F0390 Unspecified dementia without behavioral disturbance: Secondary | ICD-10-CM

## 2015-06-13 DIAGNOSIS — Z955 Presence of coronary angioplasty implant and graft: Secondary | ICD-10-CM | POA: Diagnosis not present

## 2015-06-13 DIAGNOSIS — E039 Hypothyroidism, unspecified: Secondary | ICD-10-CM

## 2015-06-13 DIAGNOSIS — I771 Stricture of artery: Secondary | ICD-10-CM | POA: Diagnosis not present

## 2015-06-13 DIAGNOSIS — D500 Iron deficiency anemia secondary to blood loss (chronic): Secondary | ICD-10-CM | POA: Diagnosis not present

## 2015-06-13 DIAGNOSIS — Z885 Allergy status to narcotic agent status: Secondary | ICD-10-CM | POA: Diagnosis not present

## 2015-06-13 DIAGNOSIS — Z7982 Long term (current) use of aspirin: Secondary | ICD-10-CM | POA: Diagnosis not present

## 2015-06-13 DIAGNOSIS — R918 Other nonspecific abnormal finding of lung field: Secondary | ICD-10-CM | POA: Diagnosis not present

## 2015-06-13 DIAGNOSIS — J984 Other disorders of lung: Secondary | ICD-10-CM | POA: Diagnosis not present

## 2015-06-13 DIAGNOSIS — R0600 Dyspnea, unspecified: Secondary | ICD-10-CM | POA: Diagnosis not present

## 2015-06-13 DIAGNOSIS — R000 Tachycardia, unspecified: Secondary | ICD-10-CM | POA: Diagnosis not present

## 2015-06-13 DIAGNOSIS — I2510 Atherosclerotic heart disease of native coronary artery without angina pectoris: Secondary | ICD-10-CM | POA: Diagnosis not present

## 2015-06-13 DIAGNOSIS — J45909 Unspecified asthma, uncomplicated: Secondary | ICD-10-CM | POA: Diagnosis not present

## 2015-06-13 DIAGNOSIS — J9611 Chronic respiratory failure with hypoxia: Secondary | ICD-10-CM | POA: Diagnosis not present

## 2015-06-13 DIAGNOSIS — J9811 Atelectasis: Secondary | ICD-10-CM | POA: Diagnosis not present

## 2015-06-13 DIAGNOSIS — J441 Chronic obstructive pulmonary disease with (acute) exacerbation: Secondary | ICD-10-CM | POA: Diagnosis not present

## 2015-06-13 DIAGNOSIS — Z7951 Long term (current) use of inhaled steroids: Secondary | ICD-10-CM | POA: Diagnosis not present

## 2015-06-13 DIAGNOSIS — I77810 Thoracic aortic ectasia: Secondary | ICD-10-CM | POA: Diagnosis not present

## 2015-06-13 DIAGNOSIS — E785 Hyperlipidemia, unspecified: Secondary | ICD-10-CM | POA: Diagnosis not present

## 2015-06-13 DIAGNOSIS — M810 Age-related osteoporosis without current pathological fracture: Secondary | ICD-10-CM | POA: Diagnosis not present

## 2015-06-13 DIAGNOSIS — J449 Chronic obstructive pulmonary disease, unspecified: Secondary | ICD-10-CM | POA: Diagnosis not present

## 2015-06-13 LAB — HEMOGLOBIN: HEMOGLOBIN: 8.2 g/dL — AB (ref 12.0–16.0)

## 2015-06-13 MED ORDER — PANTOPRAZOLE SODIUM 40 MG PO TBEC: 40.0000 mg | DELAYED_RELEASE_TABLET | Freq: Two times a day (BID) | ORAL | Status: AC

## 2015-06-13 MED ORDER — PREDNISONE 10 MG (21) PO TBPK: 10.0000 mg | ORAL_TABLET | Freq: Every day | ORAL | Status: DC

## 2015-06-13 NOTE — Progress Notes (Signed)
Alert and oriented. Vital signs stable . No signs of acute distress. Discharge instructions given. Patient verbalizes understanding. No other issues noted at this time.   

## 2015-06-13 NOTE — Discharge Summary (Signed)
[facility] - [place] at [facility]   PATIENT NAME: Samantha Goodman    MR#:  [id number]  DATE OF BIRTH:  20-May-1935  DATE OF ADMISSION:  06/05/2015 ADMITTING PHYSICIAN: Ramonita Lab, MD  DATE OF DISCHARGE: No discharge date for patient encounter.  PRIMARY CARE PHYSICIAN: Lorie Phenix, MD     ADMISSION DIAGNOSIS:  Dyspnea [R06.00] Gastrointestinal hemorrhage with melena [K92.1]  DISCHARGE DIAGNOSIS:  Active Problems:   Acute on chronic respiratory failure (HCC)   Acute bronchitis   LGI bleed   Acute on chronic diastolic CHF (congestive heart failure) (HCC)   SVT (supraventricular tachycardia) (HCC)   Dementia   Hypothyroidism   SECONDARY DIAGNOSIS:   Past Medical History  Diagnosis Date  . COPD (chronic obstructive pulmonary disease) (HCC)   . Asthma   . CAD (coronary artery disease)   . Unstable angina pectoris (HCC)   . Hyperlipemia   . Agitation   . Fall   . Hypothyroid   . CHF (congestive heart failure) (HCC)     Diastolic CHF, EF 40%    .pro HOSPITAL COURSE:   Samantha Goodman is a 80 y.o. female with a known history of COPD, hyperlipidemia, coronary artery disease was CHF and hypothyroidism and multiple other medical problems Who presented to the ED with a chief complaint of shortness of breath and chest pain started since morning and has been progressively getting worse. Also patient has noticed bowel movement with large amount of maroon-colored blood. Patient was feeling dizzy and came into the ED. her stool was positive for Hemoccult . Chest x-ray revealed small left pleural effusion, but no focal consolidation.. Patient was recently admitted to the hospital on May 8 for COPD exacerbation and was discharged with prednisone and antibiotics. Patient was also recently admitted for GI bleed and at that time her aspirin and Plavix were discontinued. After GI bleed resolved patient herself resumed aspirin and Plavix . On admission, patient denied any  abdominal pain or nausea or vomiting. She was seen by cardiologist as well as gastroenterologist and recommended to discontinue aspirin and Plavix. After discontinuation of these medications the patient had intermittent bleeding while in the hospital requiring 2 units of packed red blood cell transfusion during this admission. Finally, she was restarted soft diet and her bleeding subsided. She was recommended to continue soft diet for the next week and then slowly advance to regular as tolerated, watching her bowel movements or black stools closely. While in the hospital. She also had a chronic respiratory failure with hypoxia, significant shortness of breath due to COPD exacerbation, she was initiated on steroids intravenously, nebulizer therapy, antibiotics, and her condition improved. By the day of discharge, patient's oxygen saturations were 98-99% on 2 L of oxygen through nasal cannula. She was felt to be stable to be discharged home. Patient's daughter, however, had significant concerns about patient go home since she felt that patient is very poorly compliant with medical therapy. Patient's family, however, was advised that  the patient is competent to make decisions for medical care and no further recommendations I could give at this time, however, recommended to follow-up with primary care physician and discuss their concerns with her. Discussion by problem: # Acute and chronic respiratory failure with hypoxia, dyspnea secondary to healthcare associated bronchitis/pneumonia and AECOPD, also mild exacerbation of acute on chronic diastolic CHF. Sputum cultures were not acceptable for testing, MRSA by PCR is negative, patient is to continue doxycycline orally to complete course, steroid taper, nebulizer therapy.  Resume oral lasix , following ins and outs closely, improved with current therapy, is about 1.8 L negative since admission . Continue breathing treatments on as needed basis. Echocardiogram done  during this admission , same as one in 2015 revealed normal ejection fraction , diastolic dysfunction.  #Hematochezia due to aspirin and Plavix, now off these medications, discussed with both GI and cardiology Dr. Lady Gary few days ago . GI is not considering any procedures, even with repeated gastrointestinal bleeding . Patient was transfused 2 units of packed red blood cells during this admission, however, is not compliant with diet and unlikely will follow  soft diet recommendations at home  # History of coronary artery disease status post stents in 2015 Off aspirin and Plavix in view of current GI bleed Cardiology consult is placed and discussed with Dr.fath, recommended to discontinue both aspirin and Plavix at this time and outpatient follow-up, echo with normal ejection fraction , diastolic dysfunction  #COPD with exacerbation ,. Taper oral steroids, continue nebs as needed. Continue doxycycline, improved clinically  #Acute on chronic diastolic congestive heart failure with bilateral pleural effusions, patient is diuresed with Lasix Intravenously, resumed oral Lasix, as patient is net negative of 1.8 L since admission , follow ins and outs, oxygenation. Follow blood pressure readings also very close as patient's blood pressure is marginal. Patient will be followed by home health nurse  # SVT with rate of 160, resumed metoprolol , resolved  #Hypothyroidism continue Synthroid  # dementia with behavioral problems, discussed with patient's family extensively , recommended to pursue guardianship, however, advised that patient is capable to make decisions about medical care at this time, also explained that no significant medications will be prescribed to control patient's behavior. The patient's daughter was recommended to refer to patient's primary care physician and possibly relay to [facility] their concerns about her driving and possibly restricting her driver's license. Patient's family was  understanding and appreciative.   DISCHARGE CONDITIONS:   Stable  CONSULTS OBTAINED:  Treatment Team:  Ramonita Lab, MD Dalia Heading, MD Scot Jun, MD Christena Deem, MD  DRUG ALLERGIES:   Allergies  Allergen Reactions  . Codeine Nausea And Vomiting  . Fluticasone-Salmeterol Other (See Comments)    Reaction:  Unknown   . Levofloxacin Other (See Comments)    Reaction:  Confusion and hallucinations   . Tiotropium Bromide Monohydrate Rash    DISCHARGE MEDICATIONS:   Current Discharge Medication List    START taking these medications   Details  doxycycline (VIBRA-TABS) 100 MG tablet Take 1 tablet (100 mg total) by mouth every 12 (twelve) hours. Qty: 18 tablet, Refills: 0    potassium chloride SA (K-DUR,KLOR-CON) 20 MEQ tablet Take 1 tablet (20 mEq total) by mouth daily. Qty: 30 tablet, Refills: 5    predniSONE (STERAPRED UNI-PAK 21 TAB) 10 MG (21) TBPK tablet Take 1 tablet (10 mg total) by mouth daily. Please take 6 pills in the morning on the day 1, then taper by one pill daily until finished, thank you Qty: 21 tablet, Refills: 0      CONTINUE these medications which have CHANGED   Details  pantoprazole (PROTONIX) 40 MG tablet Take 1 tablet (40 mg total) by mouth 2 (two) times daily. Qty: 60 tablet, Refills: 5      CONTINUE these medications which have NOT CHANGED   Details  acetaminophen (TYLENOL) 500 MG tablet Take 500-1,000 mg by mouth every 6 (six) hours as needed for mild pain, fever or headache.  albuterol (PROVENTIL HFA;VENTOLIN HFA) 108 (90 Base) MCG/ACT inhaler Inhale 2 puffs into the lungs every 4 (four) hours as needed for wheezing or shortness of breath.    atorvastatin (LIPITOR) 40 MG tablet Take 40 mg by mouth at bedtime.    budesonide-formoterol (SYMBICORT) 80-4.5 MCG/ACT inhaler Inhale 2 puffs into the lungs 2 (two) times daily.    cholecalciferol (VITAMIN D) 1000 UNITS tablet Take 1,000 Units by mouth daily.    docusate sodium  (COLACE) 250 MG capsule Take 250 mg by mouth daily.    ferrous sulfate 325 (65 FE) MG tablet take 1 tablet by mouth every evening Qty: 90 tablet, Refills: 1   Associated Diagnoses: Anemia, unspecified anemia type    fluticasone (FLONASE) 50 MCG/ACT nasal spray Place 2 sprays into both nostrils daily as needed for rhinitis.    furosemide (LASIX) 20 MG tablet Take 1 tablet (20 mg total) by mouth daily. Qty: 90 tablet, Refills: 3   Associated Diagnoses: Chronic congestive heart failure, unspecified congestive heart failure type (HCC)    guaiFENesin (MUCINEX) 600 MG 12 hr tablet Take 1 tablet (600 mg total) by mouth 2 (two) times daily. Qty: 20 tablet, Refills: 0    ipratropium-albuterol (DUONEB) 0.5-2.5 (3) MG/3ML SOLN Take 3 mLs by nebulization 4 (four) times daily as needed (for wheezing/shortness of breath).     levothyroxine (SYNTHROID, LEVOTHROID) 88 MCG tablet Take 88 mcg by mouth daily before breakfast.    metoprolol succinate (TOPROL-XL) 25 MG 24 hr tablet Take 12.5 mg by mouth daily.    montelukast (SINGULAIR) 10 MG tablet Take 10 mg by mouth at bedtime.    nicotine polacrilex (RA NICOTINE POLACRILEX) 4 MG gum Take 1 Dose by mouth as needed.    polyethylene glycol (MIRALAX / GLYCOLAX) packet Take 17 g by mouth daily.    theophylline (THEO-24) 200 MG 24 hr capsule Take 200 mg by mouth daily.      STOP taking these medications     aspirin 81 MG tablet      clopidogrel (PLAVIX) 75 MG tablet      Potassium 99 MG TABS          DISCHARGE INSTRUCTIONS:    Patient is to follow-up with primary care physician, cardiologist  If you experience worsening of your admission symptoms, develop shortness of breath, life threatening emergency, suicidal or homicidal thoughts you must seek medical attention immediately by calling 911 or calling your MD immediately  if symptoms less severe.  You Must read complete instructions/literature along with all the possible adverse  reactions/side effects for all the Medicines you take and that have been prescribed to you. Take any new Medicines after you have completely understood and accept all the possible adverse reactions/side effects.   Please note  You were cared for by a hospitalist during your hospital stay. If you have any questions about your discharge medications or the care you received while you were in the hospital after you are discharged, you can call the unit and asked to speak with the hospitalist on call if the hospitalist that took care of you is not available. Once you are discharged, your primary care physician will handle any further medical issues. Please note that NO REFILLS for any discharge medications will be authorized once you are discharged, as it is imperative that you return to your primary care physician (or establish a relationship with a primary care physician if you do not have one) for your aftercare needs so that they can  reassess your need for medications and monitor your lab values.    Today   CHIEF COMPLAINT:   Chief Complaint  Patient presents with  . Rectal Bleeding  . Shortness of Breath    HISTORY OF PRESENT ILLNESS:  Samantha Goodman  is a 80 y.o. female with a known history of COPD, hyperlipidemia, coronary artery disease was CHF and hypothyroidism and multiple other medical problems Who presented to the ED with a chief complaint of shortness of breath and chest pain started since morning and has been progressively getting worse. Also patient has noticed bowel movement with large amount of maroon-colored blood. Patient was feeling dizzy and came into the ED. her stool was positive for Hemoccult . Chest x-ray revealed small left pleural effusion, but no focal consolidation.. Patient was recently admitted to the hospital on May 8 for COPD exacerbation and was discharged with prednisone and antibiotics. Patient was also recently admitted for GI bleed and at that time her aspirin and  Plavix were discontinued. After GI bleed resolved patient herself resumed aspirin and Plavix . On admission, patient denied any abdominal pain or nausea or vomiting. She was seen by cardiologist as well as gastroenterologist and recommended to discontinue aspirin and Plavix. After discontinuation of these medications the patient had intermittent bleeding while in the hospital requiring 2 units of packed red blood cell transfusion during this admission. Finally, she was restarted soft diet and her bleeding subsided. She was recommended to continue soft diet for the next week and then slowly advance to regular as tolerated, watching her bowel movements or black stools closely. While in the hospital. She also had a chronic respiratory failure with hypoxia, significant shortness of breath due to COPD exacerbation, she was initiated on steroids intravenously, nebulizer therapy, antibiotics, and her condition improved. By the day of discharge, patient's oxygen saturations were 98-99% on 2 L of oxygen through nasal cannula. She was felt to be stable to be discharged home. Patient's daughter, however, had significant concerns about patient go home since she felt that patient is very poorly compliant with medical therapy. Patient's family, however, was advised that  the patient is competent to make decisions for medical care and no further recommendations I could give at this time, however, recommended to follow-up with primary care physician and discuss their concerns with her. Discussion by problem: # Acute and chronic respiratory failure with hypoxia, dyspnea secondary to healthcare associated bronchitis/pneumonia and AECOPD, also mild exacerbation of acute on chronic diastolic CHF. Sputum cultures were not acceptable for testing, MRSA by PCR is negative, patient is to continue doxycycline orally to complete course, steroid taper, nebulizer therapy. Resume oral lasix , following ins and outs closely, improved with  current therapy, is about 1.8 L negative since admission . Continue breathing treatments on as needed basis. Echocardiogram done during this admission , same as one in 2015 revealed normal ejection fraction , diastolic dysfunction.  #Hematochezia due to aspirin and Plavix, now off these medications, discussed with both GI and cardiology Dr. Lady Gary few days ago . GI is not considering any procedures, even with repeated gastrointestinal bleeding . Patient was transfused 2 units of packed red blood cells during this admission, however, is not compliant with diet and unlikely will follow  soft diet recommendations at home  # History of coronary artery disease status post stents in 2015 Off aspirin and Plavix in view of current GI bleed Cardiology consult is placed and discussed with Dr.fath, recommended to discontinue both aspirin and Plavix  at this time and outpatient follow-up, echo with normal ejection fraction , diastolic dysfunction  #COPD with exacerbation ,. Taper oral steroids, continue nebs as needed. Continue doxycycline, improved clinically  #Acute on chronic diastolic congestive heart failure with bilateral pleural effusions, patient is diuresed with Lasix Intravenously, resumed oral Lasix, as patient is net negative of 1.8 L since admission , follow ins and outs, oxygenation. Follow blood pressure readings also very close as patient's blood pressure is marginal. Patient will be followed by home health nurse  # SVT with rate of 160, resumed metoprolol , resolved  #Hypothyroidism continue Synthroid  # dementia with behavioral problems, discussed with patient's family extensively , recommended to pursue guardianship, however, advised that patient is capable to make decisions about medical care at this time, also explained that no significant medications will be prescribed to control patient's behavior. The patient's daughter was recommended to refer to patient's primary care physician and  possibly relay to [facility] their concerns about her driving and possibly restricting her driver's license. Patient's family was understanding and appreciative.      VITAL SIGNS:  Blood pressure 106/46, pulse 86, temperature 97.7 F (36.5 C), temperature source Oral, resp. rate 17, height 5' 5.5" (1.664 m), weight 68.04 kg (150 lb), SpO2 92 %.  I/O:   Intake/Output Summary (Last 24 hours) at 06/13/15 1247 Last data filed at 06/13/15 1053  Gross per 24 hour  Intake   1323 ml  Output      0 ml  Net   1323 ml    PHYSICAL EXAMINATION:  GENERAL:  80 y.o.-year-old patient lying in the bed with no acute distress.  EYES: Pupils equal, round, reactive to light and accommodation. No scleral icterus. Extraocular muscles intact.  HEENT: Head atraumatic, normocephalic. Oropharynx and nasopharynx clear.  NECK:  Supple, no jugular venous distention. No thyroid enlargement, no tenderness.  LUNGS: Normal breath sounds bilaterally, no wheezing, rales,rhonchi or crepitation. No use of accessory muscles of respiration.  CARDIOVASCULAR: S1, S2 normal. No murmurs, rubs, or gallops.  ABDOMEN: Soft, non-tender, non-distended. Bowel sounds present. No organomegaly or mass.  EXTREMITIES: No pedal edema, cyanosis, or clubbing.  NEUROLOGIC: Cranial nerves II through XII are intact. Muscle strength 5/5 in all extremities. Sensation intact. Gait not checked.  PSYCHIATRIC: The patient is alert and oriented x 3.  SKIN: No obvious rash, lesion, or ulcer.   DATA REVIEW:   CBC  Recent Labs Lab 06/09/15 0751  06/11/15 1139  06/13/15 0806  WBC 8.6  --   --   --   --   HGB 8.5*  < > 8.0*  < > 8.2*  HCT 26.4*  --  24.4*  --   --   PLT 287  --   --   --   --   < > = values in this interval not displayed.  Chemistries   Recent Labs Lab 06/08/15 1615 06/09/15 0004  06/12/15 0134  NA  --  139  < > 141  K  --  3.3*  < > 3.7  CL  --  100*  < > 102  CO2  --  30  < > 33*  GLUCOSE  --  88  < > 84  BUN  --  17   < > 23*  CREATININE  --  0.89  < > 0.82  CALCIUM  --  8.6*  < > 8.8*  MG 1.9  --   --   --   AST  --  25  --   --   ALT  --  13*  --   --   ALKPHOS  --  60  --   --   BILITOT  --  0.5  --   --   < > = values in this interval not displayed.  Cardiac Enzymes No results for input(s): TROPONINI in the last 168 hours.  Microbiology Results  Results for orders placed or performed during the hospital encounter of 06/05/15  Culture, sputum-assessment     Status: None   Collection Time: 06/05/15  5:13 PM  Result Value Ref Range Status   Specimen Description SPUTUM  Final   Special Requests NONE  Final   Sputum evaluation   Final    Sputum specimen not acceptable for testing.  Please recollect.   SPOKE WITH DAWN SONGSTER AT 2005 06/05/15 REQUESTED RECOLLECTION SDR    Report Status 06/05/2015 FINAL  Final  MRSA PCR Screening     Status: None   Collection Time: 06/05/15  5:13 PM  Result Value Ref Range Status   MRSA by PCR NEGATIVE NEGATIVE Final    Comment:        The GeneXpert MRSA Assay (FDA approved for NASAL specimens only), is one component of a comprehensive MRSA colonization surveillance program. It is not intended to diagnose MRSA infection nor to guide or monitor treatment for MRSA infections.   Culture, expectorated sputum-assessment     Status: None   Collection Time: 06/07/15  9:35 AM  Result Value Ref Range Status   Specimen Description EXPECTORATED SPUTUM  Final   Special Requests NONE  Final   Sputum evaluation   Final    Sputum specimen not acceptable for testing.  Please recollect.   CALLED TO KIM BURDEN FOR RECOLLECT AND REORDER AT 1857 06/07/15 [facility] MICROSCOPIC FINDINGS SUGGEST THAT THIS SPECIMEN IS NOT REPRESENTATIVE OF LOWER RESPIRATORY SECRETIONS. PLEASE RECOLLECT.    Report Status 06/07/2015 FINAL  Final    RADIOLOGY:  No results found.  EKG:   Orders placed or performed during the hospital encounter of 06/05/15  . EKG 12-Lead  . EKG 12-Lead  . ED  EKG  . ED EKG  . ED EKG  . ED EKG      Management plans discussed with the patient, family and they are in agreement.  CODE STATUS:     Code Status Orders        Start     Ordered   06/05/15 1143  Do not attempt resuscitation (DNR)   Continuous    Question Answer Comment  In the event of cardiac or respiratory ARREST Do not call a "code blue"   In the event of cardiac or respiratory ARREST Do not perform Intubation, CPR, defibrillation or ACLS   In the event of cardiac or respiratory ARREST Use medication by any route, position, wound care, and other measures to relive pain and suffering. May use oxygen, suction and manual treatment of airway obstruction as needed for comfort.   Comments RN may pronounce      06/05/15 1142    Code Status History    Date Active Date Inactive Code Status Order ID Comments User Context   05/20/2015  1:15 PM 05/23/2015  3:18 PM Full Code [id number]  Enid Baasadhika Kalisetti, MD Inpatient   04/25/2015  9:24 PM 04/27/2015  7:37 PM Full Code [id number]  Houston SirenVivek J Sainani, MD Inpatient   03/04/2015  2:46 PM 03/06/2015  7:15 PM DNR [id number]  Adrian SaranSital Mody,  MD Inpatient   11/14/2014  8:02 PM 11/17/2014  4:44 PM Full Code [id number]  Auburn Bilberry, MD Inpatient    Advance Directive Documentation        Most Recent Value   Type of Advance Directive  Healthcare Power of Attorney   Pre-existing out of facility DNR order (yellow form or pink MOST form)     "MOST" Form in Place?        TOTAL TIME TAKING CARE OF THIS PATIENT: 40 minutes.    Katharina Caper M.D on 06/13/2015 at 12:47 PM  Between 7am to 6pm - Pager - [phone]  After 6pm go to [email or web address] - password [email or web address] [facility]  [facility]  Office  [phone]  CC: Primary care physician; Lorie Phenix, MD

## 2015-06-13 NOTE — Care Management (Signed)
Patient for discharge.  Advanced informed.  Obtained order for labs

## 2015-06-14 DIAGNOSIS — J9811 Atelectasis: Secondary | ICD-10-CM | POA: Diagnosis not present

## 2015-06-14 DIAGNOSIS — J984 Other disorders of lung: Secondary | ICD-10-CM | POA: Diagnosis not present

## 2015-06-14 DIAGNOSIS — R0902 Hypoxemia: Secondary | ICD-10-CM | POA: Diagnosis not present

## 2015-06-14 DIAGNOSIS — I77810 Thoracic aortic ectasia: Secondary | ICD-10-CM | POA: Diagnosis not present

## 2015-06-15 DIAGNOSIS — R0609 Other forms of dyspnea: Secondary | ICD-10-CM | POA: Diagnosis not present

## 2015-06-15 DIAGNOSIS — R0902 Hypoxemia: Secondary | ICD-10-CM | POA: Diagnosis not present

## 2015-06-15 DIAGNOSIS — I2510 Atherosclerotic heart disease of native coronary artery without angina pectoris: Secondary | ICD-10-CM | POA: Diagnosis not present

## 2015-06-15 DIAGNOSIS — I4510 Unspecified right bundle-branch block: Secondary | ICD-10-CM | POA: Diagnosis not present

## 2015-06-15 DIAGNOSIS — J449 Chronic obstructive pulmonary disease, unspecified: Secondary | ICD-10-CM | POA: Diagnosis not present

## 2015-06-15 LAB — TYPE AND SCREEN
ABO/RH(D): B POS
ANTIBODY SCREEN: NEGATIVE
UNIT DIVISION: 0

## 2015-06-15 LAB — PREPARE RBC (CROSSMATCH)

## 2015-06-17 ENCOUNTER — Inpatient Hospital Stay
Admission: EM | Admit: 2015-06-17 | Discharge: 2015-06-20 | DRG: 190 | Disposition: A | Discharge status: Hospice | Payer: Medicare Other | Attending: Internal Medicine | Admitting: Internal Medicine

## 2015-06-17 ENCOUNTER — Emergency Department: Payer: Medicare Other

## 2015-06-17 ENCOUNTER — Encounter: Payer: Self-pay | Admitting: Emergency Medicine

## 2015-06-17 DIAGNOSIS — Z8249 Family history of ischemic heart disease and other diseases of the circulatory system: Secondary | ICD-10-CM

## 2015-06-17 DIAGNOSIS — J440 Chronic obstructive pulmonary disease with acute lower respiratory infection: Secondary | ICD-10-CM | POA: Diagnosis not present

## 2015-06-17 DIAGNOSIS — M25512 Pain in left shoulder: Secondary | ICD-10-CM | POA: Diagnosis not present

## 2015-06-17 DIAGNOSIS — K219 Gastro-esophageal reflux disease without esophagitis: Secondary | ICD-10-CM | POA: Diagnosis present

## 2015-06-17 DIAGNOSIS — Z79899 Other long term (current) drug therapy: Secondary | ICD-10-CM

## 2015-06-17 DIAGNOSIS — Z803 Family history of malignant neoplasm of breast: Secondary | ICD-10-CM | POA: Diagnosis not present

## 2015-06-17 DIAGNOSIS — A419 Sepsis, unspecified organism: Secondary | ICD-10-CM

## 2015-06-17 DIAGNOSIS — Z888 Allergy status to other drugs, medicaments and biological substances status: Secondary | ICD-10-CM

## 2015-06-17 DIAGNOSIS — Z9981 Dependence on supplemental oxygen: Secondary | ICD-10-CM | POA: Diagnosis not present

## 2015-06-17 DIAGNOSIS — I5032 Chronic diastolic (congestive) heart failure: Secondary | ICD-10-CM | POA: Diagnosis present

## 2015-06-17 DIAGNOSIS — J189 Pneumonia, unspecified organism: Secondary | ICD-10-CM | POA: Diagnosis present

## 2015-06-17 DIAGNOSIS — J9621 Acute and chronic respiratory failure with hypoxia: Secondary | ICD-10-CM | POA: Diagnosis present

## 2015-06-17 DIAGNOSIS — Z87891 Personal history of nicotine dependence: Secondary | ICD-10-CM

## 2015-06-17 DIAGNOSIS — Z515 Encounter for palliative care: Secondary | ICD-10-CM | POA: Diagnosis present

## 2015-06-17 DIAGNOSIS — Z8041 Family history of malignant neoplasm of ovary: Secondary | ICD-10-CM | POA: Diagnosis not present

## 2015-06-17 DIAGNOSIS — J439 Emphysema, unspecified: Secondary | ICD-10-CM | POA: Diagnosis not present

## 2015-06-17 DIAGNOSIS — E039 Hypothyroidism, unspecified: Secondary | ICD-10-CM | POA: Diagnosis present

## 2015-06-17 DIAGNOSIS — I2510 Atherosclerotic heart disease of native coronary artery without angina pectoris: Secondary | ICD-10-CM | POA: Diagnosis present

## 2015-06-17 DIAGNOSIS — Z885 Allergy status to narcotic agent status: Secondary | ICD-10-CM

## 2015-06-17 DIAGNOSIS — N289 Disorder of kidney and ureter, unspecified: Secondary | ICD-10-CM | POA: Diagnosis not present

## 2015-06-17 DIAGNOSIS — D649 Anemia, unspecified: Secondary | ICD-10-CM | POA: Diagnosis not present

## 2015-06-17 DIAGNOSIS — Y95 Nosocomial condition: Secondary | ICD-10-CM | POA: Diagnosis present

## 2015-06-17 DIAGNOSIS — Z7951 Long term (current) use of inhaled steroids: Secondary | ICD-10-CM

## 2015-06-17 DIAGNOSIS — R0602 Shortness of breath: Secondary | ICD-10-CM | POA: Diagnosis not present

## 2015-06-17 DIAGNOSIS — Z955 Presence of coronary angioplasty implant and graft: Secondary | ICD-10-CM

## 2015-06-17 DIAGNOSIS — J441 Chronic obstructive pulmonary disease with (acute) exacerbation: Secondary | ICD-10-CM | POA: Diagnosis not present

## 2015-06-17 DIAGNOSIS — Z801 Family history of malignant neoplasm of trachea, bronchus and lung: Secondary | ICD-10-CM | POA: Diagnosis not present

## 2015-06-17 DIAGNOSIS — I5030 Unspecified diastolic (congestive) heart failure: Secondary | ICD-10-CM | POA: Diagnosis not present

## 2015-06-17 DIAGNOSIS — Z823 Family history of stroke: Secondary | ICD-10-CM

## 2015-06-17 LAB — CBC
HEMATOCRIT: 28.3 % — AB (ref 35.0–47.0)
Hemoglobin: 9.1 g/dL — ABNORMAL LOW (ref 12.0–16.0)
MCH: 25.7 pg — ABNORMAL LOW (ref 26.0–34.0)
MCHC: 32.3 g/dL (ref 32.0–36.0)
MCV: 79.8 fL — ABNORMAL LOW (ref 80.0–100.0)
PLATELETS: 526 10*3/uL — AB (ref 150–440)
RBC: 3.54 MIL/uL — AB (ref 3.80–5.20)
RDW: 16.6 % — AB (ref 11.5–14.5)
WBC: 9.1 10*3/uL (ref 3.6–11.0)

## 2015-06-17 LAB — BLOOD GAS, ARTERIAL
ACID-BASE EXCESS: 10.8 mmol/L — AB (ref 0.0–3.0)
BICARBONATE: 34.1 meq/L — AB (ref 21.0–28.0)
FIO2: 32
O2 SAT: 97.9 %
PATIENT TEMPERATURE: 37
PO2 ART: 89 mmHg (ref 83.0–108.0)
pCO2 arterial: 39 mmHg (ref 32.0–48.0)
pH, Arterial: 7.55 — ABNORMAL HIGH (ref 7.350–7.450)

## 2015-06-17 LAB — TROPONIN I: Troponin I: <0.03 ng/mL (ref <0.031)

## 2015-06-17 LAB — LACTIC ACID, PLASMA
Lactic Acid, Venous: 1.2 mmol/L (ref 0.5–2.0)
Lactic Acid, Venous: 3.5 mmol/L (ref 0.5–2.0)

## 2015-06-17 LAB — BASIC METABOLIC PANEL
Anion gap: 13 (ref 5–15)
BUN: 20 mg/dL (ref 6–20)
CHLORIDE: 98 mmol/L — AB (ref 101–111)
CO2: 29 mmol/L (ref 22–32)
Calcium: 9.1 mg/dL (ref 8.9–10.3)
Creatinine, Ser: 1.07 mg/dL — ABNORMAL HIGH (ref 0.44–1.00)
GFR, EST AFRICAN AMERICAN: 56 mL/min — AB (ref >60)
GFR, EST NON AFRICAN AMERICAN: 48 mL/min — AB (ref >60)
Glucose, Bld: 188 mg/dL — ABNORMAL HIGH (ref 65–99)
POTASSIUM: 3.6 mmol/L (ref 3.5–5.1)
SODIUM: 140 mmol/L (ref 135–145)

## 2015-06-17 LAB — BRAIN NATRIURETIC PEPTIDE: B NATRIURETIC PEPTIDE 5: 37 pg/mL (ref 0.0–100.0)

## 2015-06-17 MED ORDER — VANCOMYCIN HCL IN DEXTROSE 1-5 GM/200ML-% IV SOLN
INTRAVENOUS | Status: AC
  Filled 2015-06-17: qty 200

## 2015-06-17 MED ORDER — DOCUSATE SODIUM 100 MG PO CAPS
200.0000 mg | ORAL_CAPSULE | Freq: Every day | ORAL | Status: DC
  Administered 2015-06-17 – 2015-06-20 (×4): 200 mg via ORAL
  Filled 2015-06-17 (×4): qty 2

## 2015-06-17 MED ORDER — ENOXAPARIN SODIUM 40 MG/0.4ML ~~LOC~~ SOLN
40.0000 mg | SUBCUTANEOUS | Status: DC
  Administered 2015-06-17 – 2015-06-19 (×3): 40 mg via SUBCUTANEOUS
  Filled 2015-06-17 (×4): qty 0.4

## 2015-06-17 MED ORDER — POLYETHYLENE GLYCOL 3350 17 G PO PACK
17.0000 g | PACK | Freq: Every day | ORAL | Status: DC
  Administered 2015-06-17 – 2015-06-20 (×4): 17 g via ORAL
  Filled 2015-06-17 (×4): qty 1

## 2015-06-17 MED ORDER — METOPROLOL SUCCINATE ER 25 MG PO TB24
12.5000 mg | ORAL_TABLET | Freq: Every day | ORAL | Status: DC
  Filled 2015-06-17: qty 1

## 2015-06-17 MED ORDER — GUAIFENESIN ER 600 MG PO TB12
600.0000 mg | ORAL_TABLET | Freq: Two times a day (BID) | ORAL | Status: DC
  Administered 2015-06-17 – 2015-06-20 (×6): 600 mg via ORAL
  Filled 2015-06-17 (×6): qty 1

## 2015-06-17 MED ORDER — IPRATROPIUM-ALBUTEROL 0.5-2.5 (3) MG/3ML IN SOLN
3.0000 mL | RESPIRATORY_TRACT | Status: DC
  Administered 2015-06-17 – 2015-06-20 (×18): 3 mL via RESPIRATORY_TRACT
  Filled 2015-06-17 (×18): qty 3

## 2015-06-17 MED ORDER — ONDANSETRON HCL 4 MG/2ML IJ SOLN: 4.0000 mg | Freq: Four times a day (QID) | INTRAMUSCULAR | Status: DC | PRN

## 2015-06-17 MED ORDER — ACETAMINOPHEN 500 MG PO TABS
500.0000 mg | ORAL_TABLET | Freq: Four times a day (QID) | ORAL | Status: DC | PRN
Start: 2015-06-17 — End: 2015-06-20
  Administered 2015-06-18 – 2015-06-19 (×2): 1000 mg via ORAL
  Filled 2015-06-17 (×2): qty 2

## 2015-06-17 MED ORDER — SODIUM CHLORIDE 0.9 % IV BOLUS (SEPSIS)
1000.0000 mL | Freq: Once | INTRAVENOUS | Status: AC
  Administered 2015-06-17: 1000 mL via INTRAVENOUS

## 2015-06-17 MED ORDER — METHYLPREDNISOLONE SODIUM SUCC 125 MG IJ SOLR
60.0000 mg | INTRAMUSCULAR | Status: DC
  Administered 2015-06-17 – 2015-06-19 (×3): 60 mg via INTRAVENOUS
  Filled 2015-06-17 (×3): qty 2

## 2015-06-17 MED ORDER — ONDANSETRON HCL 4 MG PO TABS: 4.0000 mg | ORAL_TABLET | Freq: Four times a day (QID) | ORAL | Status: DC | PRN

## 2015-06-17 MED ORDER — MOMETASONE FURO-FORMOTEROL FUM 100-5 MCG/ACT IN AERO
2.0000 | INHALATION_SPRAY | Freq: Two times a day (BID) | RESPIRATORY_TRACT | Status: DC
  Administered 2015-06-17 – 2015-06-20 (×6): 2 via RESPIRATORY_TRACT
  Filled 2015-06-17: qty 8.8

## 2015-06-17 MED ORDER — PIPERACILLIN-TAZOBACTAM 3.375 G IVPB 30 MIN
3.3750 g | Freq: Once | INTRAVENOUS | Status: AC
  Administered 2015-06-17: 3.375 g via INTRAVENOUS
  Filled 2015-06-17: qty 50

## 2015-06-17 MED ORDER — THEOPHYLLINE ER 200 MG PO CP24
200.0000 mg | ORAL_CAPSULE | Freq: Every day | ORAL | Status: DC
  Administered 2015-06-17 – 2015-06-20 (×4): 200 mg via ORAL
  Filled 2015-06-17 (×4): qty 1

## 2015-06-17 MED ORDER — LEVOTHYROXINE SODIUM 88 MCG PO TABS
88.0000 ug | ORAL_TABLET | Freq: Every day | ORAL | Status: DC
  Administered 2015-06-18 – 2015-06-20 (×3): 88 ug via ORAL
  Filled 2015-06-17 (×3): qty 1

## 2015-06-17 MED ORDER — FERROUS SULFATE 325 (65 FE) MG PO TABS
325.0000 mg | ORAL_TABLET | Freq: Every day | ORAL | Status: DC
  Administered 2015-06-18 – 2015-06-19 (×2): 325 mg via ORAL
  Filled 2015-06-17 (×2): qty 1

## 2015-06-17 MED ORDER — VITAMIN D 1000 UNITS PO TABS
1000.0000 [IU] | ORAL_TABLET | Freq: Every day | ORAL | Status: DC
  Administered 2015-06-17 – 2015-06-20 (×4): 1000 [IU] via ORAL
  Filled 2015-06-17 (×4): qty 1

## 2015-06-17 MED ORDER — SODIUM CHLORIDE 0.9% FLUSH
3.0000 mL | Freq: Two times a day (BID) | INTRAVENOUS | Status: DC
  Administered 2015-06-17 – 2015-06-20 (×6): 3 mL via INTRAVENOUS

## 2015-06-17 MED ORDER — IOPAMIDOL (ISOVUE-370) INJECTION 76%
75.0000 mL | Freq: Once | INTRAVENOUS | Status: AC | PRN
  Administered 2015-06-17: 60 mL via INTRAVENOUS

## 2015-06-17 MED ORDER — ATORVASTATIN CALCIUM 20 MG PO TABS
40.0000 mg | ORAL_TABLET | Freq: Every day | ORAL | Status: DC
  Administered 2015-06-17 – 2015-06-19 (×3): 40 mg via ORAL
  Filled 2015-06-17 (×3): qty 2

## 2015-06-17 MED ORDER — SODIUM CHLORIDE 0.9 % IV SOLN: INTRAVENOUS | Status: DC

## 2015-06-17 MED ORDER — NICOTINE POLACRILEX 2 MG MT GUM: 4.0000 mg | CHEWING_GUM | OROMUCOSAL | Status: DC | PRN

## 2015-06-17 MED ORDER — FLUTICASONE PROPIONATE 50 MCG/ACT NA SUSP
2.0000 | Freq: Every day | NASAL | Status: DC | PRN
  Filled 2015-06-17: qty 16

## 2015-06-17 MED ORDER — VANCOMYCIN HCL 10 G IV SOLR
1250.0000 mg | Freq: Once | INTRAVENOUS | Status: AC
  Administered 2015-06-17: 1250 mg via INTRAVENOUS
  Filled 2015-06-17: qty 1250

## 2015-06-17 MED ORDER — PANTOPRAZOLE SODIUM 40 MG PO TBEC
40.0000 mg | DELAYED_RELEASE_TABLET | Freq: Two times a day (BID) | ORAL | Status: DC
  Administered 2015-06-17 – 2015-06-20 (×6): 40 mg via ORAL
  Filled 2015-06-17 (×6): qty 1

## 2015-06-17 MED ORDER — MONTELUKAST SODIUM 10 MG PO TABS
10.0000 mg | ORAL_TABLET | Freq: Every day | ORAL | Status: DC
  Administered 2015-06-17 – 2015-06-19 (×3): 10 mg via ORAL
  Filled 2015-06-17 (×3): qty 1

## 2015-06-17 NOTE — ED Notes (Signed)
Patient transported to CT 

## 2015-06-17 NOTE — Progress Notes (Signed)
Pt has hx of CHF. Pt has wheezing; Coughing a lot; Dr. Anne HahnWillis notified and orders received to stop IV fluids. Primary nurse to continue to monitor.

## 2015-06-17 NOTE — Progress Notes (Signed)
Primary Nurse was notified by Lab that pt lactic acid was 3.5. Dr. Imogene Burnhen notified and orders received to recheck lactic acid tomorrow AM. Primary Nurse to continue to monitor.

## 2015-06-17 NOTE — ED Provider Notes (Signed)
[facility] Emergency Department Provider Note  ____________________________________________  Time seen: Approximately 1:25 PM  I have reviewed the triage vital signs and the nursing notes.   HISTORY  Chief Complaint Shortness of Breath and Shoulder Pain    HPI Samantha Goodman is a 80 y.o. female with a history of COPD on 2 L nasal cannula, CHF, CAD presenting with shortness of breath. The patient reports that she has had a progressive decline in her pulmonary status. She was seen at [facility] in the emergency department on Thursday and discharged home. 2 hours after arriving home, her breathing worsened and she was admitted at [facility] until Saturday. She was discharged with doxycycline, steroid taper, and ipratropium. She was also told that her symptoms may be the result of progression of her baseline pulmonary disease. Since yesterday, her respiratory status has worsened and she feels short of breath even at rest. Today, she was sitting in her recliner and had acute shortness of breath. She has not had any change in her chronic cough.  She is not had any chest pain, pressure or tightness, palpitations or syncope. She does have lightheadedness, even while sitting down. She denies any lower extremity swelling or calf pain. She has also had a left shoulder "ache" that started 1 hour prior to her discharge at [facility] and has not responded to Tylenol. No cough or cold symptoms, fever or chills.   Past Medical History  Diagnosis Date  . COPD (chronic obstructive pulmonary disease) ([place])   . Asthma   . CAD (coronary artery disease)   . Unstable angina pectoris ([place])   . Hyperlipemia   . Agitation   . Fall   . Hypothyroid   . CHF (congestive heart failure) ([facility])     Diastolic CHF, EF 74%    Patient Active Problem List   Diagnosis Date Noted  . Dementia 06/13/2015  . Hypothyroidism 06/13/2015  . Acute on chronic respiratory failure ([place]) 06/10/2015  . Acute bronchitis  06/10/2015  . Acute on chronic diastolic CHF (congestive heart failure) ([place]) 06/10/2015  . SVT (supraventricular tachycardia) ([place]) 06/10/2015  . LGI bleed 06/05/2015  . COPD exacerbation ([place]) 05/20/2015  . CAD in native artery 05/09/2015  . GI bleed 04/25/2015  . COPD (chronic obstructive pulmonary disease) ([place]) 11/21/2014  . Senile purpura ([place]) 11/21/2014  . Oxygen dependent 07/13/2014  . Acid reflux 07/09/2014  . Allergic rhinitis 07/06/2014  . Anxiety 07/06/2014  . Body mass index (BMI) of 23.0-23.9 in adult 07/06/2014  . CCF (congestive cardiac failure) ([place]) 07/06/2014  . Chronic constipation 07/06/2014  . Dizziness 07/06/2014  . Edema extremities 07/06/2014  . Bleeding gastrointestinal 07/06/2014  . Hypercholesteremia 07/06/2014  . Decreased potassium in the blood 07/06/2014  . Adult hypothyroidism 07/06/2014  . OP (osteoporosis) 07/06/2014  . Procidentia of rectum 07/06/2014  . Tobacco abuse, in remission 07/06/2014  . Anemia 07/04/2014  . Arteriosclerosis of coronary artery 03/09/2013    Past Surgical History  Procedure Laterality Date  . Eye surgery    . Abdominal hysterectomy    . Appendectomy    . Ankle surgery Right     Current Outpatient Rx  Name  Route  Sig  Dispense  Refill  . acetaminophen (TYLENOL) 500 MG tablet   Oral   Take 500-1,000 mg by mouth every 6 (six) hours as needed for mild pain, fever or headache.          . albuterol (PROVENTIL HFA;VENTOLIN HFA) 108 (90 Base) MCG/ACT inhaler  Inhalation   Inhale 2 puffs into the lungs every 4 (four) hours as needed for wheezing or shortness of breath.         Marland Kitchen atorvastatin (LIPITOR) 40 MG tablet   Oral   Take 40 mg by mouth at bedtime.         . budesonide-formoterol (SYMBICORT) 80-4.5 MCG/ACT inhaler   Inhalation   Inhale 2 puffs into the lungs 2 (two) times daily.         . cholecalciferol (VITAMIN D) 1000 UNITS tablet   Oral   Take 1,000 Units by mouth daily.         Marland Kitchen  docusate sodium (COLACE) 250 MG capsule   Oral   Take 250 mg by mouth daily.         Marland Kitchen doxycycline (VIBRA-TABS) 100 MG tablet   Oral   Take 1 tablet (100 mg total) by mouth every 12 (twelve) hours.   18 tablet   0   . ferrous sulfate 325 (65 FE) MG tablet      take 1 tablet by mouth every evening   90 tablet   1   . fluticasone (FLONASE) 50 MCG/ACT nasal spray   Each Nare   Place 2 sprays into both nostrils daily as needed for rhinitis.         . furosemide (LASIX) 20 MG tablet   Oral   Take 1 tablet (20 mg total) by mouth daily.   90 tablet   3   . guaiFENesin (MUCINEX) 600 MG 12 hr tablet   Oral   Take 1 tablet (600 mg total) by mouth 2 (two) times daily.   20 tablet   0   . ipratropium-albuterol (DUONEB) 0.5-2.5 (3) MG/3ML SOLN   Nebulization   Take 3 mLs by nebulization 4 (four) times daily as needed (for wheezing/shortness of breath).          Marland Kitchen levothyroxine (SYNTHROID, LEVOTHROID) 88 MCG tablet   Oral   Take 88 mcg by mouth daily before breakfast.         . metoprolol succinate (TOPROL-XL) 25 MG 24 hr tablet   Oral   Take 12.5 mg by mouth daily.         . montelukast (SINGULAIR) 10 MG tablet   Oral   Take 10 mg by mouth at bedtime.         . nicotine polacrilex (RA NICOTINE POLACRILEX) 4 MG gum   Oral   Take 1 Dose by mouth as needed.         . pantoprazole (PROTONIX) 40 MG tablet   Oral   Take 1 tablet (40 mg total) by mouth 2 (two) times daily.   60 tablet   5   . polyethylene glycol (MIRALAX / GLYCOLAX) packet   Oral   Take 17 g by mouth daily.         . potassium chloride SA (K-DUR,KLOR-CON) 20 MEQ tablet   Oral   Take 1 tablet (20 mEq total) by mouth daily.   30 tablet   5   . predniSONE (STERAPRED UNI-PAK 21 TAB) 10 MG (21) TBPK tablet   Oral   Take 1 tablet (10 mg total) by mouth daily. Please take 6 pills in the morning on the day 1, then taper by one pill daily until finished, thank you   21 tablet   0   .  theophylline (THEO-24) 200 MG 24 hr capsule   Oral   Take 200  mg by mouth daily.           Allergies Codeine; Fluticasone-salmeterol; Levofloxacin; and Tiotropium bromide monohydrate  Family History  Problem Relation Age of Onset  . Stroke Mother   . Prostate cancer Father   . CAD Brother   . Ovarian cancer Sister   . Breast cancer Sister   . Lung cancer Sister     Social History Social History  Substance Use Topics  . Smoking status: Former Smoker -- 0.25 packs/day for 65 years    Quit date: 11/11/2013  . Smokeless tobacco: Never Used     Comment: Quit smoking recently  . Alcohol Use: No    Review of Systems Constitutional: No fever/chills.Positive lightheadedness. Negative syncope. Eyes: No visual changes. ENT: No sore throat. No congestion or rhinorrhea. Cardiovascular: Denies chest pain. Denies palpitations. Respiratory: Positive shortness of breath.  Positive chronic and unchanged cough. Gastrointestinal: No abdominal pain.  No nausea, no vomiting.  No diarrhea.  No constipation. Genitourinary: Negative for dysuria. Musculoskeletal: Negative for back pain. Positive left shoulder pain. Skin: Negative for rash. Neurological: Negative for headaches. No focal numbness, tingling or weakness.   10-point ROS otherwise negative.  ____________________________________________   PHYSICAL EXAM:  VITAL SIGNS: ED Triage Vitals  Enc Vitals Group     BP 06/17/15 1231 103/47 mmHg     Pulse Rate 06/17/15 1231 85     Resp 06/17/15 1231 24     Temp 06/17/15 1231 98.4 F (36.9 C)     Temp Source 06/17/15 1231 Oral     SpO2 06/17/15 1231 95 %     Weight 06/17/15 1231 132 lb (59.875 kg)     Height 06/17/15 1231 _0  (1.651 m)     Head Cir --      Peak Flow --      Pain Score 06/17/15 1232 10     Pain Loc --      Pain Edu? --      Excl. in [place]? --     Constitutional: Alert and oriented. Well appearing and in no acute distress. Answers questions appropriately. Eyes:  Conjunctivae are normal.  EOMI. No scleral icterus.No eye discharge. Head: Atraumatic. Nose: No congestion/rhinnorhea. Mouth/Throat: Mucous membranes are moist.  Neck: No stridor.  Supple.  No JVD. No meningismus. Cardiovascular: Normal rate, regular rhythm. No murmurs, rubs or gallops.  Respiratory: Mild tachypnea without accessory muscle use or retractions. Breath sounds are clear bilaterally without wheezes rales or rhonchi. Gastrointestinal: Soft, nontender and nondistended.  No guarding or rebound.  No peritoneal signs. Musculoskeletal: No LE edema. No ttp in the calves or palpable cords.  Negative Homan's sign. Full range of motion of the left wrist, elbow, and shoulder without pain. Skin over the left shoulder is intact. Neurologic:  A&Ox3.  Speech is clear.  Face and smile are symmetric.  EOMI.  Moves all extremities well. Skin:  Skin is warm, dry and intact. No rash noted. Psychiatric: Mood and affect are normal. Speech and behavior are normal.  Normal judgement.  ____________________________________________   LABS (all labs ordered are listed, but only abnormal results are displayed)  Labs Reviewed  BASIC METABOLIC PANEL - Abnormal; Notable for the following:    Chloride 98 (*)    Glucose, Bld 188 (*)    Creatinine, Ser 1.07 (*)    GFR calc non Af Amer 48 (*)    GFR calc Af Amer 56 (*)    All other components within normal limits  CBC -  Abnormal; Notable for the following:    RBC 3.54 (*)    Hemoglobin 9.1 (*)    HCT 28.3 (*)    MCV 79.8 (*)    MCH 25.7 (*)    RDW 16.6 (*)    Platelets 526 (*)    All other components within normal limits  BLOOD GAS, VENOUS - Abnormal; Notable for the following:    pO2, Ven <31.0 (*)    Bicarbonate 37.6 (*)    Acid-Base Excess 11.6 (*)    All other components within normal limits  BLOOD GAS, ARTERIAL - Abnormal; Notable for the following:    pH, Arterial 7.55 (*)    Bicarbonate 34.1 (*)    Acid-Base Excess 10.8 (*)    All other  components within normal limits  CULTURE, BLOOD (ROUTINE X 2)  CULTURE, BLOOD (ROUTINE X 2)  URINE CULTURE  TROPONIN I  BRAIN NATRIURETIC PEPTIDE  LACTIC ACID, PLASMA  LACTIC ACID, PLASMA  URINALYSIS COMPLETEWITH MICROSCOPIC ([facility] ONLY)   ____________________________________________  EKG  ED ECG REPORT I, Eula Listen, the attending physician, personally viewed and interpreted this ECG.   Date: 06/17/2015  EKG Time: 1235  Rate: 88  Rhythm: RBBB  Axis: Normal  Intervals:none  ST&T Change: No ST elevation.  ____________________________________________  RADIOLOGY  Dg Chest 2 View  06/17/2015  CLINICAL DATA:  Chronic shortness of breath which has worsened today. Initial encounter. EXAM: CHEST  2 VIEW COMPARISON:  Single view of the chest 06/08/2015 and 06/05/2015. CT chest 03/05/2015. FINDINGS: The lungs are emphysematous with stroke linear basilar atelectasis or scar. No pneumothorax or pleural effusion. T11, T12 and L1 compression fractures are unchanged since the prior CT. IMPRESSION: Emphysema without acute disease. Electronically Signed   By: Inge Rise M.D.   On: 06/17/2015 13:17   Ct Angio Chest Pe W/cm &/or Wo Cm  06/17/2015  CLINICAL DATA:  Left shoulder pain and shortness of breath for the past 3 days. Coronary artery disease with stent placement. Ex-smoker. EXAM: CT ANGIOGRAPHY CHEST WITH CONTRAST TECHNIQUE: Multidetector CT imaging of the chest was performed using the standard protocol during bolus administration of intravenous contrast. Multiplanar CT image reconstructions and MIPs were obtained to evaluate the vascular anatomy. CONTRAST:  60 cc Isovue 370 COMPARISON:  Chest radiographs obtained earlier today and chest CT dated 03/05/2015. Abdomen CT dated 02/09/2014. FINDINGS: Mediastinum/Lymph Nodes: Normally opacified pulmonary arteries with no pulmonary arterial filling defects seen. Atheromatous coronary artery calcifications. No enlarged lymph nodes.  Lungs/Pleura: Biapical pleural and parenchymal scarring. Interval tree in bud opacities in the right lower lobe. Interval similar changes, to a lesser degree, in the left lower lobe. The previously demonstrated 5 mm irregular density in the left lower lobe is 7 mm in diameter on image number 75, previously 7 mm in corresponding diameter. Upper abdomen: Stable low density right adrenal nodule. This measures 4.0 x 2.0 cm on image number 120 of series 4 and previously measured -5 Hounsfield units in density on the noncontrast study dated 03/05/2015. A similar appearing in similar density nodule in the left adrenal gland is also unchanged, measuring 2.9 x 1.3 cm on image number 132 of series 4. These have also not changed significantly since 02/09/2014. Musculoskeletal: No significant change in multiple vertebral compression deformities without acute fracture lines. Mild bony retropulsion at the T12 level is unchanged. Review of the MIP images confirms the above findings. IMPRESSION: 1. Interval the tree in bud opacities in both lower lobes, greater on the right. These are  concerning for active infection. 2. No pulmonary emboli. 3. Stable 7 mm irregular nodule in the left lower lobe. Non-contrast chest CT at 6 months is recommended. If the nodules are stable at time of repeat CT, then future CT at 18-24 months (from today's scan) is considered optional for low-risk patients, but is recommended for high-risk patients. This recommendation follows the consensus statement: Guidelines for Management of Incidental Pulmonary Nodules Detected on CT Images:From the Fleischner Society 2017; published online before print (10.1148/[id number]). 4. Stable bilateral adrenal adenomas. Electronically Signed   By: Claudie Revering M.D.   On: 06/17/2015 15:06   Dg Shoulder Left  06/17/2015  CLINICAL DATA:  Shoulder pain for 2 days without known trauma. EXAM: LEFT SHOULDER - 2+ VIEW COMPARISON:  None. FINDINGS: No fracture identified.  There are degenerative changes at the [facility] joint. The patient could not abduct her are precluding an axillary view. As a result, evaluation for dislocation is limited but none is seen. IMPRESSION: Negative. Electronically Signed   By: Dorise Bullion III M.D   On: 06/17/2015 14:14    ____________________________________________   PROCEDURES  Procedure(s) performed: None  Critical Care performed: Yes  CRITICAL CARE Performed by: Eula Listen   Total critical care time: 35 minutes  Critical care time was exclusive of separately billable procedures and treating other patients.  Critical care was necessary to treat or prevent imminent or life-threatening deterioration.  Critical care was time spent personally by me on the following activities: development of treatment plan with patient and/or surrogate as well as nursing, discussions with consultants, evaluation of patient's response to treatment, examination of patient, obtaining history from patient or surrogate, ordering and performing treatments and interventions, ordering and review of laboratory studies, ordering and review of radiographic studies, pulse oximetry and re-evaluation of patient's condition.  ____________________________________________   INITIAL IMPRESSION / ASSESSMENT AND PLAN / ED COURSE  Pertinent labs & imaging results that were available during my care of the patient were reviewed by me and considered in my medical decision making (see chart for details).  80 y.o. female with a history of COPD, CHF and CAD presenting with worsening shortness of breath, and a new left shoulder pain. Overall, given the history that the patient is presenting with, it is possible that she is having a progressive decline in her chronic lung disease. The shoulder pain is new and may be due to a musculoskeletal pain although she has no pain when I range her. I am concerned that this may be atypical cardiac pain, so will look for  positive troponin or PE. The patient is oxygenating at 95% on HER-2 liters baseline nasal cannula.  ----------------------------------------- 3:14 PM on 06/17/2015 -----------------------------------------  The patient's CT scan does not show PE. She does have evidence of bilateral infection, so I will treat her empirically for hospital-acquired pneumonia given her recent hospitalization. Her left shoulder x-ray does not show any acute process. The patient continues to have an oxygen saturation of 95% or greater on HER-2 liters nasal cannula. She does have a mild bump in her creatinine today. Since arriving to the emergency department, the patient has had one blood pressure of 86/44 and I am concerned for sepsis. We will give her immediate and aggressive fluid hydration. The patient will be admitted to the hospital for further treatment and evaluation.  ____________________________________________  FINAL CLINICAL IMPRESSION(S) / ED DIAGNOSES  Final diagnoses:  Hospital-acquired pneumonia  Sepsis, due to unspecified organism [facility])      NEW MEDICATIONS  STARTED DURING THIS VISIT:  New Prescriptions   No medications on file     Eula Listen, MD 06/17/15 1517

## 2015-06-17 NOTE — ED Notes (Signed)
MD at bedside. 

## 2015-06-17 NOTE — ED Notes (Signed)
Patient transported to X-ray 

## 2015-06-17 NOTE — H&P (Signed)
[facility] - [place] at [facility]   PATIENT NAME: Samantha Goodman    MR#:  [id number]  DATE OF BIRTH:  12/14/1935  DATE OF ADMISSION:  06/17/2015  PRIMARY CARE PHYSICIAN: Lorie Phenix, MD   REQUESTING/REFERRING PHYSICIAN: Dr. Rockne Menghini  CHIEF COMPLAINT:   Chief Complaint  Patient presents with  . Shortness of Breath  . Shoulder Pain    HISTORY OF PRESENT ILLNESS:  Samantha Goodman  is a 80 y.o. female with a known history of CAD status post stent, COPD on 2 L home oxygen, tobacco use disorder, congestive heart failure with diastolic dysfunction, hypothyroidism presents to the hospital secondary to worsening shortness of breath. Patient was in the hospital last week and just got discharged 4 days ago after she was admitted for GI bleed. Her aspirin and Plavix were discontinued at the time. She did receive transfusion during her last hospitalization. There is a concern if patient has been compliant with her medications at home. However today she got discharged, she had more shortness of breath and went to [facility] and was admitted there and was discharged on doxycycline and prednisone taper for COPD and bronchitis. She states her symptoms haven't improved abdominal continues to feel short of breath and presented to the hospital. CT of the chest here reveals no pulmonary emboli, has COPD and also right lower lobe infection. She is being admitted for healthcare acquired pneumonia and COPD exacerbation. She continues to wheeze but requiring only 2 L oxygen at this time.  PAST MEDICAL HISTORY:   Past Medical History  Diagnosis Date  . COPD (chronic obstructive pulmonary disease) (HCC)   . Asthma   . CAD (coronary artery disease)   . Unstable angina pectoris (HCC)   . Hyperlipemia   . Agitation   . Fall   . Hypothyroid   . CHF (congestive heart failure) (HCC)     Diastolic CHF, EF 40%    PAST SURGICAL HISTORY:   Past Surgical History  Procedure Laterality  Date  . Eye surgery    . Abdominal hysterectomy    . Appendectomy    . Ankle surgery Right     SOCIAL HISTORY:   Social History  Substance Use Topics  . Smoking status: Former Smoker -- 0.25 packs/day for 65 years    Quit date: 11/11/2013  . Smokeless tobacco: Never Used     Comment: Quit smoking recently  . Alcohol Use: No    FAMILY HISTORY:   Family History  Problem Relation Age of Onset  . Stroke Mother   . Prostate cancer Father   . CAD Brother   . Ovarian cancer Sister   . Breast cancer Sister   . Lung cancer Sister     DRUG ALLERGIES:   Allergies  Allergen Reactions  . Codeine Nausea And Vomiting  . Fluticasone-Salmeterol Other (See Comments)    Reaction:  Unknown   . Levofloxacin Other (See Comments)    Reaction:  Confusion and hallucinations   . Tiotropium Bromide Monohydrate Rash    REVIEW OF SYSTEMS:   Review of Systems  Constitutional: Positive for malaise/fatigue. Negative for fever, chills and weight loss.  HENT: Negative for ear discharge, ear pain, hearing loss and nosebleeds.   Eyes: Negative for blurred vision, double vision and photophobia.  Respiratory: Positive for shortness of breath. Negative for cough, hemoptysis and wheezing.   Cardiovascular: Negative for chest pain, palpitations, orthopnea and leg swelling.  Gastrointestinal: Negative for heartburn, nausea, vomiting, abdominal pain, diarrhea,  constipation and melena.  Genitourinary: Negative for dysuria, urgency, frequency and hematuria.  Musculoskeletal: Positive for joint pain. Negative for myalgias, back pain and neck pain.       Left shoulder pain  Skin: Negative for rash.  Neurological: Negative for dizziness, tingling, sensory change, speech change, focal weakness and headaches.  Endo/Heme/Allergies: Does not bruise/bleed easily.  Psychiatric/Behavioral: Negative for depression.    MEDICATIONS AT HOME:   Prior to Admission medications   Medication Sig Start Date End Date  Taking? Authorizing Provider  acetaminophen (TYLENOL) 500 MG tablet Take 500-1,000 mg by mouth every 6 (six) hours as needed for mild pain, fever or headache.    Yes Historical Provider, MD  albuterol (PROVENTIL HFA;VENTOLIN HFA) 108 (90 Base) MCG/ACT inhaler Inhale 2 puffs into the lungs every 4 (four) hours as needed for wheezing or shortness of breath.   Yes Historical Provider, MD  atorvastatin (LIPITOR) 40 MG tablet Take 40 mg by mouth at bedtime.   Yes Historical Provider, MD  budesonide-formoterol (SYMBICORT) 80-4.5 MCG/ACT inhaler Inhale 2 puffs into the lungs 2 (two) times daily.   Yes Historical Provider, MD  cholecalciferol (VITAMIN D) 1000 UNITS tablet Take 1,000 Units by mouth daily.   Yes Historical Provider, MD  docusate sodium (COLACE) 250 MG capsule Take 250 mg by mouth daily.   Yes Historical Provider, MD  doxycycline (VIBRA-TABS) 100 MG tablet Take 1 tablet (100 mg total) by mouth every 12 (twelve) hours. 06/10/15  Yes Katharina Caper, MD  ferrous sulfate 325 (65 FE) MG tablet Take 325 mg by mouth daily with supper.   Yes Historical Provider, MD  fluticasone (FLONASE) 50 MCG/ACT nasal spray Place 2 sprays into both nostrils daily as needed for rhinitis.   Yes Historical Provider, MD  furosemide (LASIX) 20 MG tablet Take 1 tablet (20 mg total) by mouth daily. 10/16/14  Yes Lorie Phenix, MD  guaiFENesin (MUCINEX) 600 MG 12 hr tablet Take 1 tablet (600 mg total) by mouth 2 (two) times daily. 11/17/14  Yes Gale Journey, MD  ipratropium (ATROVENT HFA) 17 MCG/ACT inhaler Inhale 2 puffs into the lungs every 6 (six) hours as needed for wheezing.   Yes Historical Provider, MD  ipratropium-albuterol (DUONEB) 0.5-2.5 (3) MG/3ML SOLN Take 3 mLs by nebulization 4 (four) times daily as needed (for wheezing/shortness of breath).    Yes Historical Provider, MD  levothyroxine (SYNTHROID, LEVOTHROID) 88 MCG tablet Take 88 mcg by mouth daily before breakfast.   Yes Historical Provider, MD  metoprolol  succinate (TOPROL-XL) 25 MG 24 hr tablet Take 12.5 mg by mouth daily.   Yes Historical Provider, MD  montelukast (SINGULAIR) 10 MG tablet Take 10 mg by mouth at bedtime.   Yes Historical Provider, MD  nicotine polacrilex (RA NICOTINE POLACRILEX) 4 MG gum Take 4 mg by mouth as needed for smoking cessation.    Yes Historical Provider, MD  pantoprazole (PROTONIX) 40 MG tablet Take 1 tablet (40 mg total) by mouth 2 (two) times daily. 06/13/15  Yes Katharina Caper, MD  polyethylene glycol (MIRALAX / GLYCOLAX) packet Take 17 g by mouth daily.   Yes Historical Provider, MD  potassium chloride SA (K-DUR,KLOR-CON) 20 MEQ tablet Take 1 tablet (20 mEq total) by mouth daily. 06/10/15  Yes Katharina Caper, MD  predniSONE (STERAPRED UNI-PAK 21 TAB) 10 MG (21) TBPK tablet Take 10-60 mg by mouth daily with breakfast. Pt is to take as a taper:  Six tablets on day 1, five tablets on day 2, four tablets on  day 3, three tablets on day 4, two tablets on day 5, one tablet on day 6, then STOP. 06/13/15 06/19/15 Yes Historical Provider, MD  theophylline (THEO-24) 200 MG 24 hr capsule Take 200 mg by mouth daily.   Yes Historical Provider, MD      VITAL SIGNS:  Blood pressure 86/44, pulse 80, temperature 98.4 F (36.9 C), temperature source Oral, resp. rate 36, height 5\' 5"  (1.651 m), weight 59.875 kg (132 lb), SpO2 96 %.  PHYSICAL EXAMINATION:   Physical Exam  GENERAL:  80 y.o.-year-old patient lying in the bed with no acute distress.  EYES: Pupils equal, round, reactive to light and accommodation. No scleral icterus. Extraocular muscles intact.  HEENT: Head atraumatic, normocephalic. Oropharynx and nasopharynx clear.  NECK:  Supple, no jugular venous distention. No thyroid enlargement, no tenderness.  LUNGS: Diffuse coarse wheezing, No rales,rhonchi or crepitation. No use of accessory muscles of respiration.  CARDIOVASCULAR: S1, S2 normal. No rubs, or gallops. 3/6 systolic murmur present. ABDOMEN: Soft, nontender,  nondistended. Bowel sounds present. No organomegaly or mass.  EXTREMITIES: No pedal edema, cyanosis, or clubbing.  NEUROLOGIC: Cranial nerves II through XII are intact. Muscle strength 5/5 in all extremities. Sensation intact. Gait not checked.  PSYCHIATRIC: The patient is alert and oriented x 3.  SKIN: No obvious rash, lesion, or ulcer.   LABORATORY PANEL:   CBC  Recent Labs Lab 06/17/15 1249  WBC 9.1  HGB 9.1*  HCT 28.3*  PLT 526*   ------------------------------------------------------------------------------------------------------------------  Chemistries   Recent Labs Lab 06/17/15 1249  NA 140  K 3.6  CL 98*  CO2 29  GLUCOSE 188*  BUN 20  CREATININE 1.07*  CALCIUM 9.1   ------------------------------------------------------------------------------------------------------------------  Cardiac Enzymes  Recent Labs Lab 06/17/15 1249  TROPONINI <0.03   ------------------------------------------------------------------------------------------------------------------  RADIOLOGY:  Dg Chest 2 View  06/17/2015  CLINICAL DATA:  Chronic shortness of breath which has worsened today. Initial encounter. EXAM: CHEST  2 VIEW COMPARISON:  Single view of the chest 06/08/2015 and 06/05/2015. CT chest 03/05/2015. FINDINGS: The lungs are emphysematous with stroke linear basilar atelectasis or scar. No pneumothorax or pleural effusion. T11, T12 and L1 compression fractures are unchanged since the prior CT. IMPRESSION: Emphysema without acute disease. Electronically Signed   By: Drusilla Kanner M.D.   On: 06/17/2015 13:17   Ct Angio Chest Pe W/cm &/or Wo Cm  06/17/2015  CLINICAL DATA:  Left shoulder pain and shortness of breath for the past 3 days. Coronary artery disease with stent placement. Ex-smoker. EXAM: CT ANGIOGRAPHY CHEST WITH CONTRAST TECHNIQUE: Multidetector CT imaging of the chest was performed using the standard protocol during bolus administration of intravenous  contrast. Multiplanar CT image reconstructions and MIPs were obtained to evaluate the vascular anatomy. CONTRAST:  60 cc Isovue 370 COMPARISON:  Chest radiographs obtained earlier today and chest CT dated 03/05/2015. Abdomen CT dated 02/09/2014. FINDINGS: Mediastinum/Lymph Nodes: Normally opacified pulmonary arteries with no pulmonary arterial filling defects seen. Atheromatous coronary artery calcifications. No enlarged lymph nodes. Lungs/Pleura: Biapical pleural and parenchymal scarring. Interval tree in bud opacities in the right lower lobe. Interval similar changes, to a lesser degree, in the left lower lobe. The previously demonstrated 5 mm irregular density in the left lower lobe is 7 mm in diameter on image number 75, previously 7 mm in corresponding diameter. Upper abdomen: Stable low density right adrenal nodule. This measures 4.0 x 2.0 cm on image number 120 of series 4 and previously measured -5 Hounsfield units in density on the  noncontrast study dated 03/05/2015. A similar appearing in similar density nodule in the left adrenal gland is also unchanged, measuring 2.9 x 1.3 cm on image number 132 of series 4. These have also not changed significantly since 02/09/2014. Musculoskeletal: No significant change in multiple vertebral compression deformities without acute fracture lines. Mild bony retropulsion at the T12 level is unchanged. Review of the MIP images confirms the above findings. IMPRESSION: 1. Interval the tree in bud opacities in both lower lobes, greater on the right. These are concerning for active infection. 2. No pulmonary emboli. 3. Stable 7 mm irregular nodule in the left lower lobe. Non-contrast chest CT at 6 months is recommended. If the nodules are stable at time of repeat CT, then future CT at 18-24 months (from today's scan) is considered optional for low-risk patients, but is recommended for high-risk patients. This recommendation follows the consensus statement: Guidelines for  Management of Incidental Pulmonary Nodules Detected on CT Images:From the Fleischner Society 2017; published online before print (10.1148/[phone] [phone]). 4. Stable bilateral adrenal adenomas. Electronically Signed   By: Beckie SaltsSteven  Reid M.D.   On: 06/17/2015 15:06   Dg Shoulder Left  06/17/2015  CLINICAL DATA:  Shoulder pain for 2 days without known trauma. EXAM: LEFT SHOULDER - 2+ VIEW COMPARISON:  None. FINDINGS: No fracture identified. There are degenerative changes at the [facility] joint. The patient could not abduct her are precluding an axillary view. As a result, evaluation for dislocation is limited but none is seen. IMPRESSION: Negative. Electronically Signed   By: Gerome Samavid  Williams III M.D   On: 06/17/2015 14:14    EKG:   Orders placed or performed during the hospital encounter of 06/17/15  . EKG 12-Lead  . EKG 12-Lead  . ED EKG  . ED EKG    IMPRESSION AND PLAN:   Celestia KhatMary Vanamburg  is a 80 y.o. female with a known history of CAD status post stent, COPD on 2 L home oxygen, tobacco use disorder, congestive heart failure with diastolic dysfunction, hypothyroidism presents to the hospital secondary to worsening shortness of breath.  #1 COPD exacerbation and healthcare acquired pneumonia-admit, off in a telemetry. -Follow blood cultures. Continue Solu-Medrol, duo nebs, inhalers -Started on vancomycin and Zosyn. -Pulmonary consult. -Patient states that she just has quit smoking. Continue nicotine supplements as needed. -Palliative care consult for recurrent hospitalizations. -will need physical therapy consult as well.  #2 recent admission for GI bleed-received 2 units packed RBC transfusion last admission last week. Hemoglobin is stable at 9.1 today. -Continue soft diet at this time. Outpatient GI follow-up if no acute needs. -No active bleeding at this time.  #3 CAD status post stent-appears stable. Off in a telemetry. Denies any chest pain. -Recycle troponins.  #4 GERD-continue  Protonix  #5 hypothyroidism-continue Synthroid  #6 DVT prophylaxis-on Lovenox    All the records are reviewed and case discussed with ED provider. Management plans discussed with the patient, family and they are in agreement.  CODE STATUS: Full Code  TOTAL TIME TAKING CARE OF THIS PATIENT: 50 minutes.    Enid BaasKALISETTI,Jahna Liebert M.D on 06/17/2015 at 4:05 PM  Between 7am to 6pm - Pager - [phone]  After 6pm go to [email or web address] - password [email or web address] [facility]  [facility]  Office  [phone]  CC: Primary care physician; Lorie PhenixNancy Maloney, MD

## 2015-06-17 NOTE — ED Notes (Signed)
Pt here with c/o sob worsening today, and left shoulder pain, is on 2L O2 at home continuously, sats 95% on 2L, pt has pursed lip breathing, states she feels "awful." Was released from [facility] last Thursday for the same. Daughter states left shoulder pain is most likely due to her osteoarthritis.

## 2015-06-18 LAB — CBC
HEMATOCRIT: 23.9 % — AB (ref 35.0–47.0)
HEMOGLOBIN: 7.9 g/dL — AB (ref 12.0–16.0)
MCH: 26 pg (ref 26.0–34.0)
MCHC: 33.1 g/dL (ref 32.0–36.0)
MCV: 78.4 fL — AB (ref 80.0–100.0)
Platelets: 494 10*3/uL — ABNORMAL HIGH (ref 150–440)
RBC: 3.05 MIL/uL — AB (ref 3.80–5.20)
RDW: 16.7 % — ABNORMAL HIGH (ref 11.5–14.5)
WBC: 8.9 10*3/uL (ref 3.6–11.0)

## 2015-06-18 LAB — BASIC METABOLIC PANEL
Anion gap: 7 (ref 5–15)
BUN: 19 mg/dL (ref 6–20)
CHLORIDE: 105 mmol/L (ref 101–111)
CO2: 29 mmol/L (ref 22–32)
Calcium: 8.6 mg/dL — ABNORMAL LOW (ref 8.9–10.3)
Creatinine, Ser: 0.82 mg/dL (ref 0.44–1.00)
GFR calc non Af Amer: >60 mL/min (ref >60)
Glucose, Bld: 148 mg/dL — ABNORMAL HIGH (ref 65–99)
POTASSIUM: 3.8 mmol/L (ref 3.5–5.1)
SODIUM: 141 mmol/L (ref 135–145)

## 2015-06-18 LAB — TROPONIN I

## 2015-06-18 LAB — URINALYSIS COMPLETE WITH MICROSCOPIC (ARMC ONLY)
BILIRUBIN URINE: NEGATIVE
Bacteria, UA: NONE SEEN
GLUCOSE, UA: NEGATIVE mg/dL
KETONES UR: NEGATIVE mg/dL
Leukocytes, UA: NEGATIVE
NITRITE: NEGATIVE
Protein, ur: NEGATIVE mg/dL
SPECIFIC GRAVITY, URINE: 1.017 (ref 1.005–1.030)
pH: 7 (ref 5.0–8.0)

## 2015-06-18 LAB — BLOOD GAS, VENOUS
ACID-BASE EXCESS: 11.6 mmol/L — AB (ref 0.0–3.0)
BICARBONATE: 37.6 meq/L — AB (ref 21.0–28.0)
FIO2: 28
Patient temperature: 37
pCO2, Ven: 58 mmHg (ref 44.0–60.0)
pH, Ven: 7.42 (ref 7.320–7.430)
pO2, Ven: <31 mmHg — ABNORMAL LOW (ref 31.0–45.0)

## 2015-06-18 LAB — LACTIC ACID, PLASMA: Lactic Acid, Venous: 1.8 mmol/L (ref 0.5–2.0)

## 2015-06-18 MED ORDER — PIPERACILLIN-TAZOBACTAM 3.375 G IVPB
3.3750 g | Freq: Three times a day (TID) | INTRAVENOUS | Status: DC
  Administered 2015-06-18 – 2015-06-20 (×7): 3.375 g via INTRAVENOUS
  Filled 2015-06-18 (×11): qty 50

## 2015-06-18 MED ORDER — VANCOMYCIN HCL IN DEXTROSE 1-5 GM/200ML-% IV SOLN
1000.0000 mg | INTRAVENOUS | Status: DC
  Administered 2015-06-18 – 2015-06-19 (×2): 1000 mg via INTRAVENOUS
  Filled 2015-06-18 (×3): qty 200

## 2015-06-18 NOTE — Progress Notes (Signed)
Pharmacy Antibiotic Note  Rhea PinkMary L Dimiceli is a 80 y.o. female admitted on 06/17/2015 with pneumonia (HCAP).  Pharmacy has been consulted for Zosyn and vancomycin dosing.  Plan: Pt received vanc 1250 mg IV x1 and Zosyn 3.375 g IV x1 on 6/5. Vancomycin 1000 mg IV every 18 hours.  Goal trough 15-20 mcg/mL. Zosyn 3.375g IV q8h (4 hour infusion).   Vancomycin trough before 4th dose - 6/8 at 1600 Ke 0.047, half life 14.8 h, Vd 42 L   Height: 5\' 5"  (165.1 cm) Weight: 132 lb (59.875 kg) IBW/kg (Calculated) : 57  Temp (24hrs), Avg:98 F (36.7 C), Min:97.7 F (36.5 C), Max:98.4 F (36.9 C)   Recent Labs Lab 06/12/15 0134 06/17/15 1249 06/17/15 1517 06/17/15 1838 06/18/15 0545  WBC  --  9.1  --   --  8.9  CREATININE 0.82 1.07*  --   --  0.82  LATICACIDVEN  --   --  1.2 3.5* 1.8    Estimated Creatinine Clearance: 50.9 mL/min (by C-G formula based on Cr of 0.82).    Allergies  Allergen Reactions  . Codeine Nausea And Vomiting  . Fluticasone-Salmeterol Other (See Comments)    Reaction:  Unknown   . Levofloxacin Other (See Comments)    Reaction:  Confusion and hallucinations   . Tiotropium Bromide Monohydrate Rash    Antimicrobials this admission: Vancomycin 6/5 >>  Zosyn 6/5 >>   Dose adjustments this admission:   Microbiology results: 6/5 BCx: sent 6/5 UCx: sent    Thank you for allowing pharmacy to be a part of this patient's care.  Marty HeckWang, Waller Marcussen L 06/18/2015 8:44 AM

## 2015-06-18 NOTE — Care Management (Signed)
Patient readmit. Lives at home alone.  Adult daughter lives locally for support.  [facility]  Patient is on 2 liters Chronic O2 and states that her daughter will bring her portable O2 tank at discharge.  Patient was set up with home health [facility] services through [facility] last admission, but first visit was not made due to [facility].  Patient states "i went there because I felt like I.couldn't breathe".  PT has assessed patient and does not recommend and PT followup. Will need resumption of home health order at time of discharge.  Barbara CowerJason with [facility] notified of admission.

## 2015-06-18 NOTE — Consult Note (Signed)
Pulmonary Critical Care  Initial Consult Note   Samantha Goodman:[id number] DOB: 14-Jan-1935 DOA: 06/17/2015  Referring physician: Enid Baas, MD PCP: Lorie Phenix, MD   Chief Complaint: SOB  HPI: Samantha Goodman is a 80 y.o. female with severe advanced COPD presented with increased SOB. Patient had a diagnosis of HCAP made and has been on treatment as such. She has been slow to improve. Patient states that today she is feeling somewhat better and has less dyspnea. She has no chest pain noted. CT of the chest shows that she has a tree in bud appearance. This may be related to current infection but could also be due to MAI.   Review of Systems:  Constitutional:  No weight loss, night sweats, Fevers, chills, +fatigue.  HEENT:  No headaches, nasal congestion, post nasal drip,  Cardio-vascular:  No chest pain, dizziness, palpitations  GI:  No heartburn, indigestion, abdominal pain, nausea, vomiting, diarrhea  Resp:  +shortness of breath +productive cough, +wheezing Skin:  no rash or lesions.  Musculoskeletal:  No joint pain or swelling.   Remainder ROS performed and is unremarkable other than noted in HPI  Past Medical History  Diagnosis Date  . COPD (chronic obstructive pulmonary disease) (HCC)   . Asthma   . CAD (coronary artery disease)   . Unstable angina pectoris (HCC)   . Hyperlipemia   . Agitation   . Fall   . Hypothyroid   . CHF (congestive heart failure) (HCC)     Diastolic CHF, EF 81%   Past Surgical History  Procedure Laterality Date  . Eye surgery    . Abdominal hysterectomy    . Appendectomy    . Ankle surgery Right    Social History:  reports that she quit smoking about 19 months ago. She has never used smokeless tobacco. She reports that she does not drink alcohol or use illicit drugs.  Allergies  Allergen Reactions  . Codeine Nausea And Vomiting  . Fluticasone-Salmeterol Other (See Comments)    Reaction:  Unknown   . Levofloxacin Other (See  Comments)    Reaction:  Confusion and hallucinations   . Tiotropium Bromide Monohydrate Rash    Family History  Problem Relation Age of Onset  . Stroke Mother   . Prostate cancer Father   . CAD Brother   . Ovarian cancer Sister   . Breast cancer Sister   . Lung cancer Sister     Prior to Admission medications   Medication Sig Start Date End Date Taking? Authorizing Provider  acetaminophen (TYLENOL) 500 MG tablet Take 500-1,000 mg by mouth every 6 (six) hours as needed for mild pain, fever or headache.    Yes Historical Provider, MD  albuterol (PROVENTIL HFA;VENTOLIN HFA) 108 (90 Base) MCG/ACT inhaler Inhale 2 puffs into the lungs every 4 (four) hours as needed for wheezing or shortness of breath.   Yes Historical Provider, MD  atorvastatin (LIPITOR) 40 MG tablet Take 40 mg by mouth at bedtime.   Yes Historical Provider, MD  budesonide-formoterol (SYMBICORT) 80-4.5 MCG/ACT inhaler Inhale 2 puffs into the lungs 2 (two) times daily.   Yes Historical Provider, MD  cholecalciferol (VITAMIN D) 1000 UNITS tablet Take 1,000 Units by mouth daily.   Yes Historical Provider, MD  docusate sodium (COLACE) 250 MG capsule Take 250 mg by mouth daily.   Yes Historical Provider, MD  doxycycline (VIBRA-TABS) 100 MG tablet Take 1 tablet (100 mg total) by mouth every 12 (twelve) hours. 06/10/15  Yes Rima  Winona LegatoVaickute, MD  ferrous sulfate 325 (65 FE) MG tablet Take 325 mg by mouth daily with supper.   Yes Historical Provider, MD  fluticasone (FLONASE) 50 MCG/ACT nasal spray Place 2 sprays into both nostrils daily as needed for rhinitis.   Yes Historical Provider, MD  furosemide (LASIX) 20 MG tablet Take 1 tablet (20 mg total) by mouth daily. 10/16/14  Yes Lorie PhenixNancy Maloney, MD  guaiFENesin (MUCINEX) 600 MG 12 hr tablet Take 1 tablet (600 mg total) by mouth 2 (two) times daily. 11/17/14  Yes Gale Journeyatherine P Walsh, MD  ipratropium (ATROVENT HFA) 17 MCG/ACT inhaler Inhale 2 puffs into the lungs every 6 (six) hours as needed for  wheezing.   Yes Historical Provider, MD  ipratropium-albuterol (DUONEB) 0.5-2.5 (3) MG/3ML SOLN Take 3 mLs by nebulization 4 (four) times daily as needed (for wheezing/shortness of breath).    Yes Historical Provider, MD  levothyroxine (SYNTHROID, LEVOTHROID) 88 MCG tablet Take 88 mcg by mouth daily before breakfast.   Yes Historical Provider, MD  metoprolol succinate (TOPROL-XL) 25 MG 24 hr tablet Take 12.5 mg by mouth daily.   Yes Historical Provider, MD  montelukast (SINGULAIR) 10 MG tablet Take 10 mg by mouth at bedtime.   Yes Historical Provider, MD  nicotine polacrilex (RA NICOTINE POLACRILEX) 4 MG gum Take 4 mg by mouth as needed for smoking cessation.    Yes Historical Provider, MD  pantoprazole (PROTONIX) 40 MG tablet Take 1 tablet (40 mg total) by mouth 2 (two) times daily. 06/13/15  Yes Katharina Caperima Vaickute, MD  polyethylene glycol (MIRALAX / GLYCOLAX) packet Take 17 g by mouth daily.   Yes Historical Provider, MD  potassium chloride SA (K-DUR,KLOR-CON) 20 MEQ tablet Take 1 tablet (20 mEq total) by mouth daily. 06/10/15  Yes Katharina Caperima Vaickute, MD  predniSONE (STERAPRED UNI-PAK 21 TAB) 10 MG (21) TBPK tablet Take 10-60 mg by mouth daily with breakfast. Pt is to take as a taper:  Six tablets on day 1, five tablets on day 2, four tablets on day 3, three tablets on day 4, two tablets on day 5, one tablet on day 6, then STOP. 06/13/15 06/19/15 Yes Historical Provider, MD  theophylline (THEO-24) 200 MG 24 hr capsule Take 200 mg by mouth daily.   Yes Historical Provider, MD   Physical Exam: Filed Vitals:   06/18/15 0725 06/18/15 1021 06/18/15 1239 06/18/15 1240  BP:  105/38 108/48 106/49  Pulse:  70 80 78  Temp:   97.9 F (36.6 C)   TempSrc:   Oral   Resp:   18   Height:      Weight:      SpO2: 93%  96%     Wt Readings from Last 3 Encounters:  06/17/15 59.875 kg (132 lb)  06/05/15 68.04 kg (150 lb)  05/31/15 63.957 kg (141 lb)    General:  Appears calm and comfortable Eyes: PERRL, normal lids,  irises & conjunctiva ENT: grossly normal hearing, lips & tongue Neck: no LAD, masses or thyromegaly Cardiovascular: RRR, no m/r/g. No LE edema. Respiratory: +rhonchi few creps noted. Normal respiratory effort. Abdomen: soft, nontender Skin: no rash or induration seen on limited exam Musculoskeletal: grossly normal tone BUE/BLE Psychiatric: grossly normal mood and affect Neurologic: grossly non-focal.          Labs on Admission:  Basic Metabolic Panel:  Recent Labs Lab 06/12/15 0134 06/17/15 1249 06/18/15 0545  NA 141 140 141  K 3.7 3.6 3.8  CL 102 98* 105  CO2 33*  29 29  GLUCOSE 84 188* 148*  BUN 23* 20 19  CREATININE 0.82 1.07* 0.82  CALCIUM 8.8* 9.1 8.6*   Liver Function Tests: No results for input(s): AST, ALT, ALKPHOS, BILITOT, PROT, ALBUMIN in the last 168 hours. No results for input(s): LIPASE, AMYLASE in the last 168 hours. No results for input(s): AMMONIA in the last 168 hours. CBC:  Recent Labs Lab 06/12/15 0907 06/12/15 1751 06/13/15 0806 06/17/15 1249 06/18/15 0545  WBC  --   --   --  9.1 8.9  HGB 8.2* 8.2* 8.2* 9.1* 7.9*  HCT  --   --   --  28.3* 23.9*  MCV  --   --   --  79.8* 78.4*  PLT  --   --   --  526* 494*   Cardiac Enzymes:  Recent Labs Lab 06/17/15 1249 06/17/15 1838 06/17/15 2357 06/18/15 0545  TROPONINI <0.03 <0.03 <0.03 <0.03    BNP (last 3 results)  Recent Labs  06/05/15 0813 06/17/15 1249  BNP 36.0 37.0    ProBNP (last 3 results) No results for input(s): PROBNP in the last 8760 hours.  CBG: No results for input(s): GLUCAP in the last 168 hours.  Radiological Exams on Admission: Dg Chest 2 View  06/17/2015  CLINICAL DATA:  Chronic shortness of breath which has worsened today. Initial encounter. EXAM: CHEST  2 VIEW COMPARISON:  Single view of the chest 06/08/2015 and 06/05/2015. CT chest 03/05/2015. FINDINGS: The lungs are emphysematous with stroke linear basilar atelectasis or scar. No pneumothorax or pleural  effusion. T11, T12 and L1 compression fractures are unchanged since the prior CT. IMPRESSION: Emphysema without acute disease. Electronically Signed   By: Drusilla Kanner M.D.   On: 06/17/2015 13:17   Ct Angio Chest Pe W/cm &/or Wo Cm  06/17/2015  CLINICAL DATA:  Left shoulder pain and shortness of breath for the past 3 days. Coronary artery disease with stent placement. Ex-smoker. EXAM: CT ANGIOGRAPHY CHEST WITH CONTRAST TECHNIQUE: Multidetector CT imaging of the chest was performed using the standard protocol during bolus administration of intravenous contrast. Multiplanar CT image reconstructions and MIPs were obtained to evaluate the vascular anatomy. CONTRAST:  60 cc Isovue 370 COMPARISON:  Chest radiographs obtained earlier today and chest CT dated 03/05/2015. Abdomen CT dated 02/09/2014. FINDINGS: Mediastinum/Lymph Nodes: Normally opacified pulmonary arteries with no pulmonary arterial filling defects seen. Atheromatous coronary artery calcifications. No enlarged lymph nodes. Lungs/Pleura: Biapical pleural and parenchymal scarring. Interval tree in bud opacities in the right lower lobe. Interval similar changes, to a lesser degree, in the left lower lobe. The previously demonstrated 5 mm irregular density in the left lower lobe is 7 mm in diameter on image number 75, previously 7 mm in corresponding diameter. Upper abdomen: Stable low density right adrenal nodule. This measures 4.0 x 2.0 cm on image number 120 of series 4 and previously measured -5 Hounsfield units in density on the noncontrast study dated 03/05/2015. A similar appearing in similar density nodule in the left adrenal gland is also unchanged, measuring 2.9 x 1.3 cm on image number 132 of series 4. These have also not changed significantly since 02/09/2014. Musculoskeletal: No significant change in multiple vertebral compression deformities without acute fracture lines. Mild bony retropulsion at the T12 level is unchanged. Review of the MIP  images confirms the above findings. IMPRESSION: 1. Interval the tree in bud opacities in both lower lobes, greater on the right. These are concerning for active infection. 2. No pulmonary emboli. 3.  Stable 7 mm irregular nodule in the left lower lobe. Non-contrast chest CT at 6 months is recommended. If the nodules are stable at time of repeat CT, then future CT at 18-24 months (from today's scan) is considered optional for low-risk patients, but is recommended for high-risk patients. This recommendation follows the consensus statement: Guidelines for Management of Incidental Pulmonary Nodules Detected on CT Images:From the Fleischner Society 2017; published online before print (10.1148/radiol.[id number]). 4. Stable bilateral adrenal adenomas. Electronically Signed   By: Beckie Salts M.D.   On: 06/17/2015 15:06   Dg Shoulder Left  06/17/2015  CLINICAL DATA:  Shoulder pain for 2 days without known trauma. EXAM: LEFT SHOULDER - 2+ VIEW COMPARISON:  None. FINDINGS: No fracture identified. There are degenerative changes at the [facility] joint. The patient could not abduct her are precluding an axillary view. As a result, evaluation for dislocation is limited but none is seen. IMPRESSION: Negative. Electronically Signed   By: Gerome Sam III M.D   On: 06/17/2015 14:14    EKG: Independently reviewed.  Assessment/Plan Active Problems:   HCAP (healthcare-associated pneumonia)   1. Acute on Chronic respiratory failure with hypoxia -she states that she is feeling somewhat better. She has been doing better with her oxygenation -continue with oxygen therapy -ABG as needed -maintain sao2>88-90%  2. COPD with exacerbation -continue with steroids as you are -agree with her inhaler regimen -she has severe advanced disease so will be slow to improve  3. HCAP -she is on vanc and zosyn -follow xrays to resolution -CT suggestive of possible MAI   Full Code  Time spent:    I have personally obtained  a history, examined the patient, evaluated laboratory and imaging results, formulated the assessment and plan and placed orders.  The Patient requires high complexity decision making for assessment and support.    Yevonne Pax, MD [facility] Pulmonary Critical Care Medicine Sleep Medicine

## 2015-06-18 NOTE — Evaluation (Signed)
Physical Therapy Evaluation Patient Details Name: Samantha Goodman MRN: [id number] DOB: 12-Apr-1935 Today's Date: 06/18/2015   History of Present Illness  Samantha Goodman  is a 80 y.o. female with a known history of CAD status post stent, COPD on 2 L home oxygen, tobacco use disorder, congestive heart failure with diastolic dysfunction, hypothyroidism presents to the hospital secondary to worsening shortness of breath. Patient was in the hospital last week and just got discharged 4 days ago after she was admitted for GI bleed. Her aspirin and Plavix were discontinued at the time. She did receive transfusion during her last hospitalization. There is a concern if patient has been compliant with her medications at home. However today she got discharged, she had more shortness of breath and went to [facility] and was admitted there and was discharged on doxycycline and prednisone taper for COPD and bronchitis. She states her symptoms haven't improved abdominal continues to feel short of breath and presented to the hospital. CT of the chest here reveals no pulmonary emboli, has COPD and also right lower lobe infection. She is being admitted for healthcare acquired pneumonia and COPD exacerbation. She continues to wheeze but requiring only 2 L oxygen at this time. No reported falls in the last 12 months  Clinical Impression  Pt is independent with all bed mobility, transfers, and ambulation. Gait speed and stability are [facility] without notable deficits. Pt able to ambulate safely without an assistive device. Able to perform head turns and gait speed changes without notable deviation. SaO2 remains WNL on 2L/min O2 during entire ambulation distance. Pt wears 2L/min continuous O2 at home at baseline. No further PT needs identified at this time. Order will be completed. Pt encouraged to ambulate with staff to prevent deconditioning during admission.   Follow Up Recommendations No PT follow up    Equipment Recommendations  None  recommended by PT    Recommendations for Other Services       Precautions / Restrictions Precautions Precautions: Fall Restrictions Weight Bearing Restrictions: No      Mobility  Bed Mobility Overal bed mobility: Independent             General bed mobility comments: Good speed and sequencing  Transfers Overall transfer level: Independent Equipment used: None             General transfer comment: Good speed, sequencing, and stability. Good stability in standing without UE support  Ambulation/Gait Ambulation/Gait assistance: Independent Ambulation Distance (Feet): 250 Feet Assistive device: None Gait Pattern/deviations: WFL(Within Functional Limits) Gait velocity: WFL Gait velocity interpretation: at or above normal speed for age/gender General Gait Details: Pt demonstrates good speed and gait quality during ambulation. Pt on 2L/min O2 during ambulation and SaO2 remains at or above 95%. Pt denies worsening SOB from rest. No deficits noted  Stairs            Wheelchair Mobility    Modified Rankin (Stroke Patients Only)       Balance Overall balance assessment: Independent                                           Pertinent Vitals/Pain Pain Assessment: No/denies pain (reports minimal SOB at rest)    Home Living Family/patient expects to be discharged to:: Private residence Living Arrangements: Alone Available Help at Discharge: Family;Available PRN/intermittently Type of Home: Apartment Home Access: Level entry  Home Layout: One level Home Equipment: Toilet riser;Other (comment) (No cane or walker. No BSC. Refuses shower chair) Additional Comments: Home O2 (2L continuous)     Prior Function Level of Independence: Independent         Comments: Indep with household and community mobility without assist device; + driving; denies fall history.     Hand Dominance   Dominant Hand: Right    Extremity/Trunk  Assessment   Upper Extremity Assessment: Overall WFL for tasks assessed           Lower Extremity Assessment: Overall WFL for tasks assessed         Communication   Communication: HOH  Cognition Arousal/Alertness: Awake/alert Behavior During Therapy: WFL for tasks assessed/performed Overall Cognitive Status: Within Functional Limits for tasks assessed                      General Comments General comments (skin integrity, edema, etc.): Mild SOB at rest    Exercises        Assessment/Plan    PT Assessment Patent does not need any further PT services  PT Diagnosis     PT Problem List    PT Treatment Interventions     PT Goals (Current goals can be found in the Care Plan section) Acute Rehab PT Goals PT Goal Formulation: All assessment and education complete, DC therapy    Frequency     Barriers to discharge        Co-evaluation               End of Session Equipment Utilized During Treatment: Gait belt;Oxygen;Other (comment) (2 L/min) Activity Tolerance: Patient tolerated treatment well Patient left: in bed;with call bell/phone within reach;with bed alarm set;Other (comment) (Refuses up to recliner) Nurse Communication: Mobility status         Time: [id number] PT Time Calculation (min) (ACUTE ONLY): 25 min   Charges:   PT Evaluation $PT Eval Low Complexity: 1 Procedure     PT G Codes:       Sharalyn InkJason D Jennings Corado PT, DPT   Samantha Goodman 06/18/2015, 1:33 PM

## 2015-06-18 NOTE — Progress Notes (Signed)
[facility] - [place] [facility]   PATIENT NAME: Samantha Goodman    MR#:  [id number]  DATE OF BIRTH:  11/02/1935  SUBJECTIVE:  CHIEF COMPLAINT:   Chief Complaint  Patient presents with  . Shortness of Breath  . Shoulder Pain   - Feels some better, still has wheezing and some dyspnea - hb slow drop noted.  REVIEW OF SYSTEMS:  Review of Systems  Constitutional: Positive for malaise/fatigue. Negative for fever and chills.  HENT: Negative for ear discharge, ear pain and nosebleeds.   Eyes: Negative for blurred vision and double vision.  Respiratory: Positive for shortness of breath and wheezing. Negative for cough.   Cardiovascular: Negative for chest pain and palpitations.  Gastrointestinal: Negative for nausea, vomiting, abdominal pain, diarrhea and constipation.  Genitourinary: Negative for dysuria and urgency.  Musculoskeletal: Negative for myalgias and neck pain.  Neurological: Negative for dizziness, seizures and headaches.  Psychiatric/Behavioral: Negative for depression.    DRUG ALLERGIES:   Allergies  Allergen Reactions  . Codeine Nausea And Vomiting  . Fluticasone-Salmeterol Other (See Comments)    Reaction:  Unknown   . Levofloxacin Other (See Comments)    Reaction:  Confusion and hallucinations   . Tiotropium Bromide Monohydrate Rash    VITALS:  Blood pressure 105/38, pulse 70, temperature 97.7 F (36.5 C), temperature source Oral, resp. rate 24, height 5\' 5"  (1.651 m), weight 59.875 kg (132 lb), SpO2 93 %.  PHYSICAL EXAMINATION:  Physical Exam  GENERAL: 80 y.o.-year-old patient lying in the bed with no acute distress.  EYES: Pupils equal, round, reactive to light and accommodation. No scleral icterus. Extraocular muscles intact.  HEENT: Head atraumatic, normocephalic. Oropharynx and nasopharynx clear.  NECK: Supple, no jugular venous distention. No thyroid enlargement, no tenderness.  LUNGS: Diffuse coarse wheezing, No  rales,rhonchi or crepitation. No use of accessory muscles of respiration.  CARDIOVASCULAR: S1, S2 normal. No rubs, or gallops. 3/6 systolic murmur present. ABDOMEN: Soft, nontender, nondistended. Bowel sounds present. No organomegaly or mass.  EXTREMITIES: No pedal edema, cyanosis, or clubbing.  NEUROLOGIC: Cranial nerves II through XII are intact. Muscle strength 5/5 in all extremities. Sensation intact. Gait not checked.  PSYCHIATRIC: The patient is alert and oriented x 3.  SKIN: No obvious rash, lesion, or ulcer.   LABORATORY PANEL:   CBC  Recent Labs Lab 06/18/15 0545  WBC 8.9  HGB 7.9*  HCT 23.9*  PLT 494*   ------------------------------------------------------------------------------------------------------------------  Chemistries   Recent Labs Lab 06/18/15 0545  NA 141  K 3.8  CL 105  CO2 29  GLUCOSE 148*  BUN 19  CREATININE 0.82  CALCIUM 8.6*   ------------------------------------------------------------------------------------------------------------------  Cardiac Enzymes  Recent Labs Lab 06/18/15 0545  TROPONINI <0.03   ------------------------------------------------------------------------------------------------------------------  RADIOLOGY:  Dg Chest 2 View  06/17/2015  CLINICAL DATA:  Chronic shortness of breath which has worsened today. Initial encounter. EXAM: CHEST  2 VIEW COMPARISON:  Single view of the chest 06/08/2015 and 06/05/2015. CT chest 03/05/2015. FINDINGS: The lungs are emphysematous with stroke linear basilar atelectasis or scar. No pneumothorax or pleural effusion. T11, T12 and L1 compression fractures are unchanged since the prior CT. IMPRESSION: Emphysema without acute disease. Electronically Signed   By: Drusilla Kannerhomas  Dalessio M.D.   On: 06/17/2015 13:17   Ct Angio Chest Pe W/cm &/or Wo Cm  06/17/2015  CLINICAL DATA:  Left shoulder pain and shortness of breath for the past 3 days. Coronary artery disease with stent placement.  Ex-smoker. EXAM: CT ANGIOGRAPHY CHEST  WITH CONTRAST TECHNIQUE: Multidetector CT imaging of the chest was performed using the standard protocol during bolus administration of intravenous contrast. Multiplanar CT image reconstructions and MIPs were obtained to evaluate the vascular anatomy. CONTRAST:  60 cc Isovue 370 COMPARISON:  Chest radiographs obtained earlier today and chest CT dated 03/05/2015. Abdomen CT dated 02/09/2014. FINDINGS: Mediastinum/Lymph Nodes: Normally opacified pulmonary arteries with no pulmonary arterial filling defects seen. Atheromatous coronary artery calcifications. No enlarged lymph nodes. Lungs/Pleura: Biapical pleural and parenchymal scarring. Interval tree in bud opacities in the right lower lobe. Interval similar changes, to a lesser degree, in the left lower lobe. The previously demonstrated 5 mm irregular density in the left lower lobe is 7 mm in diameter on image number 75, previously 7 mm in corresponding diameter. Upper abdomen: Stable low density right adrenal nodule. This measures 4.0 x 2.0 cm on image number 120 of series 4 and previously measured -5 Hounsfield units in density on the noncontrast study dated 03/05/2015. A similar appearing in similar density nodule in the left adrenal gland is also unchanged, measuring 2.9 x 1.3 cm on image number 132 of series 4. These have also not changed significantly since 02/09/2014. Musculoskeletal: No significant change in multiple vertebral compression deformities without acute fracture lines. Mild bony retropulsion at the T12 level is unchanged. Review of the MIP images confirms the above findings. IMPRESSION: 1. Interval the tree in bud opacities in both lower lobes, greater on the right. These are concerning for active infection. 2. No pulmonary emboli. 3. Stable 7 mm irregular nodule in the left lower lobe. Non-contrast chest CT at 6 months is recommended. If the nodules are stable at time of repeat CT, then future CT at 18-24  months (from today's scan) is considered optional for low-risk patients, but is recommended for high-risk patients. This recommendation follows the consensus statement: Guidelines for Management of Incidental Pulmonary Nodules Detected on CT Images:From the Fleischner Society 2017; published online before print (10.1148/radiol.1914782956). 4. Stable bilateral adrenal adenomas. Electronically Signed   By: Beckie Salts M.D.   On: 06/17/2015 15:06   Dg Shoulder Left  06/17/2015  CLINICAL DATA:  Shoulder pain for 2 days without known trauma. EXAM: LEFT SHOULDER - 2+ VIEW COMPARISON:  None. FINDINGS: No fracture identified. There are degenerative changes at the [facility] joint. The patient could not abduct her are precluding an axillary view. As a result, evaluation for dislocation is limited but none is seen. IMPRESSION: Negative. Electronically Signed   By: Gerome Sam III M.D   On: 06/17/2015 14:14    EKG:   Orders placed or performed during the hospital encounter of 06/17/15  . EKG 12-Lead  . EKG 12-Lead  . ED EKG  . ED EKG    ASSESSMENT AND PLAN:   Bennett Vanscyoc is a 80 y.o. female with a known history of CAD status post stent, COPD on 2 L home oxygen, tobacco use disorder, congestive heart failure with diastolic dysfunction, hypothyroidism presents to the hospital secondary to worsening shortness of breath.  #1 COPD exacerbation and healthcare acquired pneumonia- -Follow blood cultures. Continue Solu-Medrol, duo nebs, inhalers -on vancomycin and Zosyn. -Pulmonary consult. -Patient states that she just has quit smoking. Continue nicotine supplements as needed. -Palliative care consult for recurrent hospitalizations. - physical therapy consult as well.  #2 recent admission for GI bleed-received 2 units packed RBC transfusion last admission last week. Hemoglobin At discharge was 8.2. Today it is 7.9. Continue to monitor closely. -Continue soft diet at this time.  Outpatient GI follow-up if no  acute needs. -No active bleeding at this time.  #3 CAD status post stent-appears stable. Denies any chest pain. -negative troponins.  #4 GERD-continue Protonix  #5 hypothyroidism-continue Synthroid  #6 DVT prophylaxis-on Lovenox     All the records are reviewed and case discussed with Care Management/Social Workerr. Management plans discussed with the patient, family and they are in agreement.  CODE STATUS: Full Code  TOTAL TIME TAKING CARE OF THIS PATIENT: 37 minutes.   POSSIBLE D/C IN 2 DAYS, DEPENDING ON CLINICAL CONDITION.   Enid Baas M.D on 06/18/2015 at 12:01 PM  Between 7am to 6pm - Pager - [phone]  After 6pm go to [email or web address] - password [email or web address] [facility]  [place] [facility]  Office  [phone]  CC: Primary care physician; Lorie Phenix, MD

## 2015-06-19 ENCOUNTER — Inpatient Hospital Stay: Payer: Medicare Other | Admitting: Family Medicine

## 2015-06-19 LAB — URINE CULTURE: Culture: 9000 — AB

## 2015-06-19 LAB — CBC
HEMATOCRIT: 23.2 % — AB (ref 35.0–47.0)
Hemoglobin: 7.5 g/dL — ABNORMAL LOW (ref 12.0–16.0)
MCH: 25.2 pg — AB (ref 26.0–34.0)
MCHC: 32.3 g/dL (ref 32.0–36.0)
MCV: 78.1 fL — AB (ref 80.0–100.0)
Platelets: 467 10*3/uL — ABNORMAL HIGH (ref 150–440)
RBC: 2.98 MIL/uL — ABNORMAL LOW (ref 3.80–5.20)
RDW: 17.3 % — AB (ref 11.5–14.5)
WBC: 8.2 10*3/uL (ref 3.6–11.0)

## 2015-06-19 LAB — PREPARE RBC (CROSSMATCH)

## 2015-06-19 MED ORDER — SODIUM CHLORIDE 0.9 % IV SOLN
Freq: Once | INTRAVENOUS | Status: AC
  Administered 2015-06-19: 14:00:00 via INTRAVENOUS

## 2015-06-19 MED ORDER — MORPHINE SULFATE (CONCENTRATE) 10 MG/0.5ML PO SOLN
10.0000 mg | Freq: Once | ORAL | Status: AC
  Administered 2015-06-19: 10 mg via ORAL
  Filled 2015-06-19: qty 1

## 2015-06-19 NOTE — Care Management Important Message (Signed)
Important Message  Patient Details  Name: Samantha Goodman MRN: [id number] Date of Birth: 05/02/1935   Medicare Important Message Given:  Yes    Chapman FitchBOWEN, Juell Radney T, RN 06/19/2015, 3:27 PM

## 2015-06-19 NOTE — Care Management (Signed)
Per Dr. Nemiah CommanderKalisetti palliative consult placed to determine if patient would be appropriate  For Hospice services at time of discharge.  Patient is open with HRI RN services through [facility].  MD notified RNCM that patient's daughter would like information on additional resources for the home.  I have provided the patient with a list of PCS agencies.  Patient states "i just want to go home and be left alone ain't nobody else coming out to the house".  RNCM following for discharge disposition.

## 2015-06-19 NOTE — Progress Notes (Signed)
[facility] -  at [facility]   PATIENT NAME: Samantha Goodman    MR#:  [id number]  DATE OF BIRTH:  [birth year]  SUBJECTIVE:  CHIEF COMPLAINT:   Chief Complaint  Patient presents with  . Shortness of Breath  . Shoulder Pain   - Felt much better yesterday. Today she feels very dyspneic and couldn't breathe. Oxygen saturations are stable. Remains on 2 L nasal cannula. -Daughter at bedside. Recommended one dose of Roxanol and they're willing to try. Also suggested hospice services at discharge at home  REVIEW OF SYSTEMS:  Review of Systems  Constitutional: Positive for malaise/fatigue. Negative for fever and chills.  HENT: Negative for ear discharge, ear pain and nosebleeds.   Eyes: Negative for blurred vision and double vision.  Respiratory: Positive for shortness of breath and wheezing. Negative for cough.   Cardiovascular: Negative for chest pain and palpitations.  Gastrointestinal: Negative for nausea, vomiting, abdominal pain, diarrhea and constipation.  Genitourinary: Negative for dysuria and urgency.  Musculoskeletal: Negative for myalgias and neck pain.  Neurological: Negative for dizziness, seizures and headaches.  Psychiatric/Behavioral: Negative for depression. The patient is nervous/anxious.     DRUG ALLERGIES:   Allergies  Allergen Reactions  . Codeine Nausea And Vomiting  . Fluticasone-Salmeterol Other (See Comments)    Reaction:  Unknown   . Levofloxacin Other (See Comments)    Reaction:  Confusion and hallucinations   . Tiotropium Bromide Monohydrate Rash    VITALS:  Blood pressure 105/48, pulse 73, temperature 97.8 F (36.6 C), temperature source Oral, resp. rate 36, height 5\' 5"  (1.651 m), weight 59.875 kg (132 lb), SpO2 100 %.  PHYSICAL EXAMINATION:  Physical Exam  GENERAL: 80 y.o.-year-old patient lying in the bed with no acute distress.  EYES: Pupils equal, round, reactive to light and accommodation. No scleral icterus.  Extraocular muscles intact.  HEENT: Head atraumatic, normocephalic. Oropharynx and nasopharynx clear.  NECK: Supple, no jugular venous distention. No thyroid enlargement, no tenderness.  LUNGS: Diffuse coarse wheezing, No rales,rhonchi or crepitation. No use of accessory muscles of respiration. Fine bibasilar crackles. CARDIOVASCULAR: S1, S2 normal. No rubs, or gallops. 3/6 systolic murmur present. ABDOMEN: Soft, nontender, nondistended. Bowel sounds present. No organomegaly or mass.  EXTREMITIES: No pedal edema, cyanosis, or clubbing.  NEUROLOGIC: Cranial nerves II through XII are intact. Muscle strength 5/5 in all extremities. Sensation intact. Gait not checked.  PSYCHIATRIC: The patient is alert and oriented x 3.  SKIN: No obvious rash, lesion, or ulcer.   LABORATORY PANEL:   CBC  Recent Labs Lab 06/19/15 0438  WBC 8.2  HGB 7.5*  HCT 23.2*  PLT 467*   ------------------------------------------------------------------------------------------------------------------  Chemistries   Recent Labs Lab 06/18/15 0545  NA 141  K 3.8  CL 105  CO2 29  GLUCOSE 148*  BUN 19  CREATININE 0.82  CALCIUM 8.6*   ------------------------------------------------------------------------------------------------------------------  Cardiac Enzymes  Recent Labs Lab 06/18/15 0545  TROPONINI <0.03   ------------------------------------------------------------------------------------------------------------------  RADIOLOGY:  Ct Angio Chest Pe W/cm &/or Wo Cm  06/17/2015  CLINICAL DATA:  Left shoulder pain and shortness of breath for the past 3 days. Coronary artery disease with stent placement. Ex-smoker. EXAM: CT ANGIOGRAPHY CHEST WITH CONTRAST TECHNIQUE: Multidetector CT imaging of the chest was performed using the standard protocol during bolus administration of intravenous contrast. Multiplanar CT image reconstructions and MIPs were obtained to evaluate the vascular anatomy.  CONTRAST:  60 cc Isovue 370 COMPARISON:  Chest radiographs obtained earlier today and chest CT dated 03/05/2015.  Abdomen CT dated 02/09/2014. FINDINGS: Mediastinum/Lymph Nodes: Normally opacified pulmonary arteries with no pulmonary arterial filling defects seen. Atheromatous coronary artery calcifications. No enlarged lymph nodes. Lungs/Pleura: Biapical pleural and parenchymal scarring. Interval tree in bud opacities in the right lower lobe. Interval similar changes, to a lesser degree, in the left lower lobe. The previously demonstrated 5 mm irregular density in the left lower lobe is 7 mm in diameter on image number 75, previously 7 mm in corresponding diameter. Upper abdomen: Stable low density right adrenal nodule. This measures 4.0 x 2.0 cm on image number 120 of series 4 and previously measured -5 Hounsfield units in density on the noncontrast study dated 03/05/2015. A similar appearing in similar density nodule in the left adrenal gland is also unchanged, measuring 2.9 x 1.3 cm on image number 132 of series 4. These have also not changed significantly since 02/09/2014. Musculoskeletal: No significant change in multiple vertebral compression deformities without acute fracture lines. Mild bony retropulsion at the T12 level is unchanged. Review of the MIP images confirms the above findings. IMPRESSION: 1. Interval the tree in bud opacities in both lower lobes, greater on the right. These are concerning for active infection. 2. No pulmonary emboli. 3. Stable 7 mm irregular nodule in the left lower lobe. Non-contrast chest CT at 6 months is recommended. If the nodules are stable at time of repeat CT, then future CT at 18-24 months (from today's scan) is considered optional for low-risk patients, but is recommended for high-risk patients. This recommendation follows the consensus statement: Guidelines for Management of Incidental Pulmonary Nodules Detected on CT Images:From the Fleischner Society 2017; published  online before print (10.1148/radiol.4098119147). 4. Stable bilateral adrenal adenomas. Electronically Signed   By: Beckie Salts M.D.   On: 06/17/2015 15:06   Dg Shoulder Left  06/17/2015  CLINICAL DATA:  Shoulder pain for 2 days without known trauma. EXAM: LEFT SHOULDER - 2+ VIEW COMPARISON:  None. FINDINGS: No fracture identified. There are degenerative changes at the [facility] joint. The patient could not abduct her are precluding an axillary view. As a result, evaluation for dislocation is limited but none is seen. IMPRESSION: Negative. Electronically Signed   By: Gerome Sam III M.D   On: 06/17/2015 14:14    EKG:   Orders placed or performed during the hospital encounter of 06/17/15  . EKG 12-Lead  . EKG 12-Lead  . ED EKG  . ED EKG    ASSESSMENT AND PLAN:   Samantha Goodman is a 80 y.o. female with a known history of CAD status post stent, COPD on 2 L home oxygen, tobacco use disorder, congestive heart failure with diastolic dysfunction, hypothyroidism presents to the hospital secondary to worsening shortness of breath.  #1 COPD exacerbation and healthcare acquired pneumonia- -Negative blood cultures so far. Continue Solu-Medrol, duo nebs, inhalers -on vancomycin and Zosyn. Discontinue vancomycin today - Appreciate Pulmonary consult. -Patient states that she just has quit smoking. Continue nicotine supplements as needed. -Palliative care consult for recurrent hospitalizations. Also consider hospice at discharge -1 time dose of Roxanol to see improvement of dyspnea - physical therapy consulted  #2 recent admission for GI bleed- last week --received 2 units packed RBC transfusion last admission last week. Hemoglobin At discharge was 8.2. Today it is 7.5. Slow drop noted. Also associated with symptomatic dyspnea, will transfuse 1 unit today. -Continue soft diet at this time. Outpatient GI follow-up if no acute needs.  #3 CAD status post stent-appears stable. Denies any chest pain. -negative  troponins.  #4 GERD-continue Protonix  #5 hypothyroidism-continue Synthroid  #6 DVT prophylaxis-on Lovenox     All the records are reviewed and case discussed with Care Management/Social Workerr. Management plans discussed with the patient, family and they are in agreement.  CODE STATUS: Full Code  TOTAL TIME TAKING CARE OF THIS PATIENT: 37 minutes.   POSSIBLE D/C IN 2 DAYS, DEPENDING ON CLINICAL CONDITION.   Enid BaasKALISETTI,Quetzali Heinle M.D on 06/19/2015 at 1:29 PM  Between 7am to 6pm - Pager - [phone]  After 6pm go to [email or web address] - password [email or web address] [facility]  [facility]  Office  [phone]  CC: Primary care physician; Lorie PhenixNancy Maloney, MD

## 2015-06-20 ENCOUNTER — Telehealth: Payer: Self-pay | Admitting: Family Medicine

## 2015-06-20 ENCOUNTER — Ambulatory Visit: Payer: Medicare Other | Admitting: Family Medicine

## 2015-06-20 LAB — TYPE AND SCREEN
ABO/RH(D): B POS
Antibody Screen: NEGATIVE
UNIT DIVISION: 0

## 2015-06-20 LAB — HEMOGLOBIN: HEMOGLOBIN: 9.6 g/dL — AB (ref 12.0–16.0)

## 2015-06-20 MED ORDER — MORPHINE SULFATE (CONCENTRATE) 10 MG /0.5 ML PO SOLN: 10.0000 mg | ORAL | Status: DC | PRN

## 2015-06-20 MED ORDER — PREDNISONE 10 MG (21) PO TBPK: 10.0000 mg | ORAL_TABLET | Freq: Every day | ORAL | Status: DC

## 2015-06-20 MED ORDER — AMOXICILLIN-POT CLAVULANATE 875-125 MG PO TABS: 1.0000 | ORAL_TABLET | Freq: Two times a day (BID) | ORAL | Status: DC

## 2015-06-20 NOTE — Care Management (Signed)
Patient to discharge home today.  Daughter to bring portable tank for discharge.  Patient previously H[facility] with [facility].  MD felt that patient would benefit from Hospice services in the home.  Hospice preference agency list was offered. [facility].  Clydie BraunKaren with Hospice was notified.  Clydie BraunKaren screened patient and she is appropriate for hospice services.  HRI services to be discontinued with [facility].  Jason with [facility] Notified.

## 2015-06-20 NOTE — Telephone Encounter (Signed)
Pt is being discharged from [facility] today for pneumonia.  I have scheduled a hospital follow up appointment/MW °

## 2015-06-20 NOTE — Discharge Summary (Signed)
[facility] Physicians - [place] at [facility]   PATIENT NAME: Samantha Goodman    MR#:  [id number]  DATE OF BIRTH:  Jan 27, 1935  DATE OF ADMISSION:  06/17/2015 ADMITTING PHYSICIAN: Enid Baas, MD  DATE OF DISCHARGE:  06/20/2015  PRIMARY CARE PHYSICIAN: Lorie Phenix, MD    ADMISSION DIAGNOSIS:  Renal insufficiency [N28.9] Hospital-acquired pneumonia [J18.9] Sepsis, due to unspecified organism (HCC) [A41.9]  DISCHARGE DIAGNOSIS:  Active Problems:   HCAP (healthcare-associated pneumonia)   SECONDARY DIAGNOSIS:   Past Medical History  Diagnosis Date  . COPD (chronic obstructive pulmonary disease) (HCC)   . Asthma   . CAD (coronary artery disease)   . Unstable angina pectoris (HCC)   . Hyperlipemia   . Agitation   . Fall   . Hypothyroid   . CHF (congestive heart failure) (HCC)     Diastolic CHF, EF 62%    HOSPITAL COURSE:   Aerith Canal is a 80 y.o. female with a known history of CAD status post stent, COPD on 2 L home oxygen, tobacco use disorder, congestive heart failure with diastolic dysfunction, hypothyroidism presents to the hospital secondary to worsening shortness of breath.  #1 COPD exacerbation and healthcare acquired pneumonia- -Negative blood cultures so far. Continue steroids, duo nebs, inhalers -was on vancomycin and Zosyn. Change to augmentin at discharge - Appreciate Pulmonary consult. -Patient states that she just has quit smoking. Continue nicotine supplements as needed. -Palliative care f/u as outpatient -prn roxanol Roxanol at discharge for symptomatic dyspnea relief -Home hospice services  #2 recent admission for GI bleed- last week --received 2 units packed RBC transfusion last admission last week.  -Hemoglobin At discharge was 8.2.  - f/u repeat hb today at 9 -Continue soft diet at this time. Outpatient GI follow-up if no acute needs.  #3 CAD status post stent-appears stable. Denies any chest pain. -negative  troponins.  #4 GERD-continue Protonix  #5 hypothyroidism-continue Synthroid  Very anxious to be discharged Palliative care hasn't seen the patient in the hospital. He'll follow up as outpatient Patient will be a good candidate for home hospice services if she accepts. Daughter is agreeable.  DISCHARGE CONDITIONS:   Guarded  CONSULTS OBTAINED:  Treatment Team:  Yevonne Pax, MD  DRUG ALLERGIES:   Allergies  Allergen Reactions  . Codeine Nausea And Vomiting  . Fluticasone-Salmeterol Other (See Comments)    Reaction:  Unknown   . Levofloxacin Other (See Comments)    Reaction:  Confusion and hallucinations   . Tiotropium Bromide Monohydrate Rash    DISCHARGE MEDICATIONS:   Current Discharge Medication List    START taking these medications   Details  amoxicillin-clavulanate (AUGMENTIN) 875-125 MG tablet Take 1 tablet by mouth 2 (two) times daily. X 7 more days Qty: 14 tablet, Refills: 0    Morphine Sulfate (MORPHINE CONCENTRATE) 10 mg / 0.5 ml concentrated solution Take 0.5 mLs (10 mg total) by mouth every 4 (four) hours as needed for severe pain or shortness of breath. Qty: 15 mL, Refills: 0      CONTINUE these medications which have CHANGED   Details  predniSONE (STERAPRED UNI-PAK 21 TAB) 10 MG (21) TBPK tablet Take 1 tablet (10 mg total) by mouth daily. 6 tabs PO x 1 day 5 tabs PO x 1 day 4 tabs PO x 1 day 3 tabs PO x 1 day 2 tabs PO x 1 day 1 tab PO x 1 day and stop Qty: 21 tablet, Refills: 0      CONTINUE  these medications which have NOT CHANGED   Details  acetaminophen (TYLENOL) 500 MG tablet Take 500-1,000 mg by mouth every 6 (six) hours as needed for mild pain, fever or headache.     albuterol (PROVENTIL HFA;VENTOLIN HFA) 108 (90 Base) MCG/ACT inhaler Inhale 2 puffs into the lungs every 4 (four) hours as needed for wheezing or shortness of breath.    atorvastatin (LIPITOR) 40 MG tablet Take 40 mg by mouth at bedtime.    budesonide-formoterol  (SYMBICORT) 80-4.5 MCG/ACT inhaler Inhale 2 puffs into the lungs 2 (two) times daily.    cholecalciferol (VITAMIN D) 1000 UNITS tablet Take 1,000 Units by mouth daily.    docusate sodium (COLACE) 250 MG capsule Take 250 mg by mouth daily.    ferrous sulfate 325 (65 FE) MG tablet Take 325 mg by mouth daily with supper.    fluticasone (FLONASE) 50 MCG/ACT nasal spray Place 2 sprays into both nostrils daily as needed for rhinitis.    furosemide (LASIX) 20 MG tablet Take 1 tablet (20 mg total) by mouth daily. Qty: 90 tablet, Refills: 3   Associated Diagnoses: Chronic congestive heart failure, unspecified congestive heart failure type (HCC)    guaiFENesin (MUCINEX) 600 MG 12 hr tablet Take 1 tablet (600 mg total) by mouth 2 (two) times daily. Qty: 20 tablet, Refills: 0    ipratropium (ATROVENT HFA) 17 MCG/ACT inhaler Inhale 2 puffs into the lungs every 6 (six) hours as needed for wheezing.    ipratropium-albuterol (DUONEB) 0.5-2.5 (3) MG/3ML SOLN Take 3 mLs by nebulization 4 (four) times daily as needed (for wheezing/shortness of breath).     levothyroxine (SYNTHROID, LEVOTHROID) 88 MCG tablet Take 88 mcg by mouth daily before breakfast.    metoprolol succinate (TOPROL-XL) 25 MG 24 hr tablet Take 12.5 mg by mouth daily.    montelukast (SINGULAIR) 10 MG tablet Take 10 mg by mouth at bedtime.    nicotine polacrilex (RA NICOTINE POLACRILEX) 4 MG gum Take 4 mg by mouth as needed for smoking cessation.     pantoprazole (PROTONIX) 40 MG tablet Take 1 tablet (40 mg total) by mouth 2 (two) times daily. Qty: 60 tablet, Refills: 5    polyethylene glycol (MIRALAX / GLYCOLAX) packet Take 17 g by mouth daily.    potassium chloride SA (K-DUR,KLOR-CON) 20 MEQ tablet Take 1 tablet (20 mEq total) by mouth daily. Qty: 30 tablet, Refills: 5    theophylline (THEO-24) 200 MG 24 hr capsule Take 200 mg by mouth daily.      STOP taking these medications     doxycycline (VIBRA-TABS) 100 MG tablet           DISCHARGE INSTRUCTIONS:   1. Home hospice services 2. Palliative care follow-up at home. 3. PCP follow-up in 1-2 weeks  If you experience worsening of your admission symptoms, develop shortness of breath, life threatening emergency, suicidal or homicidal thoughts you must seek medical attention immediately by calling 911 or calling your MD immediately  if symptoms less severe.  You Must read complete instructions/literature along with all the possible adverse reactions/side effects for all the Medicines you take and that have been prescribed to you. Take any new Medicines after you have completely understood and accept all the possible adverse reactions/side effects.   Please note  You were cared for by a hospitalist during your hospital stay. If you have any questions about your discharge medications or the care you received while you were in the hospital after you are discharged, you can  call the unit and asked to speak with the hospitalist on call if the hospitalist that took care of you is not available. Once you are discharged, your primary care physician will handle any further medical issues. Please note that NO REFILLS for any discharge medications will be authorized once you are discharged, as it is imperative that you return to your primary care physician (or establish a relationship with a primary care physician if you do not have one) for your aftercare needs so that they can reassess your need for medications and monitor your lab values.    Today   CHIEF COMPLAINT:   Chief Complaint  Patient presents with  . Shortness of Breath  . Shoulder Pain    VITAL SIGNS:  Blood pressure 120/58, pulse 77, temperature 98.2 F (36.8 C), temperature source Oral, resp. rate 20, height 5\' 5"  (1.651 m), weight 59.875 kg (132 lb), SpO2 96 %.  I/O:   Intake/Output Summary (Last 24 hours) at 06/20/15 1553 Last data filed at 06/20/15 1259  Gross per 24 hour  Intake    644 ml   Output   1550 ml  Net   -906 ml    PHYSICAL EXAMINATION:   Physical Exam  GENERAL: 80 y.o.-year-old patient lying in the bed with no acute distress. Gets dyspneic easily EYES: Pupils equal, round, reactive to light and accommodation. No scleral icterus. Extraocular muscles intact.  HEENT: Head atraumatic, normocephalic. Oropharynx and nasopharynx clear.  NECK: Supple, no jugular venous distention. No thyroid enlargement, no tenderness.  LUNGS: improved coarse wheezing, No rales,rhonchi or crepitation. No use of accessory muscles of respiration. Fine bibasilar crackles. CARDIOVASCULAR: S1, S2 normal. No rubs, or gallops. 3/6 systolic murmur present. ABDOMEN: Soft, nontender, nondistended. Bowel sounds present. No organomegaly or mass.  EXTREMITIES: No pedal edema, cyanosis, or clubbing.  NEUROLOGIC: Cranial nerves II through XII are intact. Muscle strength 5/5 in all extremities. Sensation intact. Gait not checked.  PSYCHIATRIC: The patient is alert and oriented x 3.  SKIN: No obvious rash, lesion, or ulcer.   DATA REVIEW:   CBC  Recent Labs Lab 06/19/15 0438 06/20/15 1047  WBC 8.2  --   HGB 7.5* 9.6*  HCT 23.2*  --   PLT 467*  --     Chemistries   Recent Labs Lab 06/18/15 0545  NA 141  K 3.8  CL 105  CO2 29  GLUCOSE 148*  BUN 19  CREATININE 0.82  CALCIUM 8.6*    Cardiac Enzymes  Recent Labs Lab 06/18/15 0545  TROPONINI <0.03    Microbiology Results  Results for orders placed or performed during the hospital encounter of 06/17/15  Blood culture (routine x 2)     Status: None (Preliminary result)   Collection Time: 06/17/15  3:55 PM  Result Value Ref Range Status   Specimen Description BLOOD UNIDENTIFIED  Final   Special Requests BOTTLES DRAWN AEROBIC AND ANAEROBIC 6CC  Final   Culture NO GROWTH 3 DAYS  Final   Report Status PENDING  Incomplete  Blood culture (routine x 2)     Status: None (Preliminary result)   Collection Time: 06/17/15   4:00 PM  Result Value Ref Range Status   Specimen Description BLOOD RIGHT ARM  Final   Special Requests BOTTLES DRAWN AEROBIC AND ANAEROBIC 6CC  Final   Culture NO GROWTH 3 DAYS  Final   Report Status PENDING  Incomplete  Urine culture     Status: Abnormal   Collection Time: 06/17/15 11:53 PM  Result Value Ref Range Status   Specimen Description URINE, RANDOM  Final   Special Requests NONE  Final   Culture (A)  Final    9,000 COLONIES/mL INSIGNIFICANT GROWTH Performed at [facility]    Report Status 06/19/2015 FINAL  Final    RADIOLOGY:  No results found.  EKG:   Orders placed or performed during the hospital encounter of 06/17/15  . EKG 12-Lead  . EKG 12-Lead  . ED EKG  . ED EKG      Management plans discussed with the patient, family and they are in agreement.  CODE STATUS:     Code Status Orders        Start     Ordered   06/17/15 1804  Full code   Continuous     06/17/15 1803    Code Status History    Date Active Date Inactive Code Status Order ID Comments User Context   06/05/2015 11:42 AM 06/13/2015  5:28 PM DNR [id number]  Ramonita Lab, MD Inpatient   05/20/2015  1:15 PM 05/23/2015  3:18 PM Full Code [id number]  Enid Baas, MD Inpatient   04/25/2015  9:24 PM 04/27/2015  7:37 PM Full Code [id number]  Houston Siren, MD Inpatient   03/04/2015  2:46 PM 03/06/2015  7:15 PM DNR [id number]  Adrian Saran, MD Inpatient   11/14/2014  8:02 PM 11/17/2014  4:44 PM Full Code [id number]  Auburn Bilberry, MD Inpatient    Advance Directive Documentation        Most Recent Value   Type of Advance Directive  Healthcare Power of Attorney   Pre-existing out of facility DNR order (yellow form or pink MOST form)     "MOST" Form in Place?        TOTAL TIME TAKING CARE OF THIS PATIENT: 38 minutes.    Enid Baas M.D on 06/20/2015 at 3:53 PM  Between 7am to 6pm - Pager - [phone]  After 6pm go to [email or web address] - password [email or web address] [facility]  [place] [facility]   Office  [phone]  CC: Primary care physician; Lorie Phenix, MD

## 2015-06-20 NOTE — Progress Notes (Signed)
New referral for [facility] services at home following discharge received from [facility]. Samantha Goodman is a 80 year old woman admitted to [facility] on 6/5 with complaints of shoulder pain and shortness of breath. She was found to be anemic with a hemoglobin of 9, which continued to drop over the next 2 days, she did receive a unit of blood for this. She has had 5 admissions in the past 6 months. She is oxygen dependent at 2 liters via nasal cannula. Writer met in the room with Samantha Goodman as alert and oriented and Patent examiner. She does become dyspneic with conversation, and also note to have dyspnea when taking her medications.  Writer initiated education regarding hospice services, philosophy of and team approach to care with good understanding voiced. She currently has oxygen in place in her home, no DME needs at discharge. Hospice information and contact number left with patient.  Patient information faxed to referral. Plan is for discharge via car today. Hospital care team aware. Thank you for the opportunity to be involved in the care of this patient. Flo Shanks RN, BSN, [place] [facility] [place], [facility] [phone] C

## 2015-06-20 NOTE — Progress Notes (Signed)
[facility] - [place] at [facility]   PATIENT NAME: Samantha KhatMary Boylan    MR#:  [id number]  DATE OF BIRTH:  01/13/1935  SUBJECTIVE:  CHIEF COMPLAINT:   Chief Complaint  Patient presents with  . Shortness of Breath  . Shoulder Pain   - Feels better today, wants to go home - Hb this am 7.5 after she got 1 unit yesterday- redraw advised No active bleeding or dark stools  REVIEW OF SYSTEMS:  Review of Systems  Constitutional: Positive for malaise/fatigue. Negative for fever and chills.  HENT: Negative for ear discharge, ear pain and nosebleeds.   Eyes: Negative for blurred vision and double vision.  Respiratory: Positive for shortness of breath and wheezing. Negative for cough.   Cardiovascular: Negative for chest pain and palpitations.  Gastrointestinal: Negative for nausea, vomiting, abdominal pain, diarrhea and constipation.  Genitourinary: Negative for dysuria and urgency.  Musculoskeletal: Negative for myalgias and neck pain.  Neurological: Negative for dizziness, seizures and headaches.  Psychiatric/Behavioral: Negative for depression. The patient is nervous/anxious.     DRUG ALLERGIES:   Allergies  Allergen Reactions  . Codeine Nausea And Vomiting  . Fluticasone-Salmeterol Other (See Comments)    Reaction:  Unknown   . Levofloxacin Other (See Comments)    Reaction:  Confusion and hallucinations   . Tiotropium Bromide Monohydrate Rash    VITALS:  Blood pressure 116/53, pulse 76, temperature 97.7 F (36.5 C), temperature source Oral, resp. rate 20, height 5\' 5"  (1.651 m), weight 59.875 kg (132 lb), SpO2 95 %.  PHYSICAL EXAMINATION:  Physical Exam  GENERAL: 80 y.o.-year-old patient lying in the bed with no acute distress.  EYES: Pupils equal, round, reactive to light and accommodation. No scleral icterus. Extraocular muscles intact.  HEENT: Head atraumatic, normocephalic. Oropharynx and nasopharynx clear.  NECK: Supple, no jugular venous  distention. No thyroid enlargement, no tenderness.  LUNGS: improved coarse wheezing, No rales,rhonchi or crepitation. No use of accessory muscles of respiration. Fine bibasilar crackles. CARDIOVASCULAR: S1, S2 normal. No rubs, or gallops. 3/6 systolic murmur present. ABDOMEN: Soft, nontender, nondistended. Bowel sounds present. No organomegaly or mass.  EXTREMITIES: No pedal edema, cyanosis, or clubbing.  NEUROLOGIC: Cranial nerves II through XII are intact. Muscle strength 5/5 in all extremities. Sensation intact. Gait not checked.  PSYCHIATRIC: The patient is alert and oriented x 3.  SKIN: No obvious rash, lesion, or ulcer.   LABORATORY PANEL:   CBC  Recent Labs Lab 06/19/15 0438  WBC 8.2  HGB 7.5*  HCT 23.2*  PLT 467*   ------------------------------------------------------------------------------------------------------------------  Chemistries   Recent Labs Lab 06/18/15 0545  NA 141  K 3.8  CL 105  CO2 29  GLUCOSE 148*  BUN 19  CREATININE 0.82  CALCIUM 8.6*   ------------------------------------------------------------------------------------------------------------------  Cardiac Enzymes  Recent Labs Lab 06/18/15 0545  TROPONINI <0.03   ------------------------------------------------------------------------------------------------------------------  RADIOLOGY:  No results found.  EKG:   Orders placed or performed during the hospital encounter of 06/17/15  . EKG 12-Lead  . EKG 12-Lead  . ED EKG  . ED EKG    ASSESSMENT AND PLAN:   Samantha KhatMary Weng is a 80 y.o. female with a known history of CAD status post stent, COPD on 2 L home oxygen, tobacco use disorder, congestive heart failure with diastolic dysfunction, hypothyroidism presents to the hospital secondary to worsening shortness of breath.  #1 COPD exacerbation and healthcare acquired pneumonia- -Negative blood cultures so far. Continue steroids, duo nebs, inhalers -was on  vancomycin and  Zosyn. Change to augmentin at discharge - Appreciate Pulmonary consult. -Patient states that she just has quit smoking. Continue nicotine supplements as needed. -Palliative care f/u as outpatient -prn roxanol Roxanol at discharge for symptomatic dyspnea relief - physical therapy consulted- home health if patient accepts  #2 recent admission for GI bleed- last week --received 2 units packed RBC transfusion last admission last week.  -Hemoglobin At discharge was 8.2.  - f/u repeat hb today -Continue soft diet at this time. Outpatient GI follow-up if no acute needs.  #3 CAD status post stent-appears stable. Denies any chest pain. -negative troponins.  #4 GERD-continue Protonix  #5 hypothyroidism-continue Synthroid  #6 DVT prophylaxis-on Lovenox   Very anxious to be discharged Palliative care hasn't seen the patient yet I have discussed possibility of hospice services at home for symptomatic treatment as her life expectancy might be down to few months now (could be 6 months)  Care manager consult, hospice screen   All the records are reviewed and case discussed with Care Management/Social Workerr. Management plans discussed with the patient, family and they are in agreement.  CODE STATUS: Full Code  TOTAL TIME TAKING CARE OF THIS PATIENT: 37 minutes.   POSSIBLE D/C IN 2 DAYS, DEPENDING ON CLINICAL CONDITION.   Enid Baas M.D on 06/20/2015 at 11:01 AM  Between 7am to 6pm - Pager - [phone]  After 6pm go to [email or web address] - password [email or web address] [facility]  [place] [facility]  Office  [phone]  CC: Primary care physician; Lorie Phenix, MD

## 2015-06-20 NOTE — Progress Notes (Signed)
Pt stable. IV removed. D/c instructions given and education provided. Signed prescriptions verified and given. Pt states she understands instructions. Pt dressed and escorted out by staff. Driven home by family.  

## 2015-06-22 LAB — CULTURE, BLOOD (ROUTINE X 2)
CULTURE: NO GROWTH
Culture: NO GROWTH

## 2015-06-27 ENCOUNTER — Other Ambulatory Visit: Payer: Self-pay | Admitting: Family Medicine

## 2015-06-27 NOTE — Telephone Encounter (Signed)
Sarah with hospice requested refill for patient on her Morphine Sulfate (MORPHINE CONCENTRATE) 10 mg / 0.5 ml concentrated solution  [facility]  [phone]  Hospice callback is [phone]   Thanks Barth Kirkseri

## 2015-06-28 ENCOUNTER — Telehealth: Payer: Self-pay | Admitting: Family Medicine

## 2015-06-28 MED ORDER — MORPHINE SULFATE (CONCENTRATE) 10 MG /0.5 ML PO SOLN: 10.0000 mg | ORAL | Status: AC | PRN

## 2015-06-28 NOTE — Telephone Encounter (Signed)
Pain and symptom mgmt protocol, skin care protocol and comfort kit for Dr.Fisher to sign off on.  They faxed on June 9th and have not received it back.  They are re faxing and requesting the forms be signed off on

## 2015-07-01 ENCOUNTER — Ambulatory Visit (INDEPENDENT_AMBULATORY_CARE_PROVIDER_SITE_OTHER): Payer: Medicare Other | Admitting: Family Medicine

## 2015-07-01 VITALS — BP 98/52 | HR 80 | Temp 97.5°F | Resp 32 | Wt 137.0 lb

## 2015-07-01 DIAGNOSIS — M25519 Pain in unspecified shoulder: Secondary | ICD-10-CM | POA: Insufficient documentation

## 2015-07-01 DIAGNOSIS — J410 Simple chronic bronchitis: Secondary | ICD-10-CM | POA: Diagnosis not present

## 2015-07-01 MED ORDER — PREDNISONE 10 MG PO TABS
10.0000 mg | ORAL_TABLET | Freq: Every day | ORAL | Status: AC
Start: 2015-07-01 — End: ?

## 2015-07-01 NOTE — Progress Notes (Signed)
Patient: Samantha Goodman Female    DOB: 1935-04-27   80 y.o.   MRN: [id number] Visit Date: 07/01/2015  Today's Provider: Lorie Phenix, MD   Chief Complaint  Patient presents with  . Hospitalization Follow-up   Subjective:    HPI  Follow up Hospitalization  Patient was admitted to [facility] on 06/17/2015 and discharged on 06/20/2015. She was treated for pneumonia, sepsis. Treatment for this included starting Augmentin, Morphine, and continuing Prednisone. She reports excellent compliance with treatment. She reports this condition is Unchanged. Pt still c/o SOB. Pt's hospice nurse would like to inquire about pt starting daily Prednisone to prevent pneumonia and COPD exacerbations. ------------------------------------------------------------------------------------      Allergies  Allergen Reactions  . Codeine Nausea And Vomiting  . Fluticasone-Salmeterol Other (See Comments)    Reaction:  Unknown   . Levofloxacin Other (See Comments)    Reaction:  Confusion and hallucinations   . Tiotropium Bromide Monohydrate Rash   Current Meds  Medication Sig  . acetaminophen (TYLENOL) 500 MG tablet Take 500-1,000 mg by mouth every 6 (six) hours as needed for mild pain, fever or headache.   . albuterol (PROVENTIL HFA;VENTOLIN HFA) 108 (90 Base) MCG/ACT inhaler Inhale 2 puffs into the lungs every 4 (four) hours as needed for wheezing or shortness of breath.  Marland Kitchen atorvastatin (LIPITOR) 40 MG tablet Take 40 mg by mouth at bedtime.  . budesonide-formoterol (SYMBICORT) 80-4.5 MCG/ACT inhaler Inhale 2 puffs into the lungs 2 (two) times daily.  . cholecalciferol (VITAMIN D) 1000 UNITS tablet Take 1,000 Units by mouth daily.  Marland Kitchen docusate sodium (COLACE) 250 MG capsule Take 250 mg by mouth daily.  . ferrous sulfate 325 (65 FE) MG tablet Take 325 mg by mouth daily with supper.  . fluticasone (FLONASE) 50 MCG/ACT nasal spray Place 2 sprays into both nostrils daily as needed for rhinitis.  .  furosemide (LASIX) 20 MG tablet Take 1 tablet (20 mg total) by mouth daily.  Marland Kitchen guaiFENesin (MUCINEX) 600 MG 12 hr tablet Take 1 tablet (600 mg total) by mouth 2 (two) times daily.  Marland Kitchen ipratropium (ATROVENT HFA) 17 MCG/ACT inhaler Inhale 2 puffs into the lungs every 6 (six) hours as needed for wheezing.  Marland Kitchen ipratropium-albuterol (DUONEB) 0.5-2.5 (3) MG/3ML SOLN Take 3 mLs by nebulization 4 (four) times daily as needed (for wheezing/shortness of breath).   Marland Kitchen levothyroxine (SYNTHROID, LEVOTHROID) 88 MCG tablet Take 88 mcg by mouth daily before breakfast.  . LORazepam (ATIVAN) 0.5 MG tablet Take 0.25 mg by mouth every 6 (six) hours as needed for anxiety.  . metoprolol succinate (TOPROL-XL) 25 MG 24 hr tablet Take 12.5 mg by mouth daily.  . montelukast (SINGULAIR) 10 MG tablet Take 10 mg by mouth at bedtime.  . Morphine Sulfate (MORPHINE CONCENTRATE) 10 mg / 0.5 ml concentrated solution Take 0.5 mLs (10 mg total) by mouth every 4 (four) hours as needed for severe pain or shortness of breath.  . nicotine polacrilex (RA NICOTINE POLACRILEX) 4 MG gum Take 4 mg by mouth as needed for smoking cessation.   . pantoprazole (PROTONIX) 40 MG tablet Take 1 tablet (40 mg total) by mouth 2 (two) times daily.  . polyethylene glycol (MIRALAX / GLYCOLAX) packet Take 17 g by mouth daily.  . potassium chloride SA (K-DUR,KLOR-CON) 20 MEQ tablet Take 1 tablet (20 mEq total) by mouth daily.  . theophylline (THEO-24) 200 MG 24 hr capsule Take 200 mg by mouth daily.    Review of Systems  Constitutional: Positive for activity change. Negative for fever, chills, diaphoresis, appetite change, fatigue and unexpected weight change.  Respiratory: Positive for cough, shortness of breath and wheezing.   Cardiovascular: Negative for chest pain, palpitations and leg swelling.    Social History  Substance Use Topics  . Smoking status: Former Smoker -- 0.25 packs/day for 65 years    Quit date: 11/11/2013  . Smokeless tobacco: Never  Used     Comment: Quit smoking recently  . Alcohol Use: No   Objective:   BP 98/52 mmHg  Pulse 80  Temp(Src) 97.5 F (36.4 C) (Oral)  Resp 32  Wt 137 lb (62.143 kg)  SpO2 95% on  2.5 L O2. Physical Exam  Constitutional: She is oriented to person, place, and time. She appears well-developed and well-nourished.  Pulmonary/Chest:  Decreased breath sounds.  Musculoskeletal: Normal range of motion. She exhibits tenderness (right shoulder).  Neurological: She is alert and oriented to person, place, and time.  Psychiatric: She has a normal mood and affect. Her behavior is normal.      Assessment & Plan:     1. Shoulder pain, unspecified laterality Worsening. Has appointment fir this at the beginning of next month. Keep appointment as scheduled.  2. Simple chronic bronchitis (HCC) (COPD) Worsening. Will start Prednisone 10 mg po qd. FU with pulmonology as scheduled. - predniSONE (DELTASONE) 10 MG tablet; Take 1 tablet (10 mg total) by mouth daily with breakfast.  Dispense: 30 tablet; Refill: 2     Patient seen and examined by Leo GrosserNancy J. Jaxen Samples, MD, and note scribed by Allene DillonEmily Drozdowski, CMA.  I have reviewed the document for accuracy and completeness and I agree with above. - Leo GrosserNancy J. Britiny Defrain, MD   Lorie PhenixNancy Cam Dauphin, MD  [facility]

## 2015-07-03 ENCOUNTER — Encounter: Payer: Self-pay | Admitting: Family Medicine

## 2015-07-04 DIAGNOSIS — M7552 Bursitis of left shoulder: Secondary | ICD-10-CM | POA: Diagnosis not present

## 2015-07-09 ENCOUNTER — Telehealth: Payer: Self-pay | Admitting: Family Medicine

## 2015-07-09 NOTE — Telephone Encounter (Signed)
Samantha Goodman with [facility] would like a nurse to return her call about an order that was faxed today. She would like to get the order back before Dr. Elease HashimotoMaloney leaves the office. Please advise. Thanks TNP

## 2015-07-11 ENCOUNTER — Other Ambulatory Visit: Payer: Self-pay

## 2015-07-11 DIAGNOSIS — J410 Simple chronic bronchitis: Secondary | ICD-10-CM

## 2015-07-11 MED ORDER — PREDNISONE 10 MG PO TABS: ORAL_TABLET | ORAL | Status: AC

## 2015-07-12 DIAGNOSIS — Z9989 Dependence on other enabling machines and devices: Secondary | ICD-10-CM | POA: Diagnosis not present

## 2015-07-12 DIAGNOSIS — J449 Chronic obstructive pulmonary disease, unspecified: Secondary | ICD-10-CM | POA: Diagnosis not present

## 2015-08-13 DEATH — deceased

## 2015-08-23 ENCOUNTER — Ambulatory Visit: Payer: Medicare Other | Admitting: Family Medicine

## 2017-04-08 IMAGING — CT CT ANGIO CHEST
2 of 6 series · 18 of 46 positions shown · IV contrast (APPLIED)
Comparison: Chest radiographs obtained earlier today and chest CT
dated 03/05/2015. Abdomen CT dated 02/09/2014.

CLINICAL DATA: Left shoulder pain and shortness of breath for the
past 3 days. Coronary artery disease with stent placement.
Ex-smoker.

EXAM:
CT ANGIOGRAPHY CHEST WITH CONTRAST
TECHNIQUE: Multidetector CT imaging of the chest was performed using the
standard protocol during bolus administration of intravenous
contrast. Multiplanar CT image reconstructions and MIPs were
obtained to evaluate the vascular anatomy.
CONTRAST:  60 cc Isovue 370

[Series 5: pe thins 1.5 · axial · 0.68mm/px · z∈[-265,-9]mm · 15 of 239 slices shown]
[im 13/239  lung]
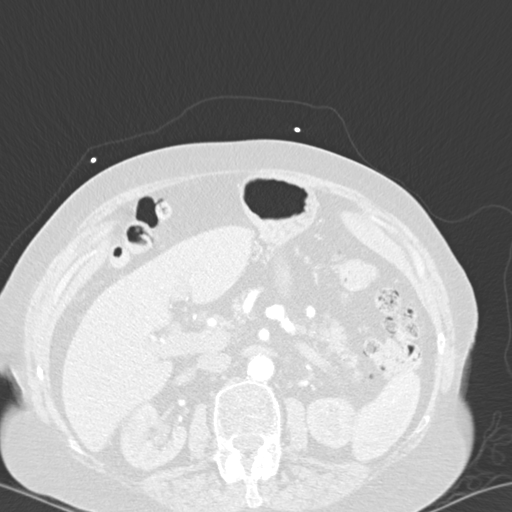
[im 26/239  soft-tissue]
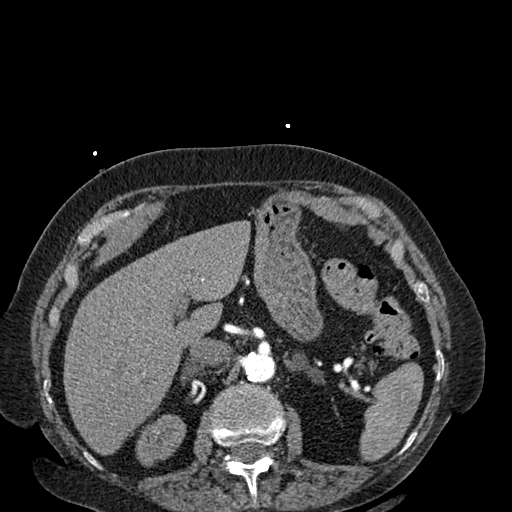
[im 51/239  lung]
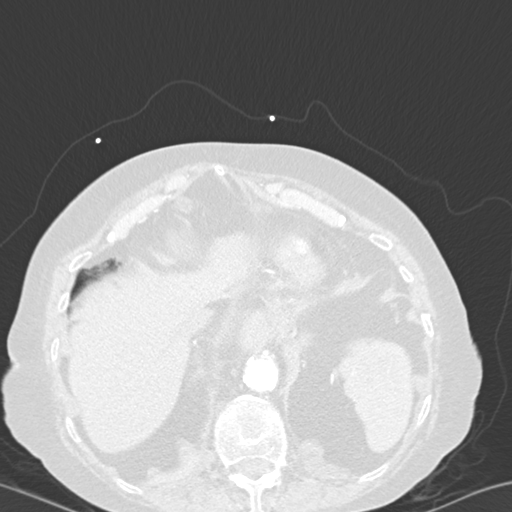
[im 63/239  soft-tissue]
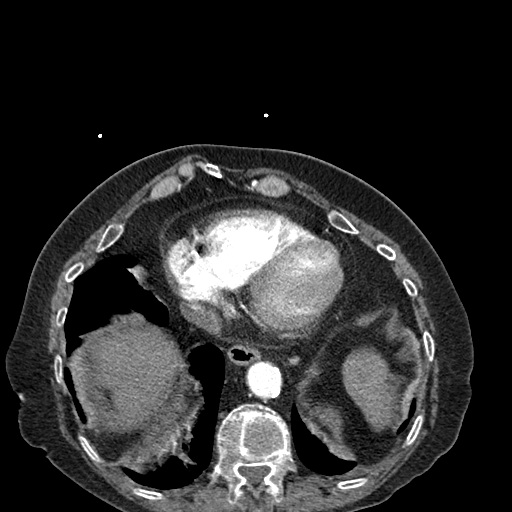
[im 76/239  lung]
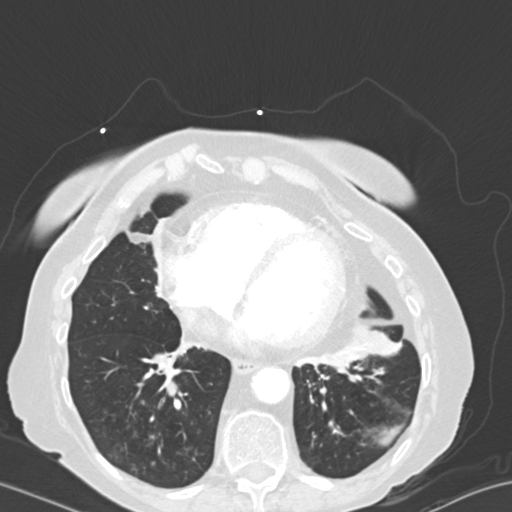
[im 88/239  soft-tissue]
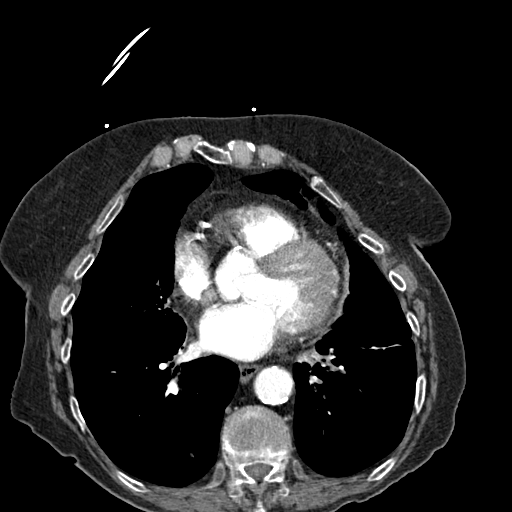
[im 101/239  lung]
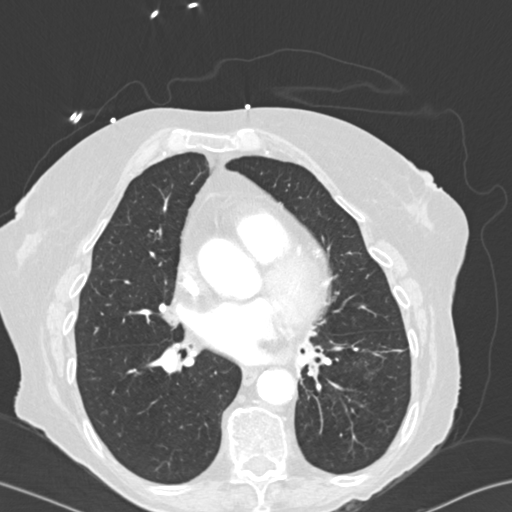
[im 126/239  soft-tissue]
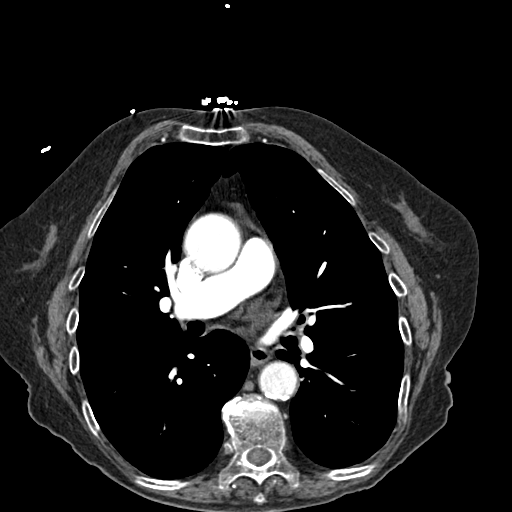
[im 138/239  lung]
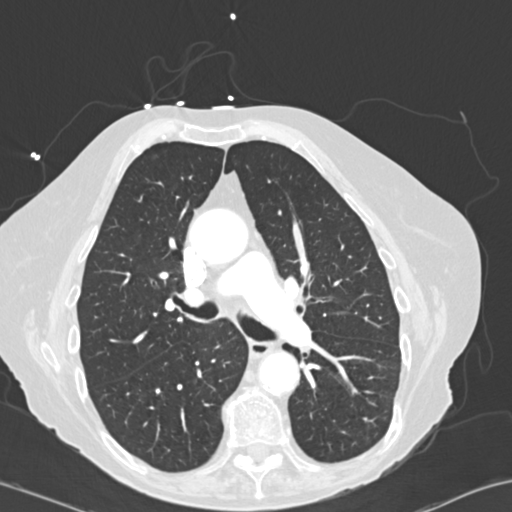
[im 151/239  soft-tissue]
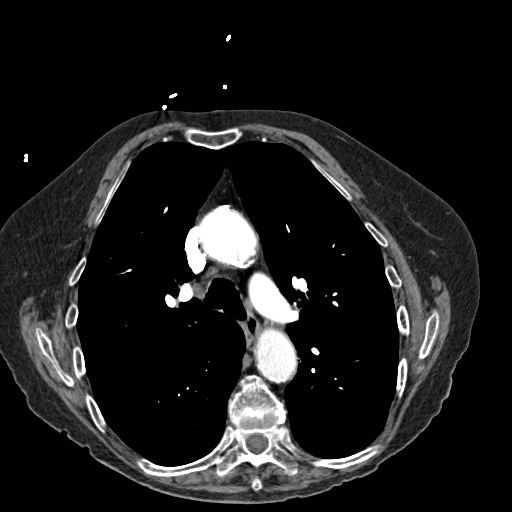
[im 163/239  lung]
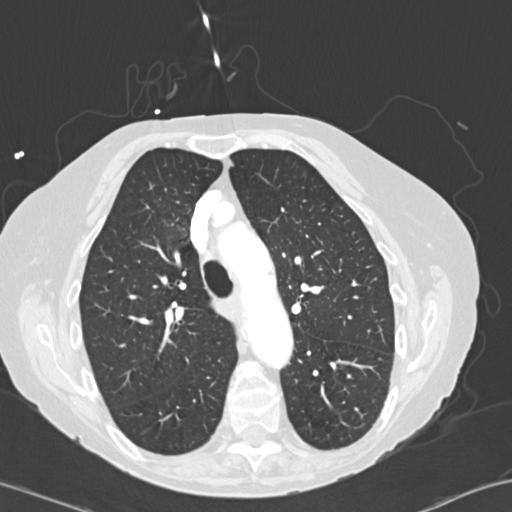
[im 176/239  soft-tissue]
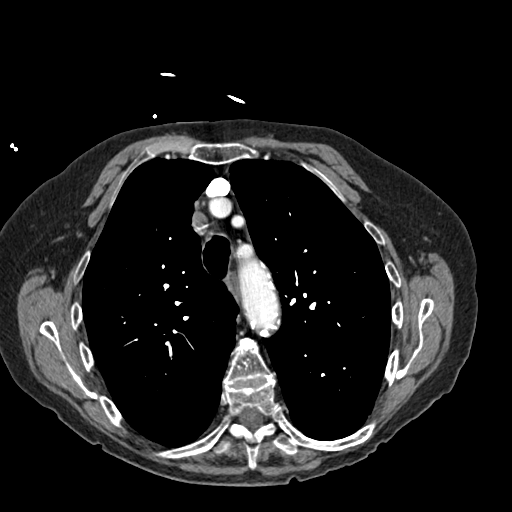
[im 201/239  lung]
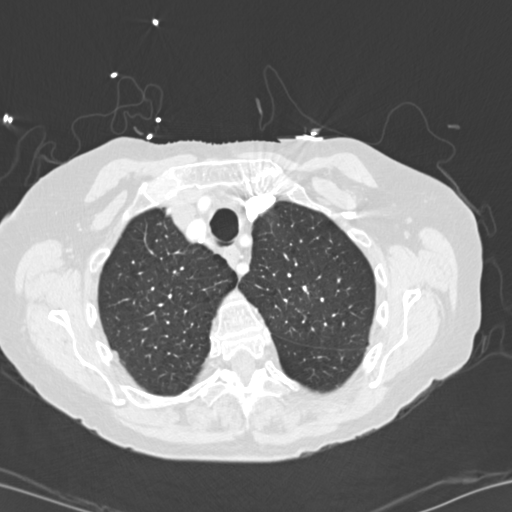
[im 213/239  soft-tissue]
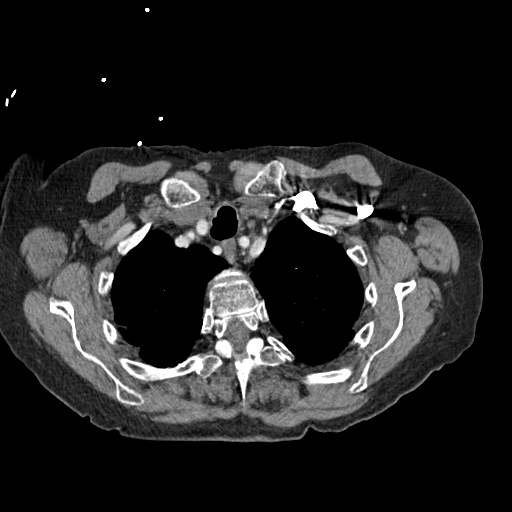
[im 226/239  lung]
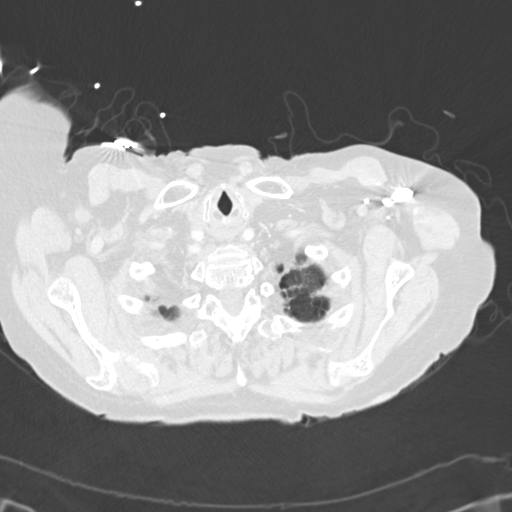

[Series 7: cor mpr 2.0 · coronal · 0.54mm/px · 3 of 139 slices shown]
[im 35/139  soft-tissue]
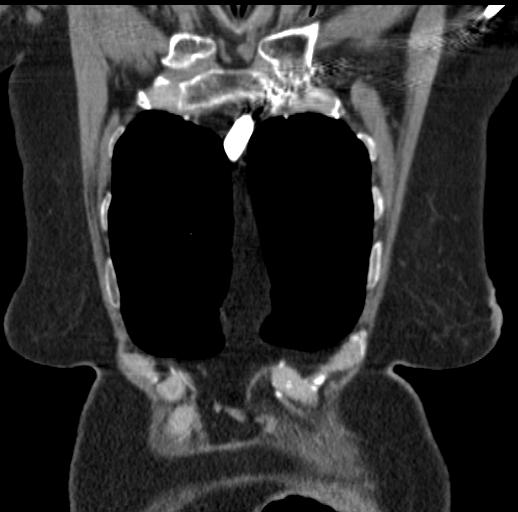
[im 70/139  soft-tissue]
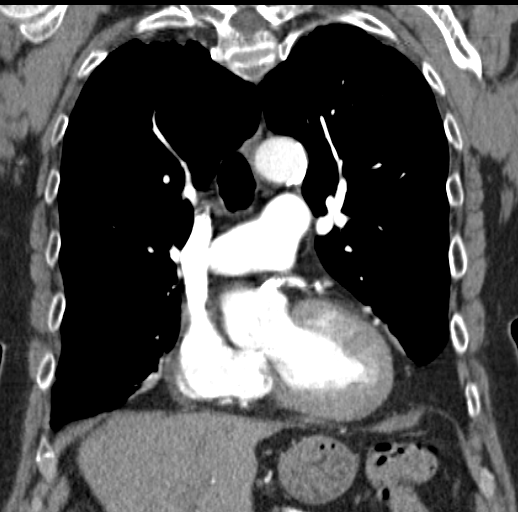
[im 104/139  soft-tissue]
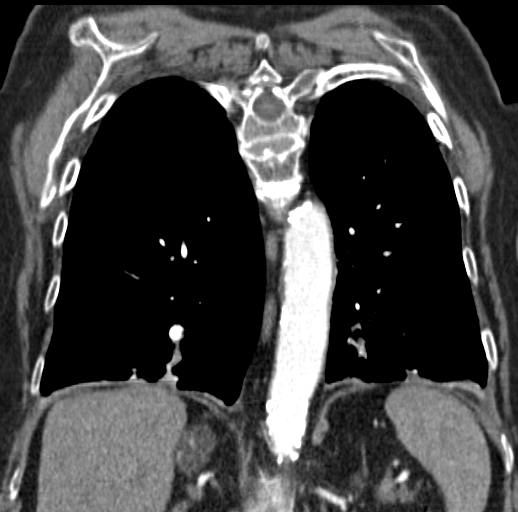

[18 of 46 positions shown; findings below may reference images not displayed]

FINDINGS: Mediastinum/Lymph Nodes: Normally opacified pulmonary arteries with
no pulmonary arterial filling defects seen. Atheromatous coronary
artery calcifications. No enlarged lymph nodes.

Lungs/Pleura: Biapical pleural and parenchymal scarring. Interval
tree in bud opacities in the right lower lobe. Interval similar
changes, to a lesser degree, in the left lower lobe. The previously
demonstrated 5 mm irregular density in the left lower lobe is 7 mm
in diameter on image number 75, previously 7 mm in corresponding
diameter.

Upper abdomen: Stable low density right adrenal nodule. This
measures 4.0 x 2.0 cm on image number 120 of series 4 and previously
measured -5 Hounsfield units in density on the noncontrast study
dated 03/05/2015. A similar appearing in similar density nodule in
the left adrenal gland is also unchanged, measuring 2.9 x 1.3 cm on
image number 132 of series 4. These have also not changed
significantly since 02/09/2014.

Musculoskeletal: No significant change in multiple vertebral
compression deformities without acute fracture lines. Mild bony
retropulsion at the T12 level is unchanged.

Review of the MIP images confirms the above findings.
IMPRESSION: 1. Interval the tree in bud opacities in both lower lobes, greater
on the right. These are concerning for active infection.
2. No pulmonary emboli.
3. Stable 7 mm irregular nodule in the left lower lobe. Non-contrast
chest CT at 6 months is recommended. If the nodules are stable at
time of repeat CT, then future CT at 18-24 months (from today's
scan) is considered optional for low-risk patients, but is
recommended for high-risk patients. This recommendation follows the
consensus statement: Guidelines for Management of Incidental
Pulmonary Nodules Detected on CT Images:From the [HOSPITAL]
5037; published online before print (10.1148/radiol.[id number]).
4. Stable bilateral adrenal adenomas.
